# Patient Record
Sex: Female | Born: 1960 | Race: White | Hispanic: No | Marital: Married | Smoking: Former smoker
Health system: Southern US, Community
[De-identification: ages and names within clinical notes are randomized; demographics above are authoritative.]

## PROBLEM LIST (undated history)

## (undated) DIAGNOSIS — M25561 Pain in right knee: Secondary | ICD-10-CM

## (undated) DIAGNOSIS — K219 Gastro-esophageal reflux disease without esophagitis: Secondary | ICD-10-CM

## (undated) DIAGNOSIS — G894 Chronic pain syndrome: Secondary | ICD-10-CM

## (undated) DIAGNOSIS — E119 Type 2 diabetes mellitus without complications: Secondary | ICD-10-CM

## (undated) DIAGNOSIS — M25562 Pain in left knee: Secondary | ICD-10-CM

## (undated) DIAGNOSIS — M549 Dorsalgia, unspecified: Secondary | ICD-10-CM

## (undated) DIAGNOSIS — F17200 Nicotine dependence, unspecified, uncomplicated: Secondary | ICD-10-CM

## (undated) DIAGNOSIS — I700 Atherosclerosis of aorta: Secondary | ICD-10-CM

## (undated) DIAGNOSIS — Z79891 Long term (current) use of opiate analgesic: Secondary | ICD-10-CM

## (undated) DIAGNOSIS — R600 Localized edema: Secondary | ICD-10-CM

## (undated) DIAGNOSIS — D649 Anemia, unspecified: Secondary | ICD-10-CM

## (undated) DIAGNOSIS — I10 Essential (primary) hypertension: Secondary | ICD-10-CM

## (undated) DIAGNOSIS — I723 Aneurysm of iliac artery: Secondary | ICD-10-CM

## (undated) DIAGNOSIS — M1990 Unspecified osteoarthritis, unspecified site: Secondary | ICD-10-CM

## (undated) DIAGNOSIS — M4716 Other spondylosis with myelopathy, lumbar region: Secondary | ICD-10-CM

## (undated) DIAGNOSIS — M5116 Intervertebral disc disorders with radiculopathy, lumbar region: Secondary | ICD-10-CM

## (undated) HISTORY — DX: Gastro-esophageal reflux disease without esophagitis: K21.9

## (undated) HISTORY — PX: TUBAL LIGATION: SHX77

## (undated) HISTORY — DX: Type 2 diabetes mellitus without complications: E11.9

## (undated) HISTORY — PX: KNEE SURGERY: SHX244

## (undated) HISTORY — PX: BREAST BIOPSY: SHX20

## (undated) HISTORY — PX: TONSILLECTOMY: SUR1361

## (undated) HISTORY — DX: Essential (primary) hypertension: I10

## (undated) HISTORY — DX: Unspecified osteoarthritis, unspecified site: M19.90

## (undated) HISTORY — PX: CHOLECYSTECTOMY: SHX55

## (undated) HISTORY — PX: COLOSTOMY REVERSAL: SHX5782

## (undated) HISTORY — PX: COLON SURGERY: SHX602

---

## 2005-09-03 ENCOUNTER — Emergency Department: Payer: Self-pay | Admitting: Emergency Medicine

## 2005-09-15 ENCOUNTER — Ambulatory Visit: Payer: Self-pay | Admitting: Orthopedic Surgery

## 2007-05-24 ENCOUNTER — Ambulatory Visit: Payer: Self-pay | Admitting: Internal Medicine

## 2009-10-05 ENCOUNTER — Ambulatory Visit: Payer: Self-pay | Admitting: Internal Medicine

## 2012-12-27 ENCOUNTER — Ambulatory Visit: Payer: Self-pay

## 2012-12-27 LAB — RAPID INFLUENZA A&B ANTIGENS

## 2014-12-25 ENCOUNTER — Ambulatory Visit: Payer: Self-pay

## 2014-12-25 LAB — RAPID STREP-A WITH REFLX: MICRO TEXT REPORT: NEGATIVE

## 2014-12-28 LAB — BETA STREP CULTURE(ARMC)

## 2017-02-03 DIAGNOSIS — M25561 Pain in right knee: Secondary | ICD-10-CM | POA: Diagnosis not present

## 2017-02-03 DIAGNOSIS — M4716 Other spondylosis with myelopathy, lumbar region: Secondary | ICD-10-CM | POA: Diagnosis not present

## 2017-02-03 DIAGNOSIS — M5116 Intervertebral disc disorders with radiculopathy, lumbar region: Secondary | ICD-10-CM | POA: Diagnosis not present

## 2017-02-03 DIAGNOSIS — G894 Chronic pain syndrome: Secondary | ICD-10-CM | POA: Diagnosis not present

## 2017-02-03 DIAGNOSIS — M25562 Pain in left knee: Secondary | ICD-10-CM | POA: Diagnosis not present

## 2017-02-03 DIAGNOSIS — Z79899 Other long term (current) drug therapy: Secondary | ICD-10-CM | POA: Diagnosis not present

## 2017-03-31 DIAGNOSIS — M5116 Intervertebral disc disorders with radiculopathy, lumbar region: Secondary | ICD-10-CM | POA: Diagnosis not present

## 2017-03-31 DIAGNOSIS — M25562 Pain in left knee: Secondary | ICD-10-CM | POA: Diagnosis not present

## 2017-03-31 DIAGNOSIS — G894 Chronic pain syndrome: Secondary | ICD-10-CM | POA: Diagnosis not present

## 2017-03-31 DIAGNOSIS — M25561 Pain in right knee: Secondary | ICD-10-CM | POA: Diagnosis not present

## 2017-03-31 DIAGNOSIS — Z79899 Other long term (current) drug therapy: Secondary | ICD-10-CM | POA: Diagnosis not present

## 2017-03-31 DIAGNOSIS — M4716 Other spondylosis with myelopathy, lumbar region: Secondary | ICD-10-CM | POA: Diagnosis not present

## 2017-05-26 DIAGNOSIS — M5116 Intervertebral disc disorders with radiculopathy, lumbar region: Secondary | ICD-10-CM | POA: Diagnosis not present

## 2017-05-26 DIAGNOSIS — M25561 Pain in right knee: Secondary | ICD-10-CM | POA: Diagnosis not present

## 2017-05-26 DIAGNOSIS — Z79891 Long term (current) use of opiate analgesic: Secondary | ICD-10-CM | POA: Diagnosis not present

## 2017-05-26 DIAGNOSIS — M4716 Other spondylosis with myelopathy, lumbar region: Secondary | ICD-10-CM | POA: Diagnosis not present

## 2017-05-26 DIAGNOSIS — M25562 Pain in left knee: Secondary | ICD-10-CM | POA: Diagnosis not present

## 2017-05-26 DIAGNOSIS — G894 Chronic pain syndrome: Secondary | ICD-10-CM | POA: Diagnosis not present

## 2017-05-26 DIAGNOSIS — Z79899 Other long term (current) drug therapy: Secondary | ICD-10-CM | POA: Diagnosis not present

## 2017-06-30 DIAGNOSIS — M5116 Intervertebral disc disorders with radiculopathy, lumbar region: Secondary | ICD-10-CM | POA: Diagnosis not present

## 2017-07-21 DIAGNOSIS — M25561 Pain in right knee: Secondary | ICD-10-CM | POA: Diagnosis not present

## 2017-07-21 DIAGNOSIS — Z79899 Other long term (current) drug therapy: Secondary | ICD-10-CM | POA: Diagnosis not present

## 2017-07-21 DIAGNOSIS — M4716 Other spondylosis with myelopathy, lumbar region: Secondary | ICD-10-CM | POA: Diagnosis not present

## 2017-07-21 DIAGNOSIS — M25562 Pain in left knee: Secondary | ICD-10-CM | POA: Diagnosis not present

## 2017-07-21 DIAGNOSIS — M5116 Intervertebral disc disorders with radiculopathy, lumbar region: Secondary | ICD-10-CM | POA: Diagnosis not present

## 2017-07-21 DIAGNOSIS — Z79891 Long term (current) use of opiate analgesic: Secondary | ICD-10-CM | POA: Diagnosis not present

## 2017-07-21 DIAGNOSIS — G894 Chronic pain syndrome: Secondary | ICD-10-CM | POA: Diagnosis not present

## 2017-09-21 DIAGNOSIS — G894 Chronic pain syndrome: Secondary | ICD-10-CM | POA: Diagnosis not present

## 2017-09-21 DIAGNOSIS — Z79899 Other long term (current) drug therapy: Secondary | ICD-10-CM | POA: Diagnosis not present

## 2017-09-21 DIAGNOSIS — Z79891 Long term (current) use of opiate analgesic: Secondary | ICD-10-CM | POA: Diagnosis not present

## 2017-09-21 DIAGNOSIS — M5116 Intervertebral disc disorders with radiculopathy, lumbar region: Secondary | ICD-10-CM | POA: Diagnosis not present

## 2017-09-21 DIAGNOSIS — M25561 Pain in right knee: Secondary | ICD-10-CM | POA: Diagnosis not present

## 2017-09-21 DIAGNOSIS — M4716 Other spondylosis with myelopathy, lumbar region: Secondary | ICD-10-CM | POA: Diagnosis not present

## 2017-09-21 DIAGNOSIS — M25562 Pain in left knee: Secondary | ICD-10-CM | POA: Diagnosis not present

## 2017-10-20 DIAGNOSIS — M545 Low back pain: Secondary | ICD-10-CM | POA: Diagnosis not present

## 2017-11-24 DIAGNOSIS — M5116 Intervertebral disc disorders with radiculopathy, lumbar region: Secondary | ICD-10-CM | POA: Diagnosis not present

## 2017-11-24 DIAGNOSIS — M25561 Pain in right knee: Secondary | ICD-10-CM | POA: Diagnosis not present

## 2017-11-24 DIAGNOSIS — M4716 Other spondylosis with myelopathy, lumbar region: Secondary | ICD-10-CM | POA: Diagnosis not present

## 2017-11-24 DIAGNOSIS — M25562 Pain in left knee: Secondary | ICD-10-CM | POA: Diagnosis not present

## 2017-11-24 DIAGNOSIS — Z79899 Other long term (current) drug therapy: Secondary | ICD-10-CM | POA: Diagnosis not present

## 2017-11-24 DIAGNOSIS — Z79891 Long term (current) use of opiate analgesic: Secondary | ICD-10-CM | POA: Diagnosis not present

## 2017-11-24 DIAGNOSIS — G894 Chronic pain syndrome: Secondary | ICD-10-CM | POA: Diagnosis not present

## 2018-01-19 DIAGNOSIS — M4716 Other spondylosis with myelopathy, lumbar region: Secondary | ICD-10-CM | POA: Diagnosis not present

## 2018-01-19 DIAGNOSIS — Z79899 Other long term (current) drug therapy: Secondary | ICD-10-CM | POA: Diagnosis not present

## 2018-01-19 DIAGNOSIS — Z79891 Long term (current) use of opiate analgesic: Secondary | ICD-10-CM | POA: Diagnosis not present

## 2018-01-19 DIAGNOSIS — M5116 Intervertebral disc disorders with radiculopathy, lumbar region: Secondary | ICD-10-CM | POA: Diagnosis not present

## 2018-01-19 DIAGNOSIS — G894 Chronic pain syndrome: Secondary | ICD-10-CM | POA: Diagnosis not present

## 2018-01-19 DIAGNOSIS — M25562 Pain in left knee: Secondary | ICD-10-CM | POA: Diagnosis not present

## 2018-01-19 DIAGNOSIS — M25561 Pain in right knee: Secondary | ICD-10-CM | POA: Diagnosis not present

## 2018-02-02 DIAGNOSIS — M545 Low back pain: Secondary | ICD-10-CM | POA: Diagnosis not present

## 2018-03-16 DIAGNOSIS — G894 Chronic pain syndrome: Secondary | ICD-10-CM | POA: Diagnosis not present

## 2018-03-16 DIAGNOSIS — M25561 Pain in right knee: Secondary | ICD-10-CM | POA: Diagnosis not present

## 2018-03-16 DIAGNOSIS — Z79891 Long term (current) use of opiate analgesic: Secondary | ICD-10-CM | POA: Diagnosis not present

## 2018-03-16 DIAGNOSIS — M4716 Other spondylosis with myelopathy, lumbar region: Secondary | ICD-10-CM | POA: Diagnosis not present

## 2018-03-16 DIAGNOSIS — M5116 Intervertebral disc disorders with radiculopathy, lumbar region: Secondary | ICD-10-CM | POA: Diagnosis not present

## 2018-03-16 DIAGNOSIS — Z79899 Other long term (current) drug therapy: Secondary | ICD-10-CM | POA: Diagnosis not present

## 2018-03-16 DIAGNOSIS — M25562 Pain in left knee: Secondary | ICD-10-CM | POA: Diagnosis not present

## 2018-04-05 ENCOUNTER — Ambulatory Visit
Admission: EM | Admit: 2018-04-05 | Discharge: 2018-04-05 | Disposition: A | Discharge status: Home / Self Care | Payer: PPO | Attending: Emergency Medicine | Admitting: Emergency Medicine

## 2018-04-05 ENCOUNTER — Other Ambulatory Visit: Payer: Self-pay

## 2018-04-05 ENCOUNTER — Ambulatory Visit (INDEPENDENT_AMBULATORY_CARE_PROVIDER_SITE_OTHER): Payer: PPO

## 2018-04-05 DIAGNOSIS — R739 Hyperglycemia, unspecified: Secondary | ICD-10-CM

## 2018-04-05 DIAGNOSIS — R600 Localized edema: Secondary | ICD-10-CM | POA: Diagnosis not present

## 2018-04-05 DIAGNOSIS — R609 Edema, unspecified: Secondary | ICD-10-CM

## 2018-04-05 DIAGNOSIS — I10 Essential (primary) hypertension: Secondary | ICD-10-CM

## 2018-04-05 HISTORY — DX: Dorsalgia, unspecified: M54.9

## 2018-04-05 LAB — BASIC METABOLIC PANEL
ANION GAP: 8 (ref 5–15)
BUN: 7 mg/dL (ref 6–20)
CHLORIDE: 103 mmol/L (ref 101–111)
CO2: 25 mmol/L (ref 22–32)
Calcium: 8.5 mg/dL — ABNORMAL LOW (ref 8.9–10.3)
Creatinine, Ser: 0.55 mg/dL (ref 0.44–1.00)
GFR calc Af Amer: >60 mL/min (ref >60)
GLUCOSE: 282 mg/dL — AB (ref 65–99)
POTASSIUM: 3.9 mmol/L (ref 3.5–5.1)
Sodium: 136 mmol/L (ref 135–145)

## 2018-04-05 LAB — URINALYSIS, COMPLETE (UACMP) WITH MICROSCOPIC
Bilirubin Urine: NEGATIVE
Leukocytes, UA: NEGATIVE
Nitrite: NEGATIVE
PH: 5 (ref 5.0–8.0)
PROTEIN: NEGATIVE mg/dL
SPECIFIC GRAVITY, URINE: 1.025 (ref 1.005–1.030)

## 2018-04-05 LAB — CBC WITH DIFFERENTIAL/PLATELET
Basophils Absolute: 0.1 10*3/uL (ref 0–0.1)
Basophils Relative: 1 %
EOS ABS: 0.1 10*3/uL (ref 0–0.7)
EOS PCT: 2 %
HCT: 41.6 % (ref 35.0–47.0)
Hemoglobin: 14.3 g/dL (ref 12.0–16.0)
LYMPHS ABS: 1.8 10*3/uL (ref 1.0–3.6)
LYMPHS PCT: 24 %
MCH: 30 pg (ref 26.0–34.0)
MCHC: 34.4 g/dL (ref 32.0–36.0)
MCV: 87.3 fL (ref 80.0–100.0)
MONO ABS: 0.4 10*3/uL (ref 0.2–0.9)
Monocytes Relative: 6 %
Neutro Abs: 5 10*3/uL (ref 1.4–6.5)
Neutrophils Relative %: 67 %
PLATELETS: 178 10*3/uL (ref 150–440)
RBC: 4.76 MIL/uL (ref 3.80–5.20)
RDW: 13.3 % (ref 11.5–14.5)
WBC: 7.4 10*3/uL (ref 3.6–11.0)

## 2018-04-05 MED ORDER — HYDROCHLOROTHIAZIDE 25 MG PO TABS: 25.0000 mg | ORAL_TABLET | Freq: Every day | ORAL | 0 refills | Status: DC

## 2018-04-05 NOTE — Discharge Instructions (Addendum)
Try some compression stockings in addition to elevating your legs as much as possible.  You definitely need to see Dr. Ronnald Ramp in several days because I am concerned that you have diabetes in addition to high blood pressure.  Decrease your salt intake. diet and exercise will lower your blood pressure significantly. It is important to keep your blood pressure under good control, as having a elevated blood pressure for prolonged periods of time significantly increases your risk of stroke, heart attacks, kidney damage, eye damage, and other problems. Measure your blood pressure once a day, preferably at the same time every day. Keep a log of this and bring it to your next doctor's appointment.  Bring your blood pressure cuff as well.  Return here in a week for blood pressure recheck if you're unable to find a primary care physician by then. Return immediately to the ER if you start having chest pain, headache, problems seeing, problems talking, problems walking, if you feel like you're about to pass out, if you do pass out, if you have a seizure, or for any other concerns.  Go to www.goodrx.com to look up your medications. This will give you a list of where you can find your prescriptions at the most affordable prices. Or ask the pharmacist what the cash price is, or if they have any other discount programs available to help make your medication more affordable. This can be less expensive than what you would pay with insurance.

## 2018-04-05 NOTE — ED Triage Notes (Signed)
Pt with bilateral leg swelling x past 2 weeks. Redness and swelling noted up to knees. Pain 8/10

## 2018-04-05 NOTE — ED Provider Notes (Signed)
HPI  SUBJECTIVE:  Erica Ruiz is a 57 y.o. female who presents with 2 weeks of bilateral lower extremity edema, and 15 pound unintentional weight gain.  Reports bilateral lower extremity pain described as tightness by the end of the day she reports sensitivity to light touch.  She reports some erythema in her legs, no fevers, no skin breakdown.  She tried elevation with some improvement in the edema, symptoms are worse with standing for prolonged periods of time.  She denies shortness of breath, dyspnea on exertion, orthopnea, nocturia, abdominal pain, PND, cough, wheezing.  Denies change in her diet recently, states that she tries to not eat a whole lot of salt.  She denies headache, visual changes, arm or leg weakness, facial droop, dysarthria.  No chest pain, shortness of breath, pain tearing through to her back.  No hematuria, auria.  No seizures, syncope. She has a past medical history of back pain, hypertension, but has not been on any meds in quite some time.  Not sure what her blood pressure has been running at home.  She does not have a blood pressure cuff at home.  No history of CHF, nephrosis, kidney disease, diabetes, peripheral vascular disease, peripheral arterial disease.  No recent steroid use.  PMD: Dr. Ronnald Ramp at [facility].   Past Medical History:  Diagnosis Date  . Back pain     Past Surgical History:  Procedure Laterality Date  . COLON SURGERY    . COLOSTOMY REVERSAL    . KNEE SURGERY    . TONSILLECTOMY    . TUBAL LIGATION      History reviewed. No pertinent family history.  Social History   Tobacco Use  . Smoking status: Current Every Day Smoker    Packs/day: 0.50    Types: Cigarettes  . Smokeless tobacco: Never Used  Substance Use Topics  . Alcohol use: Never    Frequency: Never  . Drug use: Never    No current facility-administered medications for this encounter.   Current Outpatient Medications:  .  DULoxetine (CYMBALTA) 30 MG capsule, TK ONE C  PO ONCE A DAY FOR 90 DAYS, Disp: , Rfl: 3 .  gabapentin (NEURONTIN) 600 MG tablet, TK 1 T PO BID, Disp: , Rfl: 3 .  morphine (MS CONTIN) 15 MG 12 hr tablet, TK 1 T PO  Q 8 H FOR 30 DAYS, Disp: , Rfl: 0 .  oxyCODONE-acetaminophen (PERCOCET) 7.5-325 MG tablet, TK 1 T PO Q 8 H PRN, Disp: , Rfl: 0 .  PREVIDENT 5000 ENAMEL PROTECT 1.1-5 % PSTE, , Disp: , Rfl: 4 .  tiZANidine (ZANAFLEX) 4 MG tablet, TK 1 T PO Q 8 H PRF SPASMS, Disp: , Rfl: 3  No Known Allergies   ROS  As noted in HPI.   Physical Exam  BP (!) 172/103 (BP Location: Left Arm)   Pulse 88   Temp 98.4 F (36.9 C) (Oral)   Resp 18   Ht 5\' 1"  (1.549 m)   Wt 275 lb (124.7 kg)   SpO2 97%   BMI 51.96 kg/m    BP Readings from Last 3 Encounters:  04/05/18 (!) 172/103     Constitutional: Well developed, well nourished, no acute distress Eyes:  EOMI, conjunctiva normal bilaterally HENT: Normocephalic, atraumatic,mucus membranes moist Respiratory: Normal inspiratory effort lungs clear bilaterally.  No rales, rhonchi. Cardiovascular: Normal rate and rhythm, no murmurs, rubs, gallops.  No JVD. GI: nondistended soft, nontender.  No hepatomegaly skin: No rash, skin intact Musculoskeletal:  2+ lower extremity edema extending to the knees bilaterally with some erythema.  Skin intact.  No calf tenderness.  No deformities Neurologic: Alert & oriented x 3, no focal neuro deficits Psychiatric: Speech and behavior appropriate   ED Course   Medications - No data to display  Orders Placed This Encounter  Procedures  . DG Chest 2 View    Standing Status:   Standing    Number of Occurrences:   1    Order Specific Question:   Reason for Exam (SYMPTOM  OR DIAGNOSIS REQUIRED)    Answer:   r/o pulm edema  . CBC with Differential    Standing Status:   Standing    Number of Occurrences:   1  . Urinalysis, Complete w Microscopic    Standing Status:   Standing    Number of Occurrences:   1  . Basic metabolic panel    Standing  Status:   Standing    Number of Occurrences:   1    Results for orders placed or performed during the hospital encounter of 04/05/18 (from the past 24 hour(s))  CBC with Differential     Status: None   Collection Time: 04/05/18  2:13 PM  Result Value Ref Range   WBC 7.4 3.6 - 11.0 K/uL   RBC 4.76 3.80 - 5.20 MIL/uL   Hemoglobin 14.3 12.0 - 16.0 g/dL   HCT 41.6 35.0 - 47.0 %   MCV 87.3 80.0 - 100.0 fL   MCH 30.0 26.0 - 34.0 pg   MCHC 34.4 32.0 - 36.0 g/dL   RDW 13.3 11.5 - 14.5 %   Platelets 178 150 - 440 K/uL   Neutrophils Relative % 67 %   Neutro Abs 5.0 1.4 - 6.5 K/uL   Lymphocytes Relative 24 %   Lymphs Abs 1.8 1.0 - 3.6 K/uL   Monocytes Relative 6 %   Monocytes Absolute 0.4 0.2 - 0.9 K/uL   Eosinophils Relative 2 %   Eosinophils Absolute 0.1 0 - 0.7 K/uL   Basophils Relative 1 %   Basophils Absolute 0.1 0 - 0.1 K/uL  Urinalysis, Complete w Microscopic     Status: Abnormal   Collection Time: 04/05/18  2:13 PM  Result Value Ref Range   Color, Urine YELLOW YELLOW   APPearance HAZY (A) CLEAR   Specific Gravity, Urine 1.025 1.005 - 1.030   pH 5.0 5.0 - 8.0   Glucose, UA >1000 (A) NEGATIVE mg/dL   Hgb urine dipstick TRACE (A) NEGATIVE   Bilirubin Urine NEGATIVE NEGATIVE   Ketones, ur TRACE (A) NEGATIVE mg/dL   Protein, ur NEGATIVE NEGATIVE mg/dL   Nitrite NEGATIVE NEGATIVE   Leukocytes, UA NEGATIVE NEGATIVE   Squamous Epithelial / LPF 0-5 (A) NONE SEEN   WBC, UA 0-5 0 - 5 WBC/hpf   RBC / HPF 6-30 0 - 5 RBC/hpf   Bacteria, UA FEW (A) NONE SEEN  Basic metabolic panel     Status: Abnormal   Collection Time: 04/05/18  2:13 PM  Result Value Ref Range   Sodium 136 135 - 145 mmol/L   Potassium 3.9 3.5 - 5.1 mmol/L   Chloride 103 101 - 111 mmol/L   CO2 25 22 - 32 mmol/L   Glucose, Bld 282 (H) 65 - 99 mg/dL   BUN 7 6 - 20 mg/dL   Creatinine, Ser 0.55 0.44 - 1.00 mg/dL   Calcium 8.5 (L) 8.9 - 10.3 mg/dL   GFR calc non Af Amer >60 >60  mL/min   GFR calc Af Amer >60 >60  mL/min   Anion gap 8 5 - 15   Dg Chest 2 View  Result Date: 04/05/2018 CLINICAL DATA:  Lower extremity edema for the past 2 weeks. Current smoker. EXAM: CHEST - 2 VIEW COMPARISON:  None in PACs FINDINGS: The lungs are well-expanded. The pulmonary vascularity is normal and the interstitial markings are not abnormally increased. The heart is normal in size. The mediastinum is normal in width. The bony thorax is unremarkable. There is no pleural effusion. IMPRESSION: There is no evidence of CHF or noncardiac pulmonary edema nor other acute cardiopulmonary disease. Electronically Signed   By: David  Martinique M.D.   On: 04/05/2018 14:37    ED Clinical Impression  Peripheral edema  Hyperglycemia  Hypertension, unspecified type   ED Assessment/Plan  Will check a UA, CBC, BMP and a chest x-ray.  If All of these are normal, will plan to start on 25 mg of hydrochlorothiazide for blood pressure control and partial diuresis.  Sent Dr. Ronnald Ramp, her primary care physician, a note to have her followed up in several days.  Advised patient to buy a blood pressure cuff, and keep a log of her blood pressure, and bring it with her to her follow-up visit.  Discussed importance of having her blood pressure under control, and signs and symptoms of hypertensive emergency.   Chest x-ray independently reviewed.  No CHF, pulmonary edema.  Normal chest x-ray.  See radiology report for details.  Labs reviewed.  Kidney function electrolytes normal.  She is hyperglycemic.  She denies any signs of hyperglycemia.  She states that she is not a diabetic.  She is spilling a great deal of sugar into her urine, but she has no proteinuria.  Trace ketones but not acidotic.  Doubt DKA.  Advised her that she will need to undergo further workup with Dr. Ronnald Ramp regarding this.  Discussed labs, imaging, MDM, treatment plan, and plan for follow-up with patient Discussed sn/sx that should prompt return to the ED. patient agrees with plan.    No orders of the defined types were placed in this encounter.   *This clinic note was created using Dragon dictation software. Therefore, there may be occasional mistakes despite careful proofreading.   ?   Melynda Ripple, MD 04/05/18 [phone]

## 2018-04-16 ENCOUNTER — Ambulatory Visit (INDEPENDENT_AMBULATORY_CARE_PROVIDER_SITE_OTHER): Payer: PPO | Admitting: Family Medicine

## 2018-04-16 ENCOUNTER — Encounter: Payer: Self-pay | Admitting: Family Medicine

## 2018-04-16 VITALS — BP 150/100 | HR 98 | Ht 61.0 in | Wt 270.0 lb

## 2018-04-16 DIAGNOSIS — I10 Essential (primary) hypertension: Secondary | ICD-10-CM | POA: Diagnosis not present

## 2018-04-16 DIAGNOSIS — R739 Hyperglycemia, unspecified: Secondary | ICD-10-CM | POA: Diagnosis not present

## 2018-04-16 DIAGNOSIS — I872 Venous insufficiency (chronic) (peripheral): Secondary | ICD-10-CM | POA: Diagnosis not present

## 2018-04-16 DIAGNOSIS — Z7689 Persons encountering health services in other specified circumstances: Secondary | ICD-10-CM | POA: Diagnosis not present

## 2018-04-16 DIAGNOSIS — R635 Abnormal weight gain: Secondary | ICD-10-CM | POA: Insufficient documentation

## 2018-04-16 MED ORDER — HYDROCHLOROTHIAZIDE 25 MG PO TABS: 25.0000 mg | ORAL_TABLET | Freq: Every day | ORAL | 3 refills | Status: DC

## 2018-04-16 MED ORDER — LISINOPRIL 10 MG PO TABS: 10.0000 mg | ORAL_TABLET | Freq: Every day | ORAL | 3 refills | Status: DC

## 2018-04-16 NOTE — Progress Notes (Signed)
Name: Erica Ruiz   MRN: [id number]    DOB: [birth year]   Date:04/16/2018       Progress Note  Subjective  Chief Complaint  Chief Complaint  Patient presents with  . Hypertension  . Hyperglycemia  . hepatitis C screen    had 2 siblings have hepatitis C    Hypertension  This is a new problem. The current episode started more than 1 month ago. The problem has been gradually improving (since initiating diuretic) since onset. The problem is uncontrolled. Associated symptoms include malaise/fatigue, peripheral edema, shortness of breath and sweats. Pertinent negatives include no anxiety, blurred vision, chest pain, headaches, neck pain, orthopnea, palpitations or PND. There are no associated agents to hypertension. Past treatments include diuretics. The current treatment provides moderate improvement. There are no compliance problems.  There is no history of angina, kidney disease, CAD/MI, CVA, heart failure, left ventricular hypertrophy, PVD or retinopathy. There is no history of chronic renal disease, a hypertension causing med or renovascular disease.  Hyperglycemia  Pertinent negatives include no abdominal pain, chest pain, chills, coughing, fever, headaches, myalgias, nausea, neck pain, rash or sore throat.    No problem-specific Assessment & Plan notes found for this encounter.   Past Medical History:  Diagnosis Date  . Back pain   . Hypertension     Past Surgical History:  Procedure Laterality Date  . CHOLECYSTECTOMY    . COLON SURGERY    . COLOSTOMY REVERSAL    . KNEE SURGERY    . TONSILLECTOMY    . TUBAL LIGATION      Family History  Problem Relation Age of Onset  . Cancer Father   . Heart disease Father   . Hypertension Father   . Cancer Sister   . Hypertension Sister   . Cancer Brother   . Diabetes Brother   . Heart disease Brother   . Hypertension Brother     Social History   Socioeconomic History  . Marital status: Married    Spouse name: Not on  file  . Number of children: Not on file  . Years of education: Not on file  . Highest education level: Not on file  Occupational History  . Not on file  Social Needs  . Financial resource strain: Not on file  . Food insecurity:    Worry: Not on file    Inability: Not on file  . Transportation needs:    Medical: Not on file    Non-medical: Not on file  Tobacco Use  . Smoking status: Current Every Day Smoker    Packs/day: 0.50    Types: Cigarettes  . Smokeless tobacco: Never Used  Substance and Sexual Activity  . Alcohol use: Never    Frequency: Never  . Drug use: Never  . Sexual activity: Not on file  Lifestyle  . Physical activity:    Days per week: Not on file    Minutes per session: Not on file  . Stress: Not on file  Relationships  . Social connections:    Talks on phone: Not on file    Gets together: Not on file    Attends religious service: Not on file    Active member of club or organization: Not on file    Attends meetings of clubs or organizations: Not on file    Relationship status: Not on file  . Intimate partner violence:    Fear of current or ex partner: Not on file    Emotionally abused:  Not on file    Physically abused: Not on file    Forced sexual activity: Not on file  Other Topics Concern  . Not on file  Social History Narrative  . Not on file    No Known Allergies  Outpatient Medications Prior to Visit  Medication Sig Dispense Refill  . DULoxetine (CYMBALTA) 30 MG capsule TK ONE C PO ONCE A DAY FOR 90 DAYS- pain management  3  . gabapentin (NEURONTIN) 600 MG tablet TK 1 T PO BID- pain management  3  . hydrochlorothiazide (HYDRODIURIL) 25 MG tablet Take 1 tablet (25 mg total) by mouth daily. 30 tablet 0  . morphine (MS CONTIN) 15 MG 12 hr tablet TK 1 T PO  Q 8 H FOR 30 DAYS- pain management  0  . oxyCODONE-acetaminophen (PERCOCET) 7.5-325 MG tablet TK 1 T PO Q 8 H PRN- pain management  0  . PREVIDENT 5000 ENAMEL PROTECT 1.1-5 % PSTE   4  .  tiZANidine (ZANAFLEX) 4 MG tablet TK 1 T PO Q 8 H PRF SPASMS- pain management  3   No facility-administered medications prior to visit.     Review of Systems  Constitutional: Positive for malaise/fatigue. Negative for chills, fever and weight loss.  HENT: Negative for ear discharge, ear pain and sore throat.   Eyes: Negative for blurred vision.  Respiratory: Positive for shortness of breath. Negative for cough, sputum production and wheezing.   Cardiovascular: Negative for chest pain, palpitations, orthopnea, leg swelling and PND.  Gastrointestinal: Negative for abdominal pain, blood in stool, constipation, diarrhea, heartburn, melena and nausea.  Genitourinary: Negative for dysuria, frequency, hematuria and urgency.  Musculoskeletal: Negative for back pain, joint pain, myalgias and neck pain.  Skin: Negative for rash.  Neurological: Negative for dizziness, tingling, sensory change, focal weakness and headaches.  Endo/Heme/Allergies: Negative for environmental allergies and polydipsia. Does not bruise/bleed easily.  Psychiatric/Behavioral: Negative for depression and suicidal ideas. The patient is not nervous/anxious and does not have insomnia.      Objective  Vitals:   04/16/18 1443  BP: (!) 150/100  Pulse: 98  Weight: 270 lb (122.5 kg)  Height: 5\' 1"  (1.549 m)    Physical Exam  Constitutional: She is oriented to person, place, and time. She appears well-developed and well-nourished.  HENT:  Head: Normocephalic.  Right Ear: External ear normal.  Left Ear: External ear normal.  Mouth/Throat: Oropharynx is clear and moist.  Eyes: Pupils are equal, round, and reactive to light. Conjunctivae and EOM are normal. Lids are everted and swept, no foreign bodies found. Left eye exhibits no hordeolum. No foreign body present in the left eye. Right conjunctiva is not injected. Left conjunctiva is not injected. No scleral icterus.  Neck: Normal range of motion. Neck supple. No JVD present.  No tracheal deviation present. No thyromegaly present.  Cardiovascular: Normal rate, regular rhythm, S1 normal, S2 normal, normal heart sounds and intact distal pulses. PMI is not displaced. Exam reveals no gallop, no S3, no S4 and no friction rub.  No murmur heard. 2 + edema  Pulmonary/Chest: Effort normal and breath sounds normal. No respiratory distress. She has no wheezes. She has no rales.  Abdominal: Soft. Bowel sounds are normal. She exhibits no mass. There is no hepatosplenomegaly. There is no tenderness. There is no rebound and no guarding.  Musculoskeletal: Normal range of motion. She exhibits no edema or tenderness.  Lymphadenopathy:    She has no cervical adenopathy.  Neurological: She is alert and  oriented to person, place, and time. She has normal strength. She displays normal reflexes. No cranial nerve deficit.  Skin: Skin is warm. No rash noted.  Psychiatric: She has a normal mood and affect. Her mood appears not anxious. She does not exhibit a depressed mood.  Nursing note and vitals reviewed.     Assessment & Plan  Problem List Items Addressed This Visit      Cardiovascular and Mediastinum   Essential hypertension   Relevant Medications   lisinopril (PRINIVIL,ZESTRIL) 10 MG tablet   hydrochlorothiazide (HYDRODIURIL) 25 MG tablet   Other Relevant Orders   Renal Function Panel   Venous insufficiency   Relevant Medications   lisinopril (PRINIVIL,ZESTRIL) 10 MG tablet   hydrochlorothiazide (HYDRODIURIL) 25 MG tablet     Other   Hyperglycemia   Relevant Orders   Hemoglobin A1c   Abnormal weight gain   Relevant Orders   TSH    Other Visit Diagnoses    Establishing care with new doctor, encounter for    -  Primary      Meds ordered this encounter  Medications  . lisinopril (PRINIVIL,ZESTRIL) 10 MG tablet    Sig: Take 1 tablet (10 mg total) by mouth daily.    Dispense:  90 tablet    Refill:  3  . hydrochlorothiazide (HYDRODIURIL) 25 MG tablet    Sig:  Take 1 tablet (25 mg total) by mouth daily.    Dispense:  90 tablet    Refill:  3      Dr. Otilio Miu [facility]  04/16/18

## 2018-04-17 LAB — RENAL FUNCTION PANEL
Albumin: 4.3 g/dL (ref 3.5–5.5)
BUN / CREAT RATIO: 11 (ref 9–23)
BUN: 8 mg/dL (ref 6–24)
CHLORIDE: 91 mmol/L — AB (ref 96–106)
CO2: 25 mmol/L (ref 20–29)
Calcium: 9.4 mg/dL (ref 8.7–10.2)
Creatinine, Ser: 0.73 mg/dL (ref 0.57–1.00)
GFR calc Af Amer: 107 mL/min/{1.73_m2} (ref >59)
GFR calc non Af Amer: 93 mL/min/{1.73_m2} (ref >59)
Glucose: 387 mg/dL — ABNORMAL HIGH (ref 65–99)
Phosphorus: 3.8 mg/dL (ref 2.5–4.5)
Potassium: 3.7 mmol/L (ref 3.5–5.2)
SODIUM: 134 mmol/L (ref 134–144)

## 2018-04-17 LAB — TSH: TSH: 0.852 u[IU]/mL (ref 0.450–4.500)

## 2018-04-17 LAB — HEMOGLOBIN A1C
ESTIMATED AVERAGE GLUCOSE: 266 mg/dL
HEMOGLOBIN A1C: 10.9 % — AB (ref 4.8–5.6)

## 2018-04-20 ENCOUNTER — Other Ambulatory Visit: Payer: Self-pay

## 2018-04-20 ENCOUNTER — Other Ambulatory Visit: Payer: Self-pay | Admitting: Family Medicine

## 2018-04-20 DIAGNOSIS — E119 Type 2 diabetes mellitus without complications: Secondary | ICD-10-CM

## 2018-04-20 MED ORDER — METFORMIN HCL 500 MG PO TABS: 500.0000 mg | ORAL_TABLET | Freq: Two times a day (BID) | ORAL | 0 refills | Status: DC

## 2018-05-11 DIAGNOSIS — M4716 Other spondylosis with myelopathy, lumbar region: Secondary | ICD-10-CM | POA: Diagnosis not present

## 2018-05-11 DIAGNOSIS — M25561 Pain in right knee: Secondary | ICD-10-CM | POA: Diagnosis not present

## 2018-05-11 DIAGNOSIS — Z79899 Other long term (current) drug therapy: Secondary | ICD-10-CM | POA: Diagnosis not present

## 2018-05-11 DIAGNOSIS — Z79891 Long term (current) use of opiate analgesic: Secondary | ICD-10-CM | POA: Diagnosis not present

## 2018-05-11 DIAGNOSIS — M5116 Intervertebral disc disorders with radiculopathy, lumbar region: Secondary | ICD-10-CM | POA: Diagnosis not present

## 2018-05-11 DIAGNOSIS — M25562 Pain in left knee: Secondary | ICD-10-CM | POA: Diagnosis not present

## 2018-05-11 DIAGNOSIS — G894 Chronic pain syndrome: Secondary | ICD-10-CM | POA: Diagnosis not present

## 2018-05-14 ENCOUNTER — Telehealth: Payer: Self-pay | Admitting: Family Medicine

## 2018-05-14 NOTE — Telephone Encounter (Signed)
Called to schedule Medicare Annual Wellness Visit with Nurse Health Advisor. If patient returns call, please schedule AWV-i with NHA any date   Thank you! For any questions please contact: Jill Alexanders [phone]  Skype [email or web address]

## 2018-05-28 ENCOUNTER — Ambulatory Visit (INDEPENDENT_AMBULATORY_CARE_PROVIDER_SITE_OTHER): Payer: PPO | Admitting: Family Medicine

## 2018-05-28 ENCOUNTER — Encounter: Payer: Self-pay | Admitting: Family Medicine

## 2018-05-28 VITALS — BP 130/88 | HR 80 | Ht 61.0 in | Wt 266.0 lb

## 2018-05-28 DIAGNOSIS — I10 Essential (primary) hypertension: Secondary | ICD-10-CM | POA: Diagnosis not present

## 2018-05-28 DIAGNOSIS — F17200 Nicotine dependence, unspecified, uncomplicated: Secondary | ICD-10-CM

## 2018-05-28 DIAGNOSIS — E119 Type 2 diabetes mellitus without complications: Secondary | ICD-10-CM

## 2018-05-28 MED ORDER — HYDROCHLOROTHIAZIDE 25 MG PO TABS: 25.0000 mg | ORAL_TABLET | Freq: Every day | ORAL | 1 refills | Status: DC

## 2018-05-28 MED ORDER — METFORMIN HCL 500 MG PO TABS: ORAL_TABLET | ORAL | 1 refills | Status: DC

## 2018-05-28 MED ORDER — LISINOPRIL 10 MG PO TABS: 10.0000 mg | ORAL_TABLET | Freq: Every day | ORAL | 3 refills | Status: DC

## 2018-05-28 NOTE — Patient Instructions (Signed)

## 2018-05-28 NOTE — Assessment & Plan Note (Signed)
Controlled. Will continue lisinopriil 10 mg and HCTZ 25mg  and check renal panel.

## 2018-05-28 NOTE — Progress Notes (Signed)
Name: Erica Ruiz   MRN: [id number]    DOB: 1961-07-05   Date:05/28/2018       Progress Note  Subjective  Chief Complaint  Chief Complaint  Patient presents with  . Diabetes    follow up- will be seeing endo on Monday    Diabetes  She presents for her follow-up diabetic visit. She has type 2 diabetes mellitus. Her disease course has been improving. There are no hypoglycemic associated symptoms. Pertinent negatives for hypoglycemia include no dizziness, headaches or nervousness/anxiousness. Associated symptoms include fatigue, foot paresthesias, polydipsia, polyphagia, polyuria and weight loss. Pertinent negatives for diabetes include no blurred vision, no chest pain, no foot ulcerations, no visual change and no weakness. There are no hypoglycemic complications. Symptoms are stable. There are no diabetic complications. There are no known risk factors for coronary artery disease. She is compliant with treatment all of the time. Her weight is decreasing steadily. She is following a generally healthy diet. Meal planning includes avoidance of concentrated sweets and carbohydrate counting. She participates in exercise intermittently. An ACE inhibitor/angiotensin II receptor blocker is being taken. Eye exam is not current (upcoming).    Essential hypertension Controlled. Will continue lisinopriil 10 mg and HCTZ 25mg  and check renal panel.   Past Medical History:  Diagnosis Date  . Back pain   . Hypertension     Past Surgical History:  Procedure Laterality Date  . CHOLECYSTECTOMY    . COLON SURGERY    . COLOSTOMY REVERSAL    . KNEE SURGERY    . TONSILLECTOMY    . TUBAL LIGATION      Family History  Problem Relation Age of Onset  . Cancer Father   . Heart disease Father   . Hypertension Father   . Cancer Sister   . Hypertension Sister   . Cancer Brother   . Diabetes Brother   . Heart disease Brother   . Hypertension Brother     Social History   Socioeconomic History   . Marital status: Married    Spouse name: Not on file  . Number of children: Not on file  . Years of education: Not on file  . Highest education level: Not on file  Occupational History  . Not on file  Social Needs  . Financial resource strain: Not on file  . Food insecurity:    Worry: Not on file    Inability: Not on file  . Transportation needs:    Medical: Not on file    Non-medical: Not on file  Tobacco Use  . Smoking status: Current Every Day Smoker    Packs/day: 0.50    Types: Cigarettes  . Smokeless tobacco: Never Used  Substance and Sexual Activity  . Alcohol use: Never    Frequency: Never  . Drug use: Never  . Sexual activity: Not on file  Lifestyle  . Physical activity:    Days per week: Not on file    Minutes per session: Not on file  . Stress: Not on file  Relationships  . Social connections:    Talks on phone: Not on file    Gets together: Not on file    Attends religious service: Not on file    Active member of club or organization: Not on file    Attends meetings of clubs or organizations: Not on file    Relationship status: Not on file  . Intimate partner violence:    Fear of current or ex partner: Not on file  Emotionally abused: Not on file    Physically abused: Not on file    Forced sexual activity: Not on file  Other Topics Concern  . Not on file  Social History Narrative  . Not on file    No Known Allergies  Outpatient Medications Prior to Visit  Medication Sig Dispense Refill  . DULoxetine (CYMBALTA) 30 MG capsule TK ONE C PO ONCE A DAY FOR 90 DAYS- pain management  3  . gabapentin (NEURONTIN) 600 MG tablet TK 1 T PO BID- pain management  3  . morphine (MS CONTIN) 15 MG 12 hr tablet TK 1 T PO  Q 8 H FOR 30 DAYS- pain management  0  . oxyCODONE-acetaminophen (PERCOCET) 7.5-325 MG tablet TK 1 T PO Q 8 H PRN- pain management  0  . PREVIDENT 5000 ENAMEL PROTECT 1.1-5 % PSTE   4  . tiZANidine (ZANAFLEX) 4 MG tablet TK 1 T PO Q 8 H PRF  SPASMS- pain management  3  . hydrochlorothiazide (HYDRODIURIL) 25 MG tablet Take 1 tablet (25 mg total) by mouth daily. 30 tablet 0  . lisinopril (PRINIVIL,ZESTRIL) 10 MG tablet Take 1 tablet (10 mg total) by mouth daily. 90 tablet 3  . metFORMIN (GLUCOPHAGE) 500 MG tablet TAKE 1 TABLET(500 MG) BY MOUTH TWICE DAILY WITH A MEAL 60 tablet 1  . hydrochlorothiazide (HYDRODIURIL) 25 MG tablet Take 1 tablet (25 mg total) by mouth daily. 90 tablet 3   No facility-administered medications prior to visit.     Review of Systems  Constitutional: Positive for fatigue and weight loss. Negative for chills, fever and malaise/fatigue.  HENT: Negative for ear discharge, ear pain and sore throat.   Eyes: Negative for blurred vision.  Respiratory: Negative for cough, sputum production, shortness of breath and wheezing.   Cardiovascular: Negative for chest pain, palpitations and leg swelling.  Gastrointestinal: Negative for abdominal pain, blood in stool, constipation, diarrhea, heartburn, melena and nausea.  Genitourinary: Negative for dysuria, frequency, hematuria and urgency.  Musculoskeletal: Negative for back pain, joint pain, myalgias and neck pain.  Skin: Negative for rash.  Neurological: Negative for dizziness, tingling, sensory change, focal weakness, weakness and headaches.  Endo/Heme/Allergies: Positive for polydipsia and polyphagia. Negative for environmental allergies. Does not bruise/bleed easily.  Psychiatric/Behavioral: Negative for depression and suicidal ideas. The patient is not nervous/anxious and does not have insomnia.      Objective  Vitals:   05/28/18 0911  BP: 130/88  Pulse: 80  Weight: 266 lb (120.7 kg)  Height: 5\' 1"  (1.549 m)    Physical Exam  Constitutional: She is oriented to person, place, and time. She appears well-developed and well-nourished.  HENT:  Head: Normocephalic.  Right Ear: External ear normal.  Left Ear: External ear normal.  Mouth/Throat: Oropharynx  is clear and moist.  Eyes: Pupils are equal, round, and reactive to light. Conjunctivae and EOM are normal. Lids are everted and swept, no foreign bodies found. Left eye exhibits no hordeolum. No foreign body present in the left eye. Right conjunctiva is not injected. Left conjunctiva is not injected. No scleral icterus.  Neck: Normal range of motion. Neck supple. No JVD present. No tracheal deviation present. No thyromegaly present.  Cardiovascular: Normal rate, regular rhythm, S1 normal, S2 normal, normal heart sounds and intact distal pulses. Exam reveals no gallop, no S3, no S4 and no friction rub.  No murmur heard.  No systolic murmur is present.  No diastolic murmur is present. Pulmonary/Chest: Effort normal and breath sounds  normal. No respiratory distress. She has no wheezes. She has no rales.  Abdominal: Soft. Bowel sounds are normal. She exhibits no mass. There is no hepatosplenomegaly. There is no tenderness. There is no rebound and no guarding.  Musculoskeletal: Normal range of motion. She exhibits no edema or tenderness.  Lymphadenopathy:    She has no cervical adenopathy.  Neurological: She is alert and oriented to person, place, and time. She has normal strength. She displays normal reflexes. No cranial nerve deficit.  Skin: Skin is warm. No rash noted.  Psychiatric: She has a normal mood and affect. Her mood appears not anxious. She does not exhibit a depressed mood.  Nursing note and vitals reviewed.     Assessment & Plan  Problem List Items Addressed This Visit      Cardiovascular and Mediastinum   Essential hypertension    Controlled. Will continue lisinopriil 10 mg and HCTZ 25mg  and check renal panel.      Relevant Medications   lisinopril (PRINIVIL,ZESTRIL) 10 MG tablet   hydrochlorothiazide (HYDRODIURIL) 25 MG tablet   Other Relevant Orders   Renal Function Panel    Other Visit Diagnoses    Type 2 diabetes mellitus without complication, without long-term  current use of insulin ([place])    -  Primary   Newly diagnosed. On metformin and diet for 6 weeks. Will check A1C(last >10)  microalbumin, andrenalpanel. will initiate endocrine consultation.   Relevant Medications   lisinopril (PRINIVIL,ZESTRIL) 10 MG tablet   metFORMIN (GLUCOPHAGE) 500 MG tablet   Other Relevant Orders   Microalbumin / creatinine urine ratio   Renal Function Panel   Hemoglobin A1c   Tobacco dependence       Smoking cessation discussed.    Patient has been advised of the health risks of smoking and counseled concerning cessation of tobacco products. I spent over 3 minutes for discussion and to answer questions.  Meds ordered this encounter  Medications  . lisinopril (PRINIVIL,ZESTRIL) 10 MG tablet    Sig: Take 1 tablet (10 mg total) by mouth daily.    Dispense:  90 tablet    Refill:  3  . metFORMIN (GLUCOPHAGE) 500 MG tablet    Sig: TAKE 1 TABLET(500 MG) BY MOUTH TWICE DAILY WITH A MEAL    Dispense:  180 tablet    Refill:  1    Pt is a new diabetic/ will need recheck on med- may not keep her on this  . hydrochlorothiazide (HYDRODIURIL) 25 MG tablet    Sig: Take 1 tablet (25 mg total) by mouth daily.    Dispense:  90 tablet    Refill:  1      Dr. Otilio Miu [facility]  05/28/18

## 2018-05-29 LAB — RENAL FUNCTION PANEL
Albumin: 4.3 g/dL (ref 3.5–5.5)
BUN/Creatinine Ratio: 14 (ref 9–23)
BUN: 10 mg/dL (ref 6–24)
CALCIUM: 9.2 mg/dL (ref 8.7–10.2)
CHLORIDE: 99 mmol/L (ref 96–106)
CO2: 24 mmol/L (ref 20–29)
CREATININE: 0.7 mg/dL (ref 0.57–1.00)
GFR calc Af Amer: 113 mL/min/{1.73_m2} (ref >59)
GFR calc non Af Amer: 98 mL/min/{1.73_m2} (ref >59)
GLUCOSE: 155 mg/dL — AB (ref 65–99)
PHOSPHORUS: 3.2 mg/dL (ref 2.5–4.5)
POTASSIUM: 4.5 mmol/L (ref 3.5–5.2)
SODIUM: 141 mmol/L (ref 134–144)

## 2018-05-29 LAB — HEMOGLOBIN A1C
Est. average glucose Bld gHb Est-mCnc: 232 mg/dL
HEMOGLOBIN A1C: 9.7 % — AB (ref 4.8–5.6)

## 2018-05-29 LAB — MICROALBUMIN / CREATININE URINE RATIO
Creatinine, Urine: 72.6 mg/dL
MICROALB/CREAT RATIO: 5.5 mg/g{creat} (ref 0.0–30.0)
Microalbumin, Urine: 4 ug/mL

## 2018-05-31 ENCOUNTER — Other Ambulatory Visit: Payer: Self-pay

## 2018-05-31 DIAGNOSIS — H401131 Primary open-angle glaucoma, bilateral, mild stage: Secondary | ICD-10-CM | POA: Diagnosis not present

## 2018-05-31 DIAGNOSIS — H2513 Age-related nuclear cataract, bilateral: Secondary | ICD-10-CM | POA: Diagnosis not present

## 2018-05-31 MED ORDER — EMPAGLIFLOZIN 10 MG PO TABS: 10.0000 mg | ORAL_TABLET | Freq: Every day | ORAL | 1 refills | Status: DC

## 2018-06-01 DIAGNOSIS — M545 Low back pain: Secondary | ICD-10-CM | POA: Diagnosis not present

## 2018-06-06 LAB — HM DIABETES EYE EXAM

## 2018-06-16 DIAGNOSIS — E1165 Type 2 diabetes mellitus with hyperglycemia: Secondary | ICD-10-CM | POA: Diagnosis not present

## 2018-06-16 DIAGNOSIS — F17200 Nicotine dependence, unspecified, uncomplicated: Secondary | ICD-10-CM | POA: Diagnosis not present

## 2018-06-16 DIAGNOSIS — I10 Essential (primary) hypertension: Secondary | ICD-10-CM | POA: Diagnosis not present

## 2018-06-28 ENCOUNTER — Encounter: Payer: PPO | Attending: Surgery | Admitting: *Deleted

## 2018-06-28 ENCOUNTER — Encounter: Payer: Self-pay | Admitting: *Deleted

## 2018-06-28 VITALS — BP 112/76 | Ht 61.0 in | Wt 255.2 lb

## 2018-06-28 DIAGNOSIS — Z713 Dietary counseling and surveillance: Secondary | ICD-10-CM | POA: Diagnosis not present

## 2018-06-28 DIAGNOSIS — E119 Type 2 diabetes mellitus without complications: Secondary | ICD-10-CM

## 2018-06-28 NOTE — Patient Instructions (Signed)
Check blood sugars 2 x day before breakfast and 2 hrs after supper every day Bring blood sugar records to the next class  Exercise: Walk as tolerated   Eat 3 meals day, 1-2 snacks a day Space meals 4-6 hours apart Don't skip meals  Return for classes on:

## 2018-06-29 NOTE — Progress Notes (Signed)
Diabetes Self-Management Education  Visit Type: First/Initial  Appt. Start Time: 1610 Appt. End Time: 9702  06/28/2018  [name], identified by name and date of birth, is a 57 y.o. female with a diagnosis of Diabetes: Type 2.   ASSESSMENT  Blood pressure 112/76, height 5\' 1"  (1.549 m), weight 255 lb 3.2 oz (115.8 kg). Body mass index is 48.22 kg/m.  Diabetes Self-Management Education - 06/28/18 1735      Visit Information   Visit Type  First/Initial      Initial Visit   Diabetes Type  Type 2    Are you currently following a meal plan?  Yes    What type of meal plan do you follow?  "stopped drinking regular Pepsi"    Are you taking your medications as prescribed?  Yes    Date Diagnosed  2 months ago      Health Coping   How would you rate your overall health?  Fair      Psychosocial Assessment   Patient Belief/Attitude about Diabetes  Motivated to manage diabetes    Self-care barriers  None    Self-management support  Doctor's office    Patient Concerns  Nutrition/Meal planning;Glycemic Control    Special Needs  None    Preferred Learning Style  Auditory;Visual;Hands on    [place] in progress    How often do you need to have someone help you when you read instructions, pamphlets, or other written materials from your doctor or pharmacy?  1 - Never      Pre-Education Assessment   Patient understands the diabetes disease and treatment process.  Needs Instruction    Patient understands incorporating nutritional management into lifestyle.  Needs Instruction    Patient undertands incorporating physical activity into lifestyle.  Needs Instruction    Patient understands using medications safely.  Needs Instruction    Patient understands monitoring blood glucose, interpreting and using results  Needs Review    Patient understands prevention, detection, and treatment of acute complications.  Needs Instruction    Patient understands prevention,  detection, and treatment of chronic complications.  Needs Instruction    Patient understands how to develop strategies to address psychosocial issues.  Needs Instruction    Patient understands how to develop strategies to promote health/change behavior.  Needs Instruction      Complications   Last HgB A1C per patient/outside source  9.7 % 05/28/18    How often do you check your blood sugar?  1-2 times/day    Fasting Blood glucose range (mg/dL)  70-129 Pt reports FBG's 99-124 mg/dL    Postprandial Blood glucose range (mg/dL)  130-179 Pt reports pp's 150-175 mg/dL    Have you had a dilated eye exam in the past 12 months?  Yes    Have you had a dental exam in the past 12 months?  Yes    Are you checking your feet?  Yes    How many days per week are you checking your feet?  7      Dietary Intake   Breakfast  skips    Lunch  bacon, sausage, grits; pancake on a stick or waffle and fruit; tomato sandwich    Dinner  baked meat - chicken, pork, beef, fish with bread, potatoes, beans, corn, occasional rice, lettuce    Snack (evening)  peanut butter crackers, fruit    Beverage(s)  water, diet Mt Dew      Exercise   Exercise Type  ADL's      Patient Education   Previous Diabetes Education  No    Disease state   Definition of diabetes, type 1 and 2, and the diagnosis of diabetes    Nutrition management   Role of diet in the treatment of diabetes and the relationship between the three main macronutrients and blood glucose level;Reviewed blood glucose goals for pre and post meals and how to evaluate the patients' food intake on their blood glucose level.    Physical activity and exercise   Role of exercise on diabetes management, blood pressure control and cardiac health.    Medications  Reviewed patients medication for diabetes, action, purpose, timing of dose and side effects.    Monitoring  Purpose and frequency of SMBG.;Taught/discussed recording of test results and interpretation of  SMBG.;Identified appropriate SMBG and/or A1C goals.    Chronic complications  Relationship between chronic complications and blood glucose control    Psychosocial adjustment  Identified and addressed patients feelings and concerns about diabetes    Personal strategies to promote health  Review risk of smoking and offered smoking cessation      Individualized Goals (developed by patient)   Reducing Risk  Improve blood sugars     Outcomes   Expected Outcomes  Demonstrated interest in learning. Expect positive outcomes    Future DMSE  2 months       Individualized Plan for Diabetes Self-Management Training:   Learning Objective:  Patient will have a greater understanding of diabetes self-management. Patient education plan is to attend individual and/or group sessions per assessed needs and concerns.   Plan:   Patient Instructions  Check blood sugars 2 x day before breakfast and 2 hrs after supper every day Bring blood sugar records to the next class Exercise: Walk as tolerated  Eat 3 meals day, 1-2 snacks a day Space meals 4-6 hours apart Don't skip meals   Expected Outcomes:  Demonstrated interest in learning. Expect positive outcomes  Education material provided:  General Meal Planning Guidelines Simple Meal Plan  If problems or questions, patient to contact team via:  Johny Drilling, [place], [place], CDE [phone]  Future DSME appointment: 2 months  Due to vacation she will attend Diabetes classes beginning Sept 9, 2019

## 2018-07-06 DIAGNOSIS — M25561 Pain in right knee: Secondary | ICD-10-CM | POA: Diagnosis not present

## 2018-07-06 DIAGNOSIS — M25562 Pain in left knee: Secondary | ICD-10-CM | POA: Diagnosis not present

## 2018-07-06 DIAGNOSIS — Z79899 Other long term (current) drug therapy: Secondary | ICD-10-CM | POA: Diagnosis not present

## 2018-07-06 DIAGNOSIS — G894 Chronic pain syndrome: Secondary | ICD-10-CM | POA: Diagnosis not present

## 2018-07-06 DIAGNOSIS — Z79891 Long term (current) use of opiate analgesic: Secondary | ICD-10-CM | POA: Diagnosis not present

## 2018-07-06 DIAGNOSIS — M4716 Other spondylosis with myelopathy, lumbar region: Secondary | ICD-10-CM | POA: Diagnosis not present

## 2018-07-06 DIAGNOSIS — M5116 Intervertebral disc disorders with radiculopathy, lumbar region: Secondary | ICD-10-CM | POA: Diagnosis not present

## 2018-07-28 ENCOUNTER — Ambulatory Visit (INDEPENDENT_AMBULATORY_CARE_PROVIDER_SITE_OTHER): Payer: PPO

## 2018-07-28 VITALS — BP 110/50 | HR 91 | Temp 98.7°F | Resp 12 | Ht 61.0 in | Wt 246.8 lb

## 2018-07-28 DIAGNOSIS — Z1239 Encounter for other screening for malignant neoplasm of breast: Secondary | ICD-10-CM

## 2018-07-28 DIAGNOSIS — Z1211 Encounter for screening for malignant neoplasm of colon: Secondary | ICD-10-CM | POA: Diagnosis not present

## 2018-07-28 DIAGNOSIS — Z0000 Encounter for general adult medical examination without abnormal findings: Secondary | ICD-10-CM | POA: Diagnosis not present

## 2018-07-28 DIAGNOSIS — Z1231 Encounter for screening mammogram for malignant neoplasm of breast: Secondary | ICD-10-CM

## 2018-07-28 NOTE — Patient Instructions (Signed)
Erica Ruiz , Thank you for taking time to come for your Medicare Wellness Visit. I appreciate your ongoing commitment to your health goals. Please review the following plan we discussed and let me know if I can assist you in the future.   Screening recommendations/referrals: Colorectal Screening: You will receive a call from our office regarding your appointment Mammogram: You will receive a call from our office regarding your appointment Bone Density: Not yet required  Vision and Dental Exams: Recommended annual ophthalmology exams for early detection of glaucoma and other disorders of the eye Recommended annual dental exams for proper oral hygiene  Diabetic Exams: Recommended annual diabetic eye exams for early detection of retinopathy Recommended annual diabetic foot exams for early detection of peripheral neuropathy.  Diabetic Eye Exam: Up to date Diabetic Foot Exam: Please keep your appointment as scheduled with Dr. Ronnald Ramp  Vaccinations: Influenza vaccine: Up to date Pneumococcal vaccine: Not yet required Tdap vaccine: Declined. Please call your insurance company to determine your out of pocket expense. You may also receive this vaccine at your local pharmacy or Health Dept. Shingles vaccine: Please call your insurance company to determine your out of pocket expense for the Shingrix vaccine. You may also receive this vaccine at your local pharmacy or Health Dept.  Advanced directives: Advance directive discussed with you today. I have provided a copy for you to complete at home and have notarized. Once this is complete please bring a copy in to our office so we can scan it into your chart.  Goals: Recommend to drink at least 6-8 8oz glasses of water per day.  Next appointment: Please schedule your Annual Wellness Visit with your Nurse Health Advisor in one year.  Preventive Care 40-64 Years, Female Preventive care refers to lifestyle choices and visits with your health care  provider that can promote health and wellness. What does preventive care include?  A yearly physical exam. This is also called an annual well check.  Dental exams once or twice a year.  Routine eye exams. Ask your health care provider how often you should have your eyes checked.  Personal lifestyle choices, including:  Daily care of your teeth and gums.  Regular physical activity.  Eating a healthy diet.  Avoiding tobacco and drug use.  Limiting alcohol use.  Practicing safe sex.  Taking low-dose aspirin daily starting at age 28.  Taking vitamin and mineral supplements as recommended by your health care provider. What happens during an annual well check? The services and screenings done by your health care provider during your annual well check will depend on your age, overall health, lifestyle risk factors, and family history of disease. Counseling  Your health care provider may ask you questions about your:  Alcohol use.  Tobacco use.  Drug use.  Emotional well-being.  Home and relationship well-being.  Sexual activity.  Eating habits.  Work and work Statistician.  Method of birth control.  Menstrual cycle.  Pregnancy history. Screening  You may have the following tests or measurements:  Height, weight, and BMI.  Blood pressure.  Lipid and cholesterol levels. These may be checked every 5 years, or more frequently if you are over 96 years old.  Skin check.  Lung cancer screening. You may have this screening every year starting at age 71 if you have a 30-pack-year history of smoking and currently smoke or have quit within the past 15 years.  Fecal occult blood test (FOBT) of the stool. You may have this test every  year starting at age 70.  Flexible sigmoidoscopy or colonoscopy. You may have a sigmoidoscopy every 5 years or a colonoscopy every 10 years starting at age 67.  Hepatitis C blood test.  Hepatitis B blood test.  Sexually transmitted  disease (STD) testing.  Diabetes screening. This is done by checking your blood sugar (glucose) after you have not eaten for a while (fasting). You may have this done every 1-3 years.  Mammogram. This may be done every 1-2 years. Talk to your health care provider about when you should start having regular mammograms. This may depend on whether you have a family history of breast cancer.  BRCA-related cancer screening. This may be done if you have a family history of breast, ovarian, tubal, or peritoneal cancers.  Pelvic exam and Pap test. This may be done every 3 years starting at age 62. Starting at age 24, this may be done every 5 years if you have a Pap test in combination with an HPV test.  Bone density scan. This is done to screen for osteoporosis. You may have this scan if you are at high risk for osteoporosis. Discuss your test results, treatment options, and if necessary, the need for more tests with your health care provider. Vaccines  Your health care provider may recommend certain vaccines, such as:  Influenza vaccine. This is recommended every year.  Tetanus, diphtheria, and acellular pertussis (Tdap, Td) vaccine. You may need a Td booster every 10 years.  Zoster vaccine. You may need this after age 68.  Pneumococcal 13-valent conjugate (PCV13) vaccine. You may need this if you have certain conditions and were not previously vaccinated.  Pneumococcal polysaccharide (PPSV23) vaccine. You may need one or two doses if you smoke cigarettes or if you have certain conditions. Talk to your health care provider about which screenings and vaccines you need and how often you need them. This information is not intended to replace advice given to you by your health care provider. Make sure you discuss any questions you have with your health care provider. Document Released: 12/28/2015 Document Revised: 08/20/2016 Document Reviewed: 10/02/2015 Elsevier Interactive Patient Education  2017  [place] Prevention in the Home Falls can cause injuries. They can happen to people of all ages. There are many things you can do to make your home safe and to help prevent falls. What can I do on the outside of my home?  Regularly fix the edges of walkways and driveways and fix any cracks.  Remove anything that might make you trip as you walk through a door, such as a raised step or threshold.  Trim any bushes or trees on the path to your home.  Use bright outdoor lighting.  Clear any walking paths of anything that might make someone trip, such as rocks or tools.  Regularly check to see if handrails are loose or broken. Make sure that both sides of any steps have handrails.  Any raised decks and porches should have guardrails on the edges.  Have any leaves, snow, or ice cleared regularly.  Use sand or salt on walking paths during winter.  Clean up any spills in your garage right away. This includes oil or grease spills. What can I do in the bathroom?  Use night lights.  Install grab bars by the toilet and in the tub and shower. Do not use towel bars as grab bars.  Use non-skid mats or decals in the tub or shower.  If you need  to sit down in the shower, use a plastic, non-slip stool.  Keep the floor dry. Clean up any water that spills on the floor as soon as it happens.  Remove soap buildup in the tub or shower regularly.  Attach bath mats securely with double-sided non-slip rug tape.  Do not have throw rugs and other things on the floor that can make you trip. What can I do in the bedroom?  Use night lights.  Make sure that you have a light by your bed that is easy to reach.  Do not use any sheets or blankets that are too big for your bed. They should not hang down onto the floor.  Have a firm chair that has side arms. You can use this for support while you get dressed.  Do not have throw rugs and other things on the floor that can make you  trip. What can I do in the kitchen?  Clean up any spills right away.  Avoid walking on wet floors.  Keep items that you use a lot in easy-to-reach places.  If you need to reach something above you, use a strong step stool that has a grab bar.  Keep electrical cords out of the way.  Do not use floor polish or wax that makes floors slippery. If you must use wax, use non-skid floor wax.  Do not have throw rugs and other things on the floor that can make you trip. What can I do with my stairs?  Do not leave any items on the stairs.  Make sure that there are handrails on both sides of the stairs and use them. Fix handrails that are broken or loose. Make sure that handrails are as long as the stairways.  Check any carpeting to make sure that it is firmly attached to the stairs. Fix any carpet that is loose or worn.  Avoid having throw rugs at the top or bottom of the stairs. If you do have throw rugs, attach them to the floor with carpet tape.  Make sure that you have a light switch at the top of the stairs and the bottom of the stairs. If you do not have them, ask someone to add them for you. What else can I do to help prevent falls?  Wear shoes that:  Do not have high heels.  Have rubber bottoms.  Are comfortable and fit you well.  Are closed at the toe. Do not wear sandals.  If you use a stepladder:  Make sure that it is fully opened. Do not climb a closed stepladder.  Make sure that both sides of the stepladder are locked into place.  Ask someone to hold it for you, if possible.  Clearly mark and make sure that you can see:  Any grab bars or handrails.  First and last steps.  Where the edge of each step is.  Use tools that help you move around (mobility aids) if they are needed. These include:  Canes.  Walkers.  Scooters.  Crutches.  Turn on the lights when you go into a dark area. Replace any light bulbs as soon as they burn out.  Set up your  furniture so you have a clear path. Avoid moving your furniture around.  If any of your floors are uneven, fix them.  If there are any pets around you, be aware of where they are.  Review your medicines with your doctor. Some medicines can make you feel dizzy. This can increase your  chance of falling. Ask your doctor what other things that you can do to help prevent falls. This information is not intended to replace advice given to you by your health care provider. Make sure you discuss any questions you have with your health care provider. Document Released: 09/27/2009 Document Revised: 05/08/2016 Document Reviewed: 01/05/2015 Elsevier Interactive Patient Education  2017 [employer].

## 2018-07-28 NOTE — Progress Notes (Signed)
Subjective:   Erica Ruiz is a 57 y.o. female who presents for an Initial Medicare Annual Wellness Visit.  Review of Systems    N/A  Cardiac Risk Factors include: diabetes mellitus;hypertension;obesity (BMI >30kg/m2);sedentary lifestyle;smoking/ tobacco exposure     Objective:    Today's Vitals   07/28/18 1353  BP: (!) 110/50  Pulse: 91  Resp: 12  Temp: 98.7 F (37.1 C)  TempSrc: Oral  SpO2: 92%  Weight: 246 lb 12.8 oz (111.9 kg)  Height: 5\' 1"  (1.549 m)   Body mass index is 46.63 kg/m.  Advanced Directives 07/28/2018 06/28/2018  Does Patient Have a Medical Advance Directive? No No  Would patient like information on creating a medical advance directive? Yes (MAU/Ambulatory/Procedural Areas - Information given) No - Patient declined    Current Medications (verified) Outpatient Encounter Medications as of 07/28/2018  Medication Sig  . DULoxetine (CYMBALTA) 30 MG capsule TK ONE C PO ONCE A DAY FOR 90 DAYS- pain management  . empagliflozin (JARDIANCE) 25 MG TABS tablet Take 25 mg by mouth daily.  Marland Kitchen gabapentin (NEURONTIN) 600 MG tablet TK 1 T PO BID- pain management  . hydrochlorothiazide (HYDRODIURIL) 25 MG tablet Take 1 tablet (25 mg total) by mouth daily.  Marland Kitchen lisinopril (PRINIVIL,ZESTRIL) 10 MG tablet Take 1 tablet (10 mg total) by mouth daily.  . metFORMIN (GLUCOPHAGE-XR) 500 MG 24 hr tablet Take 1,000 mg by mouth 2 (two) times daily.  Marland Kitchen morphine (MS CONTIN) 15 MG 12 hr tablet TK 1 T PO  Q 8 H FOR 30 DAYS- pain management  . naproxen sodium (ALEVE) 220 MG tablet Take 220 mg by mouth every 12 (twelve) hours as needed.  Marland Kitchen oxyCODONE-acetaminophen (PERCOCET) 7.5-325 MG tablet TK 1 T PO Q 8 H PRN- pain management  . PREVIDENT 5000 ENAMEL PROTECT 1.1-5 % PSTE   . tiZANidine (ZANAFLEX) 4 MG tablet TK 1 T PO Q 8 H PRF SPASMS- pain management   No facility-administered encounter medications on file as of 07/28/2018.     Allergies (verified) Patient has no known  allergies.   History: Past Medical History:  Diagnosis Date  . Back pain   . Diabetes mellitus without complication ([place])   . Hypertension    Past Surgical History:  Procedure Laterality Date  . CHOLECYSTECTOMY    . COLON SURGERY    . COLOSTOMY REVERSAL    . KNEE SURGERY    . TONSILLECTOMY    . TUBAL LIGATION     Family History  Problem Relation Age of Onset  . Pulmonary embolism Mother   . Cancer Father        prostate  . Heart disease Father   . Hypertension Father   . Cancer Sister        non-hodgkin's lymphoma  . Hepatitis C Sister   . Cancer Brother        colon  . Diabetes Brother   . Heart disease Brother   . Hypertension Brother   . Diabetes Daughter   . Diabetes Brother   . Hypertension Sister   . Cancer Brother        prostate   Social History   Socioeconomic History  . Marital status: Married    Spouse name: Not on file  . Number of children: 4  . Years of education: Not on file  . Highest education level: 12th grade  Occupational History  . Occupation: Disabled  Social Needs  . Financial resource strain: Not hard at all  .  Food insecurity:    Worry: Never true    Inability: Never true  . Transportation needs:    Medical: No    Non-medical: No  Tobacco Use  . Smoking status: Current Every Day Smoker    Packs/day: 0.50    Years: 25.00    Pack years: 12.50    Types: Cigarettes  . Smokeless tobacco: Never Used  Substance and Sexual Activity  . Alcohol use: Never    Frequency: Never  . Drug use: Never  . Sexual activity: Not Currently  Lifestyle  . Physical activity:    Days per week: 0 days    Minutes per session: 0 min  . Stress: Not at all  Relationships  . Social connections:    Talks on phone: Patient refused    Gets together: Patient refused    Attends religious service: Patient refused    Active member of club or organization: Patient refused    Attends meetings of clubs or organizations: Patient refused    Relationship  status: Married  Other Topics Concern  . Not on file  Social History Narrative  . Not on file    Tobacco Counseling Ready to quit: No Counseling given: Yes  Clinical Intake:  Pre-visit preparation completed: Yes  Pain : No/denies pain   BMI - recorded: 48.24 Nutritional Status: BMI > 30  Obese Nutritional Risks: None  Nutrition Risk Assessment: Has the patient had any N/V/D within the last 2 months?  No Does the patient have any non-healing wounds?  No Has the patient had any unintentional weight loss or weight gain?  No  Is the patient diabetic?  Yes If diabetic, was a CBG obtained today?  No Did the patient bring in their glucometer from home?  No Comments: Pt monitors CBG's twice daily. Denies any financial strains with the device or supplies.  Diabetic Exams: Diabetic Eye Exam: Completed 06/09/18.  Diabetic Foot Exam: Overdue. Pt has been advised about the importance in completing this exam. Pt is scheduled for diabetic foot exam on 08/04/18 with Dr. Ronnald Ramp.  How often do you need to have someone help you when you read instructions, pamphlets, or other written materials from your doctor or pharmacy?: 1 - Never  Interpreter Needed?: No  Information entered by :: AEversole, LPN   Activities of Daily Living In your present state of health, do you have any difficulty performing the following activities: 07/28/2018  Hearing? N  Comment denies hearing aids  Vision? N  Comment wears eyeglasses  Difficulty concentrating or making decisions? N  Walking or climbing stairs? Y  Comment knee and back pain  Dressing or bathing? N  Doing errands, shopping? N  Preparing Food and eating ? N  Comment denies dentures  Using the Toilet? N  In the past six months, have you accidently leaked urine? Y  Comment urgency; trickle  Do you have problems with loss of bowel control? N  Managing your Medications? N  Managing your Finances? N  Housekeeping or managing your Housekeeping?  N  Some recent data might be hidden     Immunizations and Health Maintenance Immunization History  Administered Date(s) Administered  . Influenza Inj Mdck Quad With Preservative 12/17/2017   Health Maintenance Due  Topic Date Due  . Hepatitis C Screening  26-Nov-1961  . FOOT EXAM  09/04/1972  . MAMMOGRAM  09/04/1980  . TETANUS/TDAP  09/04/1981  . PAP SMEAR  09/05/1983  . COLONOSCOPY  09/04/2012  . INFLUENZA VACCINE  07/15/2018  Patient Care Team: Juline Patch, MD as PCP - General (Family Medicine) Warnell Forester, NP as Nurse Practitioner (Surgery)  Indicate any recent Medical Services you may have received from other than [facility] providers in the past year (date may be approximate).     Assessment:   This is a routine wellness examination for [place].  Hearing/Vision screen Vision Screening Comments: Sees Dr. Ellin Mayhew for annual eye exams  Dietary issues and exercise activities discussed: Current Exercise Habits: The patient does not participate in regular exercise at present, Exercise limited by: Other - see comments(knee pain and foot drop)  Goals    . DIET - INCREASE WATER INTAKE     Recommend to drink at least 6-8 8oz glasses of water per day.    . Quit Smoking     Recommend to enroll in smoking cessation classes or discuss possibility of smoking cessation alternatives with provider      Depression Screen PHQ 2/9 Scores 07/28/2018 06/28/2018 04/16/2018  PHQ - 2 Score 0 0 0  PHQ- 9 Score 0 - 4    Fall Risk Fall Risk  07/28/2018 06/28/2018  Falls in the past year? No No  Risk for fall due to : Impaired vision;Medication side effect;Impaired balance/gait;Other (Comment) Impaired balance/gait  Risk for fall due to: Comment wears eyeglasses; ambulates with cane; R foot drop -    FALL RISK PREVENTION PERTAINING TO HOME: Is your home free of loose throw rugs in walkways, pet beds, electrical cords, etc? Yes Is there adequate lighting in your home to reduce risk  of falls?  Yes Are there stairs in or around your home WITH handrails? Yes  ASSISTIVE DEVICES UTILIZED TO PREVENT FALLS: Use of a cane, walker or w/c? Yes, cane Grab bars in the bathroom? Yes  Shower chair or a place to sit while bathing? Yes An elevated toilet seat or a handicapped toilet? No  Timed Get Up and Go Performed: Yes. Pt ambulated 10 feet within 30 sec. Gait slow, steady and with the use of an assistive device. No intervention required at this time. Fall risk prevention has been discussed.  Community Resource Referral:  Pt declined my offer to send [employer] Referral to Care Guide for an elevated toilet seat.  Cognitive Function:     6CIT Screen 07/28/2018  What Year? 0 points  What month? 0 points  What time? 0 points  Count back from 20 0 points  Months in reverse 4 points  Repeat phrase 6 points  Total Score 10    Screening Tests Health Maintenance  Topic Date Due  . Hepatitis C Screening  07-23-1961  . FOOT EXAM  09/04/1972  . MAMMOGRAM  09/04/1980  . TETANUS/TDAP  09/04/1981  . PAP SMEAR  09/05/1983  . COLONOSCOPY  09/04/2012  . INFLUENZA VACCINE  07/15/2018  . PNEUMOCOCCAL POLYSACCHARIDE VACCINE AGE 59-64 HIGH RISK  09/04/2028 (Originally 09/04/1964)  . HEMOGLOBIN A1C  11/27/2018  . OPHTHALMOLOGY EXAM  06/10/2019  . HIV Screening  Discontinued    Qualifies for Shingles Vaccine? Yes. Due for Shingrix. Education has been provided regarding the importance of this vaccine. Pt has been advised to call insurance company to determine out of pocket expense. Advised may also receive vaccine at local pharmacy or Health Dept. Verbalized acceptance and understanding.  Due for Tdap vaccine. Education has been provided regarding the importance of this vaccine. Advised may receive this vaccine at local pharmacy or Health Dept. Aware to provide a copy of the vaccination record if  obtained from local pharmacy or Health Dept. Verbalized acceptance and  understanding.  Cancer Screenings: Lung: Low Dose CT Chest recommended if Age 68-80 years, 30 pack-year currently smoking OR have quit w/in 15years. Patient does not qualify. Breast: Up to date on Mammogram? No. Ordered today   Bone Density/Dexa: Not yet required Colorectal: Referral placed with GI today.   Additional Screenings: Hepatitis C Screening: Declined   Plan:  I have personally reviewed and addressed the Medicare Annual Wellness questionnaire and have noted the following in the patient's chart:  A. Medical and social history B. Use of alcohol, tobacco or illicit drugs  C. Current medications and supplements D. Functional ability and status E.  Nutritional status F.  Physical activity G. Advance directives H. List of other physicians I.  Hospitalizations, surgeries, and ER visits in previous 12 months J.  [place] such as hearing and vision if needed, cognitive and depression L. Referrals and appointments  In addition, I have reviewed and discussed with patient certain preventive protocols, quality metrics, and best practice recommendations. A written personalized care plan for preventive services as well as general preventive health recommendations were provided to patient.  Signed,  Aleatha Borer, LPN Nurse Health Advisor  MD Recommendations: Due for Shingrix. Education has been provided regarding the importance of this vaccine. Pt has been advised to call insurance company to determine out of pocket expense. Advised may also receive vaccine at local pharmacy or Health Dept. Verbalized acceptance and understanding.  Due for Tdap vaccine. Education has been provided regarding the importance of this vaccine. Advised may receive this vaccine at local pharmacy or Health Dept. Aware to provide a copy of the vaccination record if obtained from local pharmacy or Health Dept. Verbalized acceptance and understanding.  Mammogram: Ordered today   Colorectal:  Referral placed with GI today.  Diabetic Foot Exam: Overdue. Pt has been advised about the importance in completing this exam. Pt is scheduled for diabetic foot exam on 08/04/18 with Dr. Ronnald Ramp.  Hepatitis C Screening: Declined

## 2018-08-04 ENCOUNTER — Ambulatory Visit (INDEPENDENT_AMBULATORY_CARE_PROVIDER_SITE_OTHER): Payer: PPO | Admitting: Family Medicine

## 2018-08-04 ENCOUNTER — Encounter: Payer: Self-pay | Admitting: Family Medicine

## 2018-08-04 ENCOUNTER — Other Ambulatory Visit: Payer: Self-pay

## 2018-08-04 VITALS — BP 140/80 | HR 76 | Ht 61.0 in | Wt 245.0 lb

## 2018-08-04 DIAGNOSIS — Z1231 Encounter for screening mammogram for malignant neoplasm of breast: Secondary | ICD-10-CM

## 2018-08-04 DIAGNOSIS — Z1211 Encounter for screening for malignant neoplasm of colon: Secondary | ICD-10-CM

## 2018-08-04 DIAGNOSIS — F17200 Nicotine dependence, unspecified, uncomplicated: Secondary | ICD-10-CM | POA: Diagnosis not present

## 2018-08-04 DIAGNOSIS — Z1159 Encounter for screening for other viral diseases: Secondary | ICD-10-CM | POA: Diagnosis not present

## 2018-08-04 DIAGNOSIS — Z01419 Encounter for gynecological examination (general) (routine) without abnormal findings: Secondary | ICD-10-CM

## 2018-08-04 DIAGNOSIS — Z0000 Encounter for general adult medical examination without abnormal findings: Secondary | ICD-10-CM | POA: Diagnosis not present

## 2018-08-04 DIAGNOSIS — Z23 Encounter for immunization: Secondary | ICD-10-CM

## 2018-08-04 DIAGNOSIS — Z1239 Encounter for other screening for malignant neoplasm of breast: Secondary | ICD-10-CM

## 2018-08-04 LAB — HEMOCCULT GUIAC POC 1CARD (OFFICE): FECAL OCCULT BLD: NEGATIVE

## 2018-08-04 NOTE — Progress Notes (Signed)
Name: Erica Ruiz   MRN: [id number]    DOB: Feb 19, 1961   Date:08/04/2018       Progress Note  Subjective  Chief Complaint  Chief Complaint  Patient presents with  . Annual Exam    pap and mammo  . tdap  . Colon Cancer Screening    needs colonoscopy scheduled    Patient is a 57 year old female who presents for a comprehensive physical exam. The patient reports the following problems: none. Health maintenance has been reviewed due tdap.   No problem-specific Assessment & Plan notes found for this encounter.   Past Medical History:  Diagnosis Date  . Back pain   . Diabetes mellitus without complication ([place])   . Hypertension     Past Surgical History:  Procedure Laterality Date  . CHOLECYSTECTOMY    . COLON SURGERY    . COLOSTOMY REVERSAL    . KNEE SURGERY    . TONSILLECTOMY    . TUBAL LIGATION      Family History  Problem Relation Age of Onset  . Pulmonary embolism Mother   . Cancer Father        prostate  . Heart disease Father   . Hypertension Father   . Cancer Sister        non-hodgkin's lymphoma  . Hepatitis C Sister   . Cancer Brother        colon  . Diabetes Brother   . Heart disease Brother   . Hypertension Brother   . Diabetes Daughter   . Diabetes Brother   . Hypertension Sister   . Cancer Brother        prostate    Social History   Socioeconomic History  . Marital status: Married    Spouse name: Not on file  . Number of children: 4  . Years of education: Not on file  . Highest education level: 12th grade  Occupational History  . Occupation: Disabled  Social Needs  . Financial resource strain: Not hard at all  . Food insecurity:    Worry: Never true    Inability: Never true  . Transportation needs:    Medical: No    Non-medical: No  Tobacco Use  . Smoking status: Current Every Day Smoker    Packs/day: 0.50    Years: 25.00    Pack years: 12.50    Types: Cigarettes  . Smokeless tobacco: Never Used  Substance and Sexual  Activity  . Alcohol use: Never    Frequency: Never  . Drug use: Never  . Sexual activity: Not Currently  Lifestyle  . Physical activity:    Days per week: 0 days    Minutes per session: 0 min  . Stress: Not at all  Relationships  . Social connections:    Talks on phone: Patient refused    Gets together: Patient refused    Attends religious service: Patient refused    Active member of club or organization: Patient refused    Attends meetings of clubs or organizations: Patient refused    Relationship status: Married  . Intimate partner violence:    Fear of current or ex partner: No    Emotionally abused: No    Physically abused: No    Forced sexual activity: No  Other Topics Concern  . Not on file  Social History Narrative  . Not on file    No Known Allergies  Outpatient Medications Prior to Visit  Medication Sig Dispense Refill  . DULoxetine (  CYMBALTA) 30 MG capsule TK ONE C PO ONCE A DAY FOR 90 DAYS- pain management  3  . empagliflozin (JARDIANCE) 25 MG TABS tablet Take 25 mg by mouth daily. Hilary    . gabapentin (NEURONTIN) 600 MG tablet TK 1 T PO BID- pain management  3  . hydrochlorothiazide (HYDRODIURIL) 25 MG tablet Take 1 tablet (25 mg total) by mouth daily. 90 tablet 1  . lisinopril (PRINIVIL,ZESTRIL) 10 MG tablet Take 1 tablet (10 mg total) by mouth daily. 90 tablet 3  . metFORMIN (GLUCOPHAGE-XR) 500 MG 24 hr tablet Take 1,000 mg by mouth 2 (two) times daily. Hilary    . morphine (MS CONTIN) 15 MG 12 hr tablet TK 1 T PO  Q 8 H FOR 30 DAYS- pain management  0  . naproxen sodium (ALEVE) 220 MG tablet Take 220 mg by mouth every 12 (twelve) hours as needed.    Marland Kitchen oxyCODONE-acetaminophen (PERCOCET) 7.5-325 MG tablet TK 1 T PO Q 8 H PRN- pain management  0  . PREVIDENT 5000 ENAMEL PROTECT 1.1-5 % PSTE   4  . tiZANidine (ZANAFLEX) 4 MG tablet TK 1 T PO Q 8 H PRF SPASMS- pain management  3   No facility-administered medications prior to visit.     Review of Systems   Constitutional: Negative for chills, fever, malaise/fatigue and weight loss.  HENT: Negative for ear discharge, ear pain and sore throat.   Eyes: Negative for blurred vision.  Respiratory: Negative for cough, sputum production, shortness of breath and wheezing.   Cardiovascular: Negative for chest pain, palpitations and leg swelling.  Gastrointestinal: Negative for abdominal pain, blood in stool, constipation, diarrhea, heartburn, melena and nausea.  Genitourinary: Negative for dysuria, frequency, hematuria and urgency.  Musculoskeletal: Negative for back pain, joint pain, myalgias and neck pain.  Skin: Negative for rash.  Neurological: Negative for dizziness, tingling, sensory change, focal weakness and headaches.  Endo/Heme/Allergies: Negative for environmental allergies and polydipsia. Does not bruise/bleed easily.  Psychiatric/Behavioral: Negative for depression and suicidal ideas. The patient is not nervous/anxious and does not have insomnia.      Objective  Vitals:   08/04/18 0936  BP: 140/80  Pulse: 76  Weight: 245 lb (111.1 kg)  Height: 5\' 1"  (1.549 m)    Physical Exam  Constitutional: Vital signs are normal. She appears well-developed and well-nourished. No distress.  HENT:  Head: Normocephalic and atraumatic.  Right Ear: Hearing, tympanic membrane, external ear and ear canal normal.  Left Ear: Hearing, tympanic membrane, external ear and ear canal normal.  Nose: Nose normal. Right sinus exhibits no maxillary sinus tenderness and no frontal sinus tenderness. Left sinus exhibits no maxillary sinus tenderness and no frontal sinus tenderness.  Mouth/Throat: Uvula is midline and oropharynx is clear and moist. No oropharyngeal exudate, posterior oropharyngeal edema or posterior oropharyngeal erythema.  Eyes: Pupils are equal, round, and reactive to light. Conjunctivae, EOM and lids are normal. Right eye exhibits no discharge. Left eye exhibits no discharge.  Fundoscopic  exam:      The right eye shows no AV nicking.       The left eye shows no AV nicking.  Neck: Trachea normal, normal range of motion, full passive range of motion without pain and phonation normal. Neck supple. Normal carotid pulses, no hepatojugular reflux and no JVD present. Carotid bruit is not present. No thyroid mass and no thyromegaly present.  Cardiovascular: Normal rate, regular rhythm, S1 normal, S2 normal, normal heart sounds and intact distal pulses. PMI  is not displaced. Exam reveals no gallop, no S3, no S4 and no friction rub.  No murmur heard.  No systolic murmur is present.  No diastolic murmur is present. Pulses:      Carotid pulses are 2+ on the right side, and 2+ on the left side.      Radial pulses are 2+ on the right side, and 2+ on the left side.       Femoral pulses are 2+ on the right side, and 2+ on the left side.      Popliteal pulses are 2+ on the right side, and 2+ on the left side.       Dorsalis pedis pulses are 2+ on the right side, and 2+ on the left side.       Posterior tibial pulses are 2+ on the right side, and 2+ on the left side.  Pulmonary/Chest: Effort normal and breath sounds normal. No stridor. She has no decreased breath sounds. She has no wheezes. Right breast exhibits no inverted nipple, no mass, no nipple discharge, no skin change and no tenderness. Left breast exhibits no inverted nipple, no mass, no nipple discharge, no skin change and no tenderness. No breast swelling, tenderness, discharge or bleeding. Breasts are symmetrical.  Abdominal: Soft. Normal appearance and bowel sounds are normal. She exhibits no mass. There is no hepatosplenomegaly. There is no tenderness. There is no guarding. No hernia.  Genitourinary: Rectum normal, vagina normal and uterus normal. Rectal exam shows no mass and guaiac negative stool. Pelvic exam was performed with patient supine. There is no rash, tenderness or lesion on the right labia. There is no rash, tenderness or  lesion on the left labia. Cervix exhibits no motion tenderness and no discharge. Right adnexum displays no mass, no tenderness and no fullness. Left adnexum displays no mass, no tenderness and no fullness.  Genitourinary Comments: ? Left ovary removed  Musculoskeletal: Normal range of motion. She exhibits no edema.       Lumbar back: Normal.  Feet:  Right Foot:  Protective Sensation: 10 sites tested. 10 sites sensed.  Skin Integrity: Positive for dry skin. Negative for ulcer, blister, skin breakdown, erythema, warmth or callus.  Left Foot:  Protective Sensation: 10 sites tested. 10 sites sensed.  Skin Integrity: Positive for dry skin. Negative for ulcer, blister, skin breakdown, erythema, warmth or callus.  Lymphadenopathy:       Head (right side): No submental and no submandibular adenopathy present.       Head (left side): No submental and no submandibular adenopathy present.    She has no cervical adenopathy.    She has no axillary adenopathy.  Neurological: She is alert. She has normal strength and normal reflexes. She displays no atrophy, no tremor and normal reflexes. No cranial nerve deficit or sensory deficit. She exhibits normal muscle tone. She displays no seizure activity.  Skin: Skin is warm, dry and intact. Capillary refill takes less than 2 seconds. No rash noted. She is not diaphoretic. No pallor.  Rubrum lower ext  Nursing note and vitals reviewed.     Assessment & Plan  Problem List Items Addressed This Visit    None    Visit Diagnoses    Annual physical exam    -  Primary   Relevant Orders   Renal Function Panel   Tobacco dependence       Discuss smoking cessation   Encounter for cervical Pap smear with pelvic exam  Encounter for PAP and pelvic. Pap obtained.   Relevant Orders   Human papillomavirus (HPV) high risk ([place])   Breast cancer screening       Referral for 3-D mammography   Relevant Orders   MM 3D SCREEN BREAST BILATERAL   Colon  cancer screening       Screen. [place] negative. Referral for colonocopy Dr Allen Norris.   Relevant Orders   Ambulatory referral to Gastroenterology   POCT occult blood stool (Completed)   Need for hepatitis C screening test       Patient desires screen for hepatitis c.   Relevant Orders   Hepatitis C antibody   Need for Tdap vaccination       T Dap administered   Relevant Orders   Tdap vaccine greater than or equal to 7yo IM (Completed)      No orders of the defined types were placed in this encounter. Erica Ruiz is a 57 y.o. female who presents today for her Complete Annual Exam. She feels well. She reports exercising some. She reports she is sleeping well.  Immunizations are reviewed and recommendations provided.   Age appropriate screening tests are discussed. Counseling given for risk factor reduction interventions. Patient has been advised of the health risks of smoking and counseled concerning cessation of tobacco products. I spent over 3 minutes for discussion and to answer questions. Health risks of being over weight were discussed and patient was counseled on weight loss options and exercise. Dr. [facility]  08/04/18

## 2018-08-05 ENCOUNTER — Other Ambulatory Visit (HOSPITAL_COMMUNITY)
Admission: RE | Admit: 2018-08-05 | Discharge: 2018-08-05 | Disposition: A | Discharge status: Home / Self Care | Payer: PPO | Source: Ambulatory Visit | Attending: Family Medicine | Admitting: Family Medicine

## 2018-08-05 ENCOUNTER — Other Ambulatory Visit: Payer: Self-pay

## 2018-08-05 DIAGNOSIS — Z01419 Encounter for gynecological examination (general) (routine) without abnormal findings: Secondary | ICD-10-CM

## 2018-08-05 LAB — RENAL FUNCTION PANEL
Albumin: 4.3 g/dL (ref 3.5–5.5)
BUN/Creatinine Ratio: 11 (ref 9–23)
BUN: 8 mg/dL (ref 6–24)
CALCIUM: 9.8 mg/dL (ref 8.7–10.2)
CO2: 24 mmol/L (ref 20–29)
Chloride: 103 mmol/L (ref 96–106)
Creatinine, Ser: 0.73 mg/dL (ref 0.57–1.00)
GFR calc Af Amer: 107 mL/min/{1.73_m2} (ref >59)
GFR calc non Af Amer: 93 mL/min/{1.73_m2} (ref >59)
GLUCOSE: 119 mg/dL — AB (ref 65–99)
PHOSPHORUS: 4.7 mg/dL — AB (ref 2.5–4.5)
POTASSIUM: 4.7 mmol/L (ref 3.5–5.2)
SODIUM: 140 mmol/L (ref 134–144)

## 2018-08-05 LAB — HM PAP SMEAR: HM Pap smear: NEGATIVE

## 2018-08-05 LAB — RESULTS CONSOLE HPV: CHL HPV: NEGATIVE

## 2018-08-05 LAB — HEPATITIS C ANTIBODY

## 2018-08-05 NOTE — Progress Notes (Unsigned)
lab4

## 2018-08-06 LAB — CYTOLOGY - PAP
Diagnosis: NEGATIVE
HPV: NOT DETECTED

## 2018-08-12 ENCOUNTER — Other Ambulatory Visit: Payer: Self-pay

## 2018-08-19 NOTE — Discharge Instructions (Signed)
General Anesthesia, Adult, Care After °These instructions provide you with information about caring for yourself after your procedure. Your health care provider may also give you more specific instructions. Your treatment has been planned according to current medical practices, but problems sometimes occur. Call your health care provider if you have any problems or questions after your procedure. °What can I expect after the procedure? °After the procedure, it is common to have: °· Vomiting. °· A sore throat. °· Mental slowness. ° °It is common to feel: °· Nauseous. °· Cold or shivery. °· Sleepy. °· Tired. °· Sore or achy, even in parts of your body where you did not have surgery. ° °Follow these instructions at home: °For at least 24 hours after the procedure: °· Do not: °? Participate in activities where you could fall or become injured. °? Drive. °? Use heavy machinery. °? Drink alcohol. °? Take sleeping pills or medicines that cause drowsiness. °? Make important decisions or sign legal documents. °? Take care of children on your own. °· Rest. °Eating and drinking °· If you vomit, drink water, juice, or soup when you can drink without vomiting. °· Drink enough fluid to keep your urine clear or pale yellow. °· Make sure you have little or no nausea before eating solid foods. °· Follow the diet recommended by your health care provider. °General instructions °· Have a responsible adult stay with you until you are awake and alert. °· Return to your normal activities as told by your health care provider. Ask your health care provider what activities are safe for you. °· Take over-the-counter and prescription medicines only as told by your health care provider. °· If you smoke, do not smoke without supervision. °· Keep all follow-up visits as told by your health care provider. This is important. °Contact a health care provider if: °· You continue to have nausea or vomiting at home, and medicines are not helpful. °· You  cannot drink fluids or start eating again. °· You cannot urinate after 8-12 hours. °· You develop a skin rash. °· You have fever. °· You have increasing redness at the site of your procedure. °Get help right away if: °· You have difficulty breathing. °· You have chest pain. °· You have unexpected bleeding. °· You feel that you are having a life-threatening or urgent problem. °This information is not intended to replace advice given to you by your health care provider. Make sure you discuss any questions you have with your health care provider. °Document Released: 03/09/2001 Document Revised: 05/05/2016 Document Reviewed: 11/15/2015 °Elsevier Interactive Patient Education © 2018 [employer] ° °

## 2018-08-23 ENCOUNTER — Ambulatory Visit
Admission: RE | Admit: 2018-08-23 | Discharge: 2018-08-23 | Disposition: A | Discharge status: Home / Self Care | Payer: PPO | Source: Ambulatory Visit | Attending: Gastroenterology | Admitting: Gastroenterology

## 2018-08-23 ENCOUNTER — Telehealth: Payer: Self-pay | Admitting: Dietician

## 2018-08-23 ENCOUNTER — Ambulatory Visit: Payer: PPO | Admitting: Anesthesiology

## 2018-08-23 ENCOUNTER — Ambulatory Visit: Payer: PPO

## 2018-08-23 ENCOUNTER — Encounter
Admission: RE | Disposition: A | Discharge status: Home / Self Care | Payer: Self-pay | Source: Ambulatory Visit | Attending: Gastroenterology

## 2018-08-23 DIAGNOSIS — F17210 Nicotine dependence, cigarettes, uncomplicated: Secondary | ICD-10-CM | POA: Diagnosis not present

## 2018-08-23 DIAGNOSIS — I10 Essential (primary) hypertension: Secondary | ICD-10-CM | POA: Diagnosis not present

## 2018-08-23 DIAGNOSIS — Z1211 Encounter for screening for malignant neoplasm of colon: Secondary | ICD-10-CM | POA: Diagnosis not present

## 2018-08-23 DIAGNOSIS — D123 Benign neoplasm of transverse colon: Secondary | ICD-10-CM | POA: Diagnosis not present

## 2018-08-23 DIAGNOSIS — K5730 Diverticulosis of large intestine without perforation or abscess without bleeding: Secondary | ICD-10-CM | POA: Diagnosis not present

## 2018-08-23 DIAGNOSIS — K635 Polyp of colon: Secondary | ICD-10-CM | POA: Diagnosis not present

## 2018-08-23 DIAGNOSIS — K621 Rectal polyp: Secondary | ICD-10-CM

## 2018-08-23 DIAGNOSIS — E119 Type 2 diabetes mellitus without complications: Secondary | ICD-10-CM | POA: Insufficient documentation

## 2018-08-23 DIAGNOSIS — Z7984 Long term (current) use of oral hypoglycemic drugs: Secondary | ICD-10-CM | POA: Insufficient documentation

## 2018-08-23 DIAGNOSIS — Z79899 Other long term (current) drug therapy: Secondary | ICD-10-CM | POA: Diagnosis not present

## 2018-08-23 DIAGNOSIS — D125 Benign neoplasm of sigmoid colon: Secondary | ICD-10-CM | POA: Diagnosis not present

## 2018-08-23 HISTORY — PX: COLONOSCOPY WITH PROPOFOL: SHX5780

## 2018-08-23 HISTORY — PX: POLYPECTOMY: SHX5525

## 2018-08-23 LAB — GLUCOSE, CAPILLARY
Glucose-Capillary: 147 mg/dL — ABNORMAL HIGH (ref 70–99)
Glucose-Capillary: 126 mg/dL — ABNORMAL HIGH (ref 70–99)

## 2018-08-23 SURGERY — COLONOSCOPY WITH PROPOFOL
Anesthesia: General | Site: Rectum | Wound class: Dirty or Infected

## 2018-08-23 MED ORDER — LIDOCAINE HCL (CARDIAC) PF 100 MG/5ML IV SOSY
PREFILLED_SYRINGE | INTRAVENOUS | Status: DC | PRN
  Administered 2018-08-23: 40 mg via INTRAVENOUS

## 2018-08-23 MED ORDER — LACTATED RINGERS IV SOLN
INTRAVENOUS | Status: DC
  Administered 2018-08-23: 08:00:00 via INTRAVENOUS

## 2018-08-23 MED ORDER — ACETAMINOPHEN 325 MG PO TABS
650.0000 mg | ORAL_TABLET | Freq: Once | ORAL | Status: AC
  Administered 2018-08-23: 650 mg via ORAL

## 2018-08-23 MED ORDER — PROPOFOL 10 MG/ML IV BOLUS
INTRAVENOUS | Status: DC | PRN
  Administered 2018-08-23: 30 mg via INTRAVENOUS
  Administered 2018-08-23 (×4): 20 mg via INTRAVENOUS
  Administered 2018-08-23 (×2): 30 mg via INTRAVENOUS
  Administered 2018-08-23: 20 mg via INTRAVENOUS
  Administered 2018-08-23: 100 mg via INTRAVENOUS
  Administered 2018-08-23: 40 mg via INTRAVENOUS
  Administered 2018-08-23: 30 mg via INTRAVENOUS

## 2018-08-23 SURGICAL SUPPLY — 16 items
CANISTER SUCT 1200ML W/VALVE (MISCELLANEOUS) ×3 IMPLANT
CLIP HMST 235XBRD CATH ROT (MISCELLANEOUS) ×1 IMPLANT
CLIP RESOLUTION 360 11X235 (MISCELLANEOUS) ×2
ELECT REM PT RETURN 9FT ADLT (ELECTROSURGICAL)
ELECTRODE REM PT RTRN 9FT ADLT (ELECTROSURGICAL) IMPLANT
ELEVIEW  INJECTABLE COMP 10 (MISCELLANEOUS) ×2
FORCEPS BIOP RAD 4 LRG CAP 4 (CUTTING FORCEPS) ×3 IMPLANT
GOWN CVR UNV OPN BCK APRN NK (MISCELLANEOUS) ×2 IMPLANT
GOWN ISOL THUMB LOOP REG UNIV (MISCELLANEOUS) ×4
INJECTABLE ELEVIEW COMP 10 (MISCELLANEOUS) ×1 IMPLANT
INJECTOR VARIJECT VIN23 (MISCELLANEOUS) ×1 IMPLANT
KIT ENDO PROCEDURE OLY (KITS) ×3 IMPLANT
PROBE APC STR FIRE (PROBE) IMPLANT
SNARE SHORT THROW 30M LRG OVAL (MISCELLANEOUS) IMPLANT
VARIJECT INJECTOR VIN23 (MISCELLANEOUS) ×3
WATER STERILE IRR 250ML POUR (IV SOLUTION) ×3 IMPLANT

## 2018-08-23 NOTE — Anesthesia Postprocedure Evaluation (Signed)
Anesthesia Post Note  Patient: Erica Ruiz [facility]  Procedure(s) Performed: COLONOSCOPY WITH PROPOFOL with biopsies (N/A Rectum) POLYPECTOMY (N/A Rectum)  Patient location during evaluation: PACU Anesthesia Type: General Level of consciousness: awake Pain management: pain level controlled Vital Signs Assessment: post-procedure vital signs reviewed and stable Respiratory status: spontaneous breathing Cardiovascular status: blood pressure returned to baseline Postop Assessment: no headache Anesthetic complications: no    Lavonna Monarch

## 2018-08-23 NOTE — Telephone Encounter (Signed)
Called patient due to missing class this evening; she reports having had colonoscopy today and unable to come to class. She states she will call back later to reschedule. Informed patient of the next class series beginning 09/20/18.

## 2018-08-23 NOTE — H&P (Signed)
Lucilla Lame, MD [address] [place], [place] [address] Phone: [phone] Fax : [phone]  Primary Care Physician:  Juline Patch, MD Primary Gastroenterologist:  Dr. Allen Norris  Pre-Procedure History & Physical: HPI:  Erica Ruiz is a 57 y.o. female is here for a screening colonoscopy.   Past Medical History:  Diagnosis Date  . Back pain   . Diabetes mellitus without complication ([place])   . Hypertension     Past Surgical History:  Procedure Laterality Date  . CHOLECYSTECTOMY    . COLON SURGERY    . COLOSTOMY REVERSAL    . KNEE SURGERY    . TONSILLECTOMY    . TUBAL LIGATION      Prior to Admission medications   Medication Sig Start Date End Date Taking? Authorizing Provider  DULoxetine (CYMBALTA) 30 MG capsule TK ONE C PO ONCE A DAY FOR 90 DAYS- pain management 03/18/18  Yes [provider]  empagliflozin (JARDIANCE) 25 MG TABS tablet Take 25 mg by mouth daily. Hilary 06/16/18  Yes [provider]  gabapentin (NEURONTIN) 600 MG tablet TK 1 T PO BID- pain management 03/19/18  Yes [provider]  lisinopril (PRINIVIL,ZESTRIL) 10 MG tablet Take 1 tablet (10 mg total) by mouth daily. 05/28/18  Yes Juline Patch, MD  morphine (MS CONTIN) 15 MG 12 hr tablet TK 1 T PO  Q 8 H FOR 30 DAYS- pain management 03/27/18  Yes [provider]  naproxen sodium (ALEVE) 220 MG tablet Take 220 mg by mouth every 12 (twelve) hours as needed. 09/19/10  Yes [provider]  oxyCODONE-acetaminophen (PERCOCET) 7.5-325 MG tablet TK 1 T PO Q 8 H PRN- pain management 03/27/18  Yes [provider]  ranitidine (ZANTAC) 150 MG tablet Take 150 mg by mouth 2 (two) times daily.   Yes [provider]  tiZANidine (ZANAFLEX) 4 MG tablet TK 1 T PO Q 8 H PRF SPASMS- pain management 03/18/18  Yes [provider]  hydrochlorothiazide (HYDRODIURIL) 25 MG tablet Take 1 tablet (25 mg total) by mouth daily. 05/28/18 08/04/18  Juline Patch, MD  metFORMIN (GLUCOPHAGE-XR) 500 MG 24 hr tablet Take 1,000 mg by mouth 2 (two) times daily. Hilary 06/16/18   [provider]  PREVIDENT 5000 ENAMEL PROTECT 1.1-5 % PSTE  03/02/18   [provider]    Allergies as of 08/04/2018  . (No Known Allergies)    Family History  Problem Relation Age of Onset  . Pulmonary embolism Mother   . Cancer Father        prostate  . Heart disease Father   . Hypertension Father   . Cancer Sister        non-hodgkin's lymphoma  . Hepatitis C Sister   . Cancer Brother        colon  . Diabetes Brother   . Heart disease Brother   . Hypertension Brother   . Diabetes Daughter   . Diabetes Brother   . Hypertension Sister   . Cancer Brother        prostate    Social History   Socioeconomic History  . Marital status: Married    Spouse name: Not on file  . Number of children: 4  . Years of education: Not on file  . Highest education level: 12th grade  Occupational History  . Occupation: Disabled  Social Needs  . Financial resource strain: Not hard at all  . Food insecurity:  Worry: Never true    Inability: Never true  . Transportation needs:    Medical: No    Non-medical: No  Tobacco Use  . Smoking status: Current Every Day Smoker    Packs/day: 0.50    Years: 25.00    Pack years: 12.50    Types: Cigarettes  . Smokeless tobacco: Never Used  Substance and Sexual Activity  . Alcohol use: Never    Frequency: Never  . Drug use: Never  . Sexual activity: Not Currently    Birth control/protection: Spermicide  Lifestyle  . Physical activity:    Days per week: 0 days    Minutes per session: 0 min  . Stress: Not at all  Relationships  . Social connections:    Talks on phone: Patient refused    Gets together: Patient refused    Attends religious service: Patient refused    Active member of club or organization: Patient refused    Attends meetings of clubs or organizations: Patient refused    Relationship  status: Married  . Intimate partner violence:    Fear of current or ex partner: No    Emotionally abused: No    Physically abused: No    Forced sexual activity: No  Other Topics Concern  . Not on file  Social History Narrative  . Not on file    Review of Systems: See HPI, otherwise negative ROS  Physical Exam: BP 118/75   Pulse 91   Temp 98.2 F (36.8 C)   Ht 5\' 1"  (1.549 m)   Wt 104.3 kg   SpO2 97%   BMI 43.46 kg/m  General:   Alert,  pleasant and cooperative in NAD Head:  Normocephalic and atraumatic. Neck:  Supple; no masses or thyromegaly. Lungs:  Clear throughout to auscultation.    Heart:  Regular rate and rhythm. Abdomen:  Soft, nontender and nondistended. Normal bowel sounds, without guarding, and without rebound.   Neurologic:  Alert and  oriented x4;  grossly normal neurologically.  Impression/Plan: Erica Ruiz is now here to undergo a screening colonoscopy.  Risks, benefits, and alternatives regarding colonoscopy have been reviewed with the patient.  Questions have been answered.  All parties agreeable.

## 2018-08-23 NOTE — Anesthesia Procedure Notes (Signed)
Procedure Name: MAC Date/Time: 08/23/2018 8:33 AM Performed by: Janna Arch, CRNA Pre-anesthesia Checklist: Patient identified, Emergency Drugs available, Suction available, Timeout performed and Patient being monitored Patient Re-evaluated:Patient Re-evaluated prior to induction Oxygen Delivery Method: Nasal cannula Placement Confirmation: positive ETCO2

## 2018-08-23 NOTE — Anesthesia Preprocedure Evaluation (Addendum)
Anesthesia Evaluation  Patient identified by MRN, date of birth, ID band Patient awake    Reviewed: Allergy & Precautions, NPO status , Patient's Chart, lab work & pertinent test results, reviewed documented beta blocker date and time   Airway Mallampati: I  TM Distance: >3 FB Neck ROM: Full    Dental no notable dental hx.    Pulmonary Current Smoker,    Pulmonary exam normal breath sounds clear to auscultation       Cardiovascular hypertension, Normal cardiovascular exam Rhythm:Regular Rate:Normal     Neuro/Psych negative neurological ROS  negative psych ROS   GI/Hepatic negative GI ROS, Neg liver ROS,   Endo/Other  diabetesMorbid obesity  Renal/GU   negative genitourinary   Musculoskeletal negative musculoskeletal ROS (+)   Abdominal (+) + obese,   Peds  Hematology negative hematology ROS (+)   Anesthesia Other Findings   Reproductive/Obstetrics                            Anesthesia Physical Anesthesia Plan  ASA: III  Anesthesia Plan: General   Post-op Pain Management:    Induction: Intravenous  PONV Risk Score and Plan:   Airway Management Planned: Natural Airway  Additional Equipment: None  Intra-op Plan:   Post-operative Plan:   Informed Consent: I have reviewed the patients History and Physical, chart, labs and discussed the procedure including the risks, benefits and alternatives for the proposed anesthesia with the patient or authorized representative who has indicated his/her understanding and acceptance.     Plan Discussed with: CRNA, Anesthesiologist and Surgeon  Anesthesia Plan Comments:         Anesthesia Quick Evaluation

## 2018-08-23 NOTE — Op Note (Signed)
[facility] Patient Name: Erica Ruiz Procedure Date: 08/23/2018 8:29 AM MRN: [id number] Account #: [id number] Date of Birth: Feb 04, 1961 Admit Type: Outpatient Age: 57 Room: [facility] OR ROOM 01 Gender: Female Note Status: Finalized Procedure:            Colonoscopy Indications:          Screening for colorectal malignant neoplasm Providers:            Lucilla Lame MD, MD Referring MD:         Juline Patch, MD (Referring MD) Medicines:            Propofol per Anesthesia Complications:        No immediate complications. Procedure:            Pre-Anesthesia Assessment:                       - Prior to the procedure, a History and Physical was                        performed, and patient medications and allergies were                        reviewed. The patient's tolerance of previous                        anesthesia was also reviewed. The risks and benefits of                        the procedure and the sedation options and risks were                        discussed with the patient. All questions were                        answered, and informed consent was obtained. Prior                        Anticoagulants: The patient has taken no previous                        anticoagulant or antiplatelet agents. ASA Grade                        Assessment: II - A patient with mild systemic disease.                        After reviewing the risks and benefits, the patient was                        deemed in satisfactory condition to undergo the                        procedure.                       After obtaining informed consent, the colonoscope was                        passed under direct vision. Throughout the procedure,  the patient's blood pressure, pulse, and oxygen                        saturations were monitored continuously. The was                        introduced through the anus and advanced to the the                         cecum, identified by appendiceal orifice and ileocecal                        valve. The colonoscopy was performed without                        difficulty. The patient tolerated the procedure well.                        The quality of the bowel preparation was good. Findings:      The perianal and digital rectal examinations were normal.      A 9 mm polyp was found in the splenic flexure. The polyp was sessile.       Area was successfully injected with 4 mL saline with indigo carmine for       a lift polypectomy. The polyp was removed with a hot snare. Resection       and retrieval were complete.      A 5 mm polyp was found in the splenic flexure. The polyp was sessile.       The polyp was removed with a hot snare. Resection and retrieval were       complete.      Two sessile polyps were found in the sigmoid colon. The polyps were 2 to       3 mm in size. These polyps were removed with a cold biopsy forceps.       Resection and retrieval were complete.      A 2 mm polyp was found in the rectum. The polyp was sessile. The polyp       was removed with a cold biopsy forceps. Resection and retrieval were       complete.      A few small-mouthed diverticula were found in the entire colon.      There was evidence of a prior end-to-side colo-colonic anastomosis in       the sigmoid colon. This was patent and was characterized by healthy       appearing mucosa. The anastomosis was traversed. Impression:           - One 9 mm polyp at the splenic flexure, removed with a                        hot snare. Resected and retrieved. Injected.                       - One 5 mm polyp at the splenic flexure, removed with a                        hot snare. Resected and retrieved.                       -  Two 2 to 3 mm polyps in the sigmoid colon, removed                        with a cold biopsy forceps. Resected and retrieved.                       - One 2 mm polyp in the rectum, removed  with a cold                        biopsy forceps. Resected and retrieved.                       - Diverticulosis in the entire examined colon.                       - Patent end-to-side colo-colonic anastomosis,                        characterized by healthy appearing mucosa. Recommendation:       - Discharge patient to home.                       - Resume previous diet.                       - Continue present medications.                       - Await pathology results.                       - Repeat colonoscopy in 5 years if polyp adenoma and 10                        years if hyperplastic Procedure Code(s):    --- Professional ---                       [phone], Colonoscopy, flexible; with removal of tumor(s),                        polyp(s), or other lesion(s) by snare technique                       [id number], Colonoscopy, flexible; with directed submucosal                        injection(s), any substance                       [id number], 59, Colonoscopy, flexible; with biopsy, single                        or multiple Diagnosis Code(s):    --- Professional ---                       Z12.11, Encounter for screening for malignant neoplasm                        of colon                       D12.3, Benign neoplasm  of transverse colon (hepatic                        flexure or splenic flexure)                       K62.1, Rectal polyp                       D12.5, Benign neoplasm of sigmoid colon CPT copyright 2017 American Medical Association. All rights reserved. The codes documented in this report are preliminary and upon coder review may  be revised to meet current compliance requirements. Lucilla Lame MD, MD 08/23/2018 9:03:23 AM This report has been signed electronically. Number of Addenda: 0 Note Initiated On: 08/23/2018 8:29 AM Scope Withdrawal Time: 0 hours 14 minutes 35 seconds  Total Procedure Duration: 0 hours 19 minutes 56 seconds       [facility]

## 2018-08-23 NOTE — Transfer of Care (Signed)
Immediate Anesthesia Transfer of Care Note  Patient: Erica Ruiz [facility]  Procedure(s) Performed: COLONOSCOPY WITH PROPOFOL with biopsies (N/A Rectum) POLYPECTOMY (N/A Rectum)  Patient Location: PACU  Anesthesia Type: General  Level of Consciousness: awake, alert  and patient cooperative  Airway and Oxygen Therapy: Patient Spontanous Breathing and Patient connected to supplemental oxygen  Post-op Assessment: Post-op Vital signs reviewed, Patient's Cardiovascular Status Stable, Respiratory Function Stable, Patent Airway and No signs of Nausea or vomiting  Post-op Vital Signs: Reviewed and stable  Complications: No apparent anesthesia complications

## 2018-08-24 ENCOUNTER — Encounter: Payer: Self-pay | Admitting: Gastroenterology

## 2018-08-25 ENCOUNTER — Encounter: Payer: Self-pay | Admitting: Gastroenterology

## 2018-08-26 ENCOUNTER — Encounter: Payer: Self-pay | Admitting: Gastroenterology

## 2018-08-30 ENCOUNTER — Ambulatory Visit: Payer: PPO

## 2018-09-01 DIAGNOSIS — Z1231 Encounter for screening mammogram for malignant neoplasm of breast: Secondary | ICD-10-CM | POA: Diagnosis not present

## 2018-09-02 ENCOUNTER — Other Ambulatory Visit: Payer: Self-pay

## 2018-09-06 ENCOUNTER — Ambulatory Visit: Payer: PPO

## 2018-09-08 DIAGNOSIS — I10 Essential (primary) hypertension: Secondary | ICD-10-CM | POA: Diagnosis not present

## 2018-09-08 DIAGNOSIS — E1165 Type 2 diabetes mellitus with hyperglycemia: Secondary | ICD-10-CM | POA: Diagnosis not present

## 2018-09-08 DIAGNOSIS — E1159 Type 2 diabetes mellitus with other circulatory complications: Secondary | ICD-10-CM | POA: Diagnosis not present

## 2018-09-08 DIAGNOSIS — F17200 Nicotine dependence, unspecified, uncomplicated: Secondary | ICD-10-CM | POA: Diagnosis not present

## 2018-09-17 DIAGNOSIS — Z79899 Other long term (current) drug therapy: Secondary | ICD-10-CM | POA: Diagnosis not present

## 2018-09-17 DIAGNOSIS — M25562 Pain in left knee: Secondary | ICD-10-CM | POA: Diagnosis not present

## 2018-09-17 DIAGNOSIS — M25561 Pain in right knee: Secondary | ICD-10-CM | POA: Diagnosis not present

## 2018-09-17 DIAGNOSIS — M4716 Other spondylosis with myelopathy, lumbar region: Secondary | ICD-10-CM | POA: Diagnosis not present

## 2018-09-17 DIAGNOSIS — F17200 Nicotine dependence, unspecified, uncomplicated: Secondary | ICD-10-CM | POA: Insufficient documentation

## 2018-09-17 DIAGNOSIS — G894 Chronic pain syndrome: Secondary | ICD-10-CM | POA: Diagnosis not present

## 2018-09-17 DIAGNOSIS — E1165 Type 2 diabetes mellitus with hyperglycemia: Secondary | ICD-10-CM | POA: Insufficient documentation

## 2018-09-17 DIAGNOSIS — Z79891 Long term (current) use of opiate analgesic: Secondary | ICD-10-CM | POA: Diagnosis not present

## 2018-09-17 DIAGNOSIS — M5116 Intervertebral disc disorders with radiculopathy, lumbar region: Secondary | ICD-10-CM | POA: Diagnosis not present

## 2018-09-21 ENCOUNTER — Encounter: Payer: Self-pay | Admitting: *Deleted

## 2018-10-06 ENCOUNTER — Other Ambulatory Visit: Payer: Self-pay

## 2018-10-06 DIAGNOSIS — E119 Type 2 diabetes mellitus without complications: Secondary | ICD-10-CM

## 2018-10-06 MED ORDER — ONETOUCH ULTRASOFT LANCETS MISC: 1.0000 | Freq: Two times a day (BID) | 0 refills | Status: AC

## 2018-10-06 MED ORDER — GLUCOSE BLOOD VI STRP: 1.0000 | ORAL_STRIP | Freq: Two times a day (BID) | 0 refills | Status: AC

## 2018-10-06 NOTE — Progress Notes (Unsigned)
Sent strips and lancets in- LM on pt's phone to get Warnell Forester to pick up after this

## 2018-10-12 DIAGNOSIS — M545 Low back pain: Secondary | ICD-10-CM | POA: Diagnosis not present

## 2018-10-14 ENCOUNTER — Encounter: Payer: Self-pay | Admitting: Family Medicine

## 2018-10-14 ENCOUNTER — Ambulatory Visit (INDEPENDENT_AMBULATORY_CARE_PROVIDER_SITE_OTHER): Payer: PPO | Admitting: Family Medicine

## 2018-10-14 VITALS — BP 120/70 | HR 76 | Ht 61.0 in | Wt 228.0 lb

## 2018-10-14 DIAGNOSIS — F17200 Nicotine dependence, unspecified, uncomplicated: Secondary | ICD-10-CM

## 2018-10-14 DIAGNOSIS — Z23 Encounter for immunization: Secondary | ICD-10-CM | POA: Diagnosis not present

## 2018-10-14 DIAGNOSIS — I10 Essential (primary) hypertension: Secondary | ICD-10-CM | POA: Diagnosis not present

## 2018-10-14 MED ORDER — LISINOPRIL 10 MG PO TABS: 10.0000 mg | ORAL_TABLET | Freq: Every day | ORAL | 3 refills | Status: DC

## 2018-10-14 MED ORDER — HYDROCHLOROTHIAZIDE 25 MG PO TABS: 25.0000 mg | ORAL_TABLET | Freq: Every day | ORAL | 1 refills | Status: DC

## 2018-10-14 NOTE — Patient Instructions (Signed)
Steps to Quit Smoking Smoking tobacco can be bad for your health. It can also affect almost every organ in your body. Smoking puts you and people around you at risk for many serious long-lasting (chronic) diseases. Quitting smoking is hard, but it is one of the best things that you can do for your health. It is never too late to quit. What are the benefits of quitting smoking? When you quit smoking, you lower your risk for getting serious diseases and conditions. They can include:  Lung cancer or lung disease.  Heart disease.  Stroke.  Heart attack.  Not being able to have children (infertility).  Weak bones (osteoporosis) and broken bones (fractures).  If you have coughing, wheezing, and shortness of breath, those symptoms may get better when you quit. You may also get sick less often. If you are pregnant, quitting smoking can help to lower your chances of having a baby of low birth weight. What can I do to help me quit smoking? Talk with your doctor about what can help you quit smoking. Some things you can do (strategies) include:  Quitting smoking totally, instead of slowly cutting back how much you smoke over a period of time.  Going to in-person counseling. You are more likely to quit if you go to many counseling sessions.  Using resources and support systems, such as: ? Online chats with a counselor. ? Phone quitlines. ? Printed self-help materials. ? Support groups or group counseling. ? Text messaging programs. ? Mobile phone apps or applications.  Taking medicines. Some of these medicines may have nicotine in them. If you are pregnant or breastfeeding, do not take any medicines to quit smoking unless your doctor says it is okay. Talk with your doctor about counseling or other things that can help you.  Talk with your doctor about using more than one strategy at the same time, such as taking medicines while you are also going to in-person counseling. This can help make  quitting easier. What things can I do to make it easier to quit? Quitting smoking might feel very hard at first, but there is a lot that you can do to make it easier. Take these steps:  Talk to your family and friends. Ask them to support and encourage you.  Call phone quitlines, reach out to support groups, or work with a counselor.  Ask people who smoke to not smoke around you.  Avoid places that make you want (trigger) to smoke, such as: ? Bars. ? Parties. ? Smoke-break areas at work.  Spend time with people who do not smoke.  Lower the stress in your life. Stress can make you want to smoke. Try these things to help your stress: ? Getting regular exercise. ? Deep-breathing exercises. ? Yoga. ? Meditating. ? Doing a body scan. To do this, close your eyes, focus on one area of your body at a time from head to toe, and notice which parts of your body are tense. Try to relax the muscles in those areas.  Download or buy apps on your mobile phone or tablet that can help you stick to your quit plan. There are many free apps, such as QuitGuide from the CDC (Centers for Disease Control and Prevention). You can find more support from smokefree.gov and other websites.  This information is not intended to replace advice given to you by your health care provider. Make sure you discuss any questions you have with your health care provider. Document Released: 09/27/2009 Document   Revised: 07/29/2016 Document Reviewed: 04/17/2015 Elsevier Interactive Patient Education  2018 Elsevier Inc.  

## 2018-10-14 NOTE — Progress Notes (Signed)
Date:  10/14/2018   Name:  Erica Ruiz [facility]   DOB:  January 07, 1961   MRN:  [id number]   Chief Complaint: Hypertension and Flu Vaccine Hypertension  This is a chronic problem. The current episode started more than 1 year ago. The problem has been waxing and waning since onset. The problem is controlled. Pertinent negatives include no anxiety, blurred vision, chest pain, headaches, malaise/fatigue, neck pain, orthopnea, palpitations, peripheral edema, PND, shortness of breath or sweats. There are no associated agents to hypertension. Risk factors for coronary artery disease include obesity, smoking/tobacco exposure, post-menopausal state and dyslipidemia. Past treatments include ACE inhibitors and diuretics. The current treatment provides moderate improvement. There are no compliance problems.  There is no history of angina, kidney disease, CAD/MI, CVA, heart failure, left ventricular hypertrophy, PVD or retinopathy. There is no history of chronic renal disease, a hypertension causing med or renovascular disease.     Review of Systems  Constitutional: Negative.  Negative for chills, fatigue, fever, malaise/fatigue and unexpected weight change.  HENT: Negative for congestion, ear discharge, ear pain, rhinorrhea, sinus pressure, sneezing and sore throat.   Eyes: Negative for blurred vision, photophobia, pain, discharge, redness and itching.  Respiratory: Negative for cough, shortness of breath, wheezing and stridor.   Cardiovascular: Negative for chest pain, palpitations, orthopnea and PND.  Gastrointestinal: Negative for abdominal pain, blood in stool, constipation, diarrhea, nausea and vomiting.  Endocrine: Negative for cold intolerance, heat intolerance, polydipsia, polyphagia and polyuria.  Genitourinary: Negative for dysuria, flank pain, frequency, hematuria, menstrual problem, pelvic pain, urgency, vaginal bleeding and vaginal discharge.  Musculoskeletal: Negative for arthralgias, back pain,  myalgias and neck pain.  Skin: Negative for rash.  Allergic/Immunologic: Negative for environmental allergies and food allergies.  Neurological: Negative for dizziness, weakness, light-headedness, numbness and headaches.  Hematological: Negative for adenopathy. Does not bruise/bleed easily.  Psychiatric/Behavioral: Negative for dysphoric mood. The patient is not nervous/anxious.     Patient Active Problem List   Diagnosis Date Noted  . Special screening for malignant neoplasms, colon   . Benign neoplasm of transverse colon   . Polyp of sigmoid colon   . Rectal polyp   . Hyperglycemia 04/16/2018  . Essential hypertension 04/16/2018  . Venous insufficiency 04/16/2018  . Abnormal weight gain 04/16/2018    No Known Allergies  Past Surgical History:  Procedure Laterality Date  . CHOLECYSTECTOMY    . COLON SURGERY    . COLONOSCOPY WITH PROPOFOL N/A 08/23/2018   Procedure: COLONOSCOPY WITH PROPOFOL with biopsies;  Surgeon: Lucilla Lame, MD;  Location: [facility];  Service: Endoscopy;  Laterality: N/A;  DIABETIC  . COLOSTOMY REVERSAL    . KNEE SURGERY    . POLYPECTOMY N/A 08/23/2018   Procedure: POLYPECTOMY;  Surgeon: Lucilla Lame, MD;  Location: [facility];  Service: Endoscopy;  Laterality: N/A;  . TONSILLECTOMY    . TUBAL LIGATION      Social History   Tobacco Use  . Smoking status: Current Every Day Smoker    Packs/day: 0.50    Years: 25.00    Pack years: 12.50    Types: Cigarettes  . Smokeless tobacco: Never Used  Substance Use Topics  . Alcohol use: Never    Frequency: Never  . Drug use: Never     Medication list has been reviewed and updated.  Current Meds  Medication Sig  . DULoxetine (CYMBALTA) 30 MG capsule TK ONE C PO ONCE A DAY FOR 90 DAYS- pain management  . empagliflozin (JARDIANCE) 25  MG TABS tablet Take 25 mg by mouth daily. Hilary  . gabapentin (NEURONTIN) 600 MG tablet TK 1 T PO BID- pain management  . glucose blood test strip 1 each  by Other route 2 (two) times daily.  . hydrochlorothiazide (HYDRODIURIL) 25 MG tablet Take 1 tablet (25 mg total) by mouth daily.  . Lancets (ONETOUCH ULTRASOFT) lancets 1 each by Other route 2 (two) times daily.  Marland Kitchen lisinopril (PRINIVIL,ZESTRIL) 10 MG tablet Take 1 tablet (10 mg total) by mouth daily.  . metFORMIN (GLUCOPHAGE-XR) 500 MG 24 hr tablet Take 1,000 mg by mouth 2 (two) times daily. Hilary  . morphine (MS CONTIN) 15 MG 12 hr tablet TK 1 T PO  Q 8 H FOR 30 DAYS- pain management  . naproxen sodium (ALEVE) 220 MG tablet Take 220 mg by mouth every 12 (twelve) hours as needed.  Marland Kitchen oxyCODONE-acetaminophen (PERCOCET) 7.5-325 MG tablet TK 1 T PO Q 8 H PRN- pain management  . PREVIDENT 5000 ENAMEL PROTECT 1.1-5 % PSTE   . ranitidine (ZANTAC) 150 MG tablet Take 150 mg by mouth 2 (two) times daily. otc  . tiZANidine (ZANAFLEX) 4 MG tablet TK 1 T PO Q 8 H PRF SPASMS- pain management    PHQ 2/9 Scores 10/14/2018 08/04/2018 07/28/2018 06/28/2018  PHQ - 2 Score 0 0 0 0  PHQ- 9 Score 1 0 0 -    Physical Exam  Constitutional: No distress.  HENT:  Head: Normocephalic and atraumatic.  Right Ear: External ear normal.  Left Ear: External ear normal.  Nose: Nose normal.  Mouth/Throat: Oropharynx is clear and moist.  Eyes: Pupils are equal, round, and reactive to light. Conjunctivae and EOM are normal. Right eye exhibits no discharge. Left eye exhibits no discharge.  Neck: Normal range of motion. Neck supple. No JVD present. No thyromegaly present.  Cardiovascular: Normal rate, regular rhythm, S1 normal, S2 normal, normal heart sounds and intact distal pulses. Exam reveals no gallop, no S3, no S4, no distant heart sounds and no friction rub.  No murmur heard.  No systolic murmur is present.  No diastolic murmur is present. Pulmonary/Chest: Effort normal and breath sounds normal. She has no wheezes. She has no rales.  Abdominal: Soft. Bowel sounds are normal. She exhibits no mass. There is no  tenderness. There is no guarding.  Musculoskeletal: Normal range of motion. She exhibits no edema.  Lymphadenopathy:    She has no cervical adenopathy.  Neurological: She is alert. She has normal reflexes.  Skin: Skin is warm and dry. She is not diaphoretic.  Nursing note and vitals reviewed.   BP 120/70   Pulse 76   Ht 5\' 1"  (1.549 m)   Wt 228 lb (103.4 kg)   BMI 43.08 kg/m   Assessment and Plan: 1. Tobacco dependence Patient has been advised of the health risks of smoking and counseled concerning cessation of tobacco products. I spent over 3 minutes for discussion and to answer questions.  2. Essential hypertension Chronic Controlled. Continue lisinopril 10 mg and HCTZ 25 mg daily. - hydrochlorothiazide (HYDRODIURIL) 25 MG tablet; Take 1 tablet (25 mg total) by mouth daily.  Dispense: 90 tablet; Refill: 1 - lisinopril (PRINIVIL,ZESTRIL) 10 MG tablet; Take 1 tablet (10 mg total) by mouth daily.  Dispense: 90 tablet; Refill: 3 - Renal Function Panel  3. Influenza vaccine needed Discussed administered. - Flu Vaccine QUAD 36+ mos IM     Dr. Otilio Miu [facility]  10/14/2018

## 2018-10-15 LAB — RENAL FUNCTION PANEL
Albumin: 4.5 g/dL (ref 3.5–5.5)
BUN/Creatinine Ratio: 15 (ref 9–23)
BUN: 11 mg/dL (ref 6–24)
CALCIUM: 9.6 mg/dL (ref 8.7–10.2)
CHLORIDE: 101 mmol/L (ref 96–106)
CO2: 23 mmol/L (ref 20–29)
Creatinine, Ser: 0.73 mg/dL (ref 0.57–1.00)
GFR calc Af Amer: 106 mL/min/{1.73_m2} (ref >59)
GFR calc non Af Amer: 92 mL/min/{1.73_m2} (ref >59)
GLUCOSE: 110 mg/dL — AB (ref 65–99)
PHOSPHORUS: 4.7 mg/dL — AB (ref 2.5–4.5)
POTASSIUM: 4.3 mmol/L (ref 3.5–5.2)
SODIUM: 140 mmol/L (ref 134–144)

## 2018-11-16 DIAGNOSIS — Z79891 Long term (current) use of opiate analgesic: Secondary | ICD-10-CM | POA: Diagnosis not present

## 2018-11-16 DIAGNOSIS — M25562 Pain in left knee: Secondary | ICD-10-CM | POA: Diagnosis not present

## 2018-11-16 DIAGNOSIS — M5116 Intervertebral disc disorders with radiculopathy, lumbar region: Secondary | ICD-10-CM | POA: Diagnosis not present

## 2018-11-16 DIAGNOSIS — Z79899 Other long term (current) drug therapy: Secondary | ICD-10-CM | POA: Diagnosis not present

## 2018-11-16 DIAGNOSIS — G894 Chronic pain syndrome: Secondary | ICD-10-CM | POA: Diagnosis not present

## 2018-11-16 DIAGNOSIS — M25561 Pain in right knee: Secondary | ICD-10-CM | POA: Diagnosis not present

## 2018-11-16 DIAGNOSIS — M4716 Other spondylosis with myelopathy, lumbar region: Secondary | ICD-10-CM | POA: Diagnosis not present

## 2018-12-13 DIAGNOSIS — F17200 Nicotine dependence, unspecified, uncomplicated: Secondary | ICD-10-CM | POA: Diagnosis not present

## 2018-12-13 DIAGNOSIS — E1165 Type 2 diabetes mellitus with hyperglycemia: Secondary | ICD-10-CM | POA: Diagnosis not present

## 2018-12-13 DIAGNOSIS — E1159 Type 2 diabetes mellitus with other circulatory complications: Secondary | ICD-10-CM | POA: Diagnosis not present

## 2018-12-13 DIAGNOSIS — I10 Essential (primary) hypertension: Secondary | ICD-10-CM | POA: Diagnosis not present

## 2018-12-16 ENCOUNTER — Other Ambulatory Visit: Payer: Self-pay

## 2019-01-11 DIAGNOSIS — Z79899 Other long term (current) drug therapy: Secondary | ICD-10-CM | POA: Diagnosis not present

## 2019-01-11 DIAGNOSIS — Z79891 Long term (current) use of opiate analgesic: Secondary | ICD-10-CM | POA: Diagnosis not present

## 2019-01-11 DIAGNOSIS — M25561 Pain in right knee: Secondary | ICD-10-CM | POA: Diagnosis not present

## 2019-01-11 DIAGNOSIS — G894 Chronic pain syndrome: Secondary | ICD-10-CM | POA: Diagnosis not present

## 2019-01-11 DIAGNOSIS — M5116 Intervertebral disc disorders with radiculopathy, lumbar region: Secondary | ICD-10-CM | POA: Diagnosis not present

## 2019-01-11 DIAGNOSIS — M25562 Pain in left knee: Secondary | ICD-10-CM | POA: Diagnosis not present

## 2019-01-11 DIAGNOSIS — M4716 Other spondylosis with myelopathy, lumbar region: Secondary | ICD-10-CM | POA: Diagnosis not present

## 2019-03-08 DIAGNOSIS — Z79899 Other long term (current) drug therapy: Secondary | ICD-10-CM | POA: Diagnosis not present

## 2019-03-08 DIAGNOSIS — M5116 Intervertebral disc disorders with radiculopathy, lumbar region: Secondary | ICD-10-CM | POA: Diagnosis not present

## 2019-03-08 DIAGNOSIS — M4716 Other spondylosis with myelopathy, lumbar region: Secondary | ICD-10-CM | POA: Diagnosis not present

## 2019-03-08 DIAGNOSIS — G894 Chronic pain syndrome: Secondary | ICD-10-CM | POA: Diagnosis not present

## 2019-03-08 DIAGNOSIS — M25562 Pain in left knee: Secondary | ICD-10-CM | POA: Diagnosis not present

## 2019-03-08 DIAGNOSIS — Z79891 Long term (current) use of opiate analgesic: Secondary | ICD-10-CM | POA: Diagnosis not present

## 2019-03-08 DIAGNOSIS — M25561 Pain in right knee: Secondary | ICD-10-CM | POA: Diagnosis not present

## 2019-03-22 ENCOUNTER — Telehealth: Payer: PPO | Admitting: Physician Assistant

## 2019-03-22 DIAGNOSIS — R509 Fever, unspecified: Secondary | ICD-10-CM

## 2019-03-22 NOTE — Progress Notes (Signed)
E-Visit for Corona Virus Screening  Based on your current symptoms, you may very well have the virus, however your symptoms are mild. Currently, not all patients are being tested. If the symptoms are mild and there is not a known exposure, performing the test is not indicated.  Coronavirus disease 2019 (COVID-19)is a respiratory illness that can spread from person to person. The virus that causes COVID-19 is a new virus that was first identified in the country of [place] but is now found in multiple other countries and has spread to the [place].  Symptoms associated with the virus are mild to severe fever, cough, and shortness of breath. There is currently no vaccine to protect against COVID-19, and there is no specific antiviral treatment for the virus.   To be considered HIGH RISK for Coronavirus (COVID-19), you have to meet the following criteria:  . Traveled to [place], [place], [place], [place] or [place]; or in the [place] to [place], [place], [place], or [place]; and have fever, cough, and shortness of breath within the last 2 weeks of travel OR  . Been in close contact with a person diagnosed with COVID-19 within the last 2 weeks and have fever, cough, and shortness of breath  . IF YOU DO NOT MEET THESE CRITERIA, YOU ARE CONSIDERED LOW RISK FOR COVID-19.   It is vitally important that if you feel that you have an infection such as this virus or any other virus that you stay home and away from places where you may spread it to others.  You should self-quarantine for 14 days if you have symptoms that could potentially be coronavirus and avoid contact with people age 74 and older.     Please avoid ibuprofen as some preliminary evidence suggest it may worsen the course. You may take acetaminophen (Tylenol) as needed for fever.   Reduce your risk of any infection by using the same precautions used for avoiding the common cold or flu: Wash your hands often with soap and warm  water for at least 20 seconds.  If soap and water are not readily available, use an alcohol-based hand sanitizer with at least 60% alcohol.  If coughing or sneezing, cover your mouth and nose by coughing or sneezing into the elbow areas of your shirt or coat, into a tissue or into your sleeve (not your hands). Avoid shaking hands with others and consider head nods or verbal greetings only.  Avoid touching your eyes,nose, or mouth with unwashed hands. Avoid close contact with people who are sick. Avoid places or events with large numbers of people in one location, like concerts or sporting events. Carefully consider travel plans you have or are making. If you are planning any travel outside or inside the [place], visit the CDC'sTravelers' Health webpagefor the latest health notices. If you have some symptoms but not all symptoms, continue to monitor at home and seek medical attention if your symptoms worsen. If you are having a medical emergency, call 911.  HOME CARE Only take medications as instructed by your medical team. Drink plenty of fluids and get plenty of rest. A steam or ultrasonic humidifier can help if you have congestion.   GET HELP RIGHT AWAY IF: You develop worsening fever. You become short of breath You cough up blood. Your symptoms become more severe MAKE SURE YOU  Understand these instructions. Will watch your condition. Will get help right away if you are not doing well or get worse.  Your e-visit  answers were reviewed by a board certified advanced clinical practitioner to complete your personal care plan.  Depending on the condition, your plan could have included both over the counter or prescription medications.  If there is a problem please reply once you have received a response from your provider. Your safety is important to [place].  If you have drug allergies check your prescription carefully.    You can use MyChart to ask questions about today's visit, request a  non-urgent call back, or ask for a work or school excuse for 24 hours related to this e-Visit. If it has been greater than 24 hours you will need to follow up with your provider, or enter a new e-Visit to address those concerns. You will get an e-mail in the next two days asking about your experience.  I hope that your e-visit has been valuable and will speed your recovery. Thank you for using e-visits.    ===View-only below this line===   ----- Message -----    From: Karren Cobble    Sent: 03/22/2019  1:41 PM EDT      To: E-Visit Mailing List Subject: E-Visit Submission: CoronaVirus ([id number]) Screening  E-Visit Submission: CoronaVirus ([id number]) Screening --------------------------------  Question: Do you have any of the following?  Answer:   Fever  Question: Do you have any of the following additional symptoms?  Answer:   None of these  Question: Have you had a fever? Answer:   Yes  Question: If you are running a fever, please type in your temperature reading Answer:   101.5  Question: How long have you had the fever? Answer:   Just today  Question: Have others in your home or workplace had similar symptoms? Answer:   No  Question: When did your symptoms start? Answer:   today 03/22/2019  Question: Have you recently visited any of the following countries? Answer:   None of these  Question: If you have traveled anywhere in the last  2 months please document where you have visited: Answer:   just grocery store  Question: Have you recently been around others from these countries or visited these countries who have had coughing or fever? Answer:   No  Question: Have you recently been around anyone who has been diagnosed with Corona virus? Answer:   No  Question: Have you been taking any medications? Answer:   Yes  Question: If taking medications for these symptoms, please list the names and whether they are helping or not Answer:   ibuprofen  Question: Are  you treated for any of the following conditions: Asthma, COPD, Diabetes, Renal Failure (on Dialysis), AIDS, any Neuromuscular disease that effects the clearing of secretions, Heart Failure, or Heart Disease? Answer:   Yes  Question: Please enter a phone number where you can be reached if we have additional questions about your symptoms Answer:   [phone]  Question: Please list your medication allergies that you may have ? (If 'none' , please list as 'none') Answer:   none  Question: Please list any additional comments  Answer:   none  A total of 5-10 minutes was spent evaluating this patients questionnaire and formulating a plan of care.

## 2019-04-15 ENCOUNTER — Other Ambulatory Visit: Payer: Self-pay | Admitting: Family Medicine

## 2019-04-15 DIAGNOSIS — I10 Essential (primary) hypertension: Secondary | ICD-10-CM

## 2019-05-03 DIAGNOSIS — Z79891 Long term (current) use of opiate analgesic: Secondary | ICD-10-CM | POA: Diagnosis not present

## 2019-05-03 DIAGNOSIS — M25562 Pain in left knee: Secondary | ICD-10-CM | POA: Diagnosis not present

## 2019-05-03 DIAGNOSIS — M25561 Pain in right knee: Secondary | ICD-10-CM | POA: Diagnosis not present

## 2019-05-03 DIAGNOSIS — Z79899 Other long term (current) drug therapy: Secondary | ICD-10-CM | POA: Diagnosis not present

## 2019-05-03 DIAGNOSIS — M4716 Other spondylosis with myelopathy, lumbar region: Secondary | ICD-10-CM | POA: Diagnosis not present

## 2019-05-03 DIAGNOSIS — G894 Chronic pain syndrome: Secondary | ICD-10-CM | POA: Diagnosis not present

## 2019-05-03 DIAGNOSIS — M5116 Intervertebral disc disorders with radiculopathy, lumbar region: Secondary | ICD-10-CM | POA: Diagnosis not present

## 2019-06-06 DIAGNOSIS — H401131 Primary open-angle glaucoma, bilateral, mild stage: Secondary | ICD-10-CM | POA: Diagnosis not present

## 2019-06-06 DIAGNOSIS — E119 Type 2 diabetes mellitus without complications: Secondary | ICD-10-CM | POA: Diagnosis not present

## 2019-06-15 DIAGNOSIS — E1165 Type 2 diabetes mellitus with hyperglycemia: Secondary | ICD-10-CM | POA: Diagnosis not present

## 2019-06-15 DIAGNOSIS — F17200 Nicotine dependence, unspecified, uncomplicated: Secondary | ICD-10-CM | POA: Diagnosis not present

## 2019-06-15 DIAGNOSIS — E1159 Type 2 diabetes mellitus with other circulatory complications: Secondary | ICD-10-CM | POA: Diagnosis not present

## 2019-06-15 DIAGNOSIS — I10 Essential (primary) hypertension: Secondary | ICD-10-CM | POA: Diagnosis not present

## 2019-06-28 DIAGNOSIS — M25562 Pain in left knee: Secondary | ICD-10-CM | POA: Diagnosis not present

## 2019-06-28 DIAGNOSIS — Z79899 Other long term (current) drug therapy: Secondary | ICD-10-CM | POA: Diagnosis not present

## 2019-06-28 DIAGNOSIS — Z79891 Long term (current) use of opiate analgesic: Secondary | ICD-10-CM | POA: Diagnosis not present

## 2019-06-28 DIAGNOSIS — M5116 Intervertebral disc disorders with radiculopathy, lumbar region: Secondary | ICD-10-CM | POA: Diagnosis not present

## 2019-06-28 DIAGNOSIS — M25561 Pain in right knee: Secondary | ICD-10-CM | POA: Diagnosis not present

## 2019-06-28 DIAGNOSIS — G894 Chronic pain syndrome: Secondary | ICD-10-CM | POA: Diagnosis not present

## 2019-06-28 DIAGNOSIS — M4716 Other spondylosis with myelopathy, lumbar region: Secondary | ICD-10-CM | POA: Diagnosis not present

## 2019-07-12 DIAGNOSIS — M545 Low back pain: Secondary | ICD-10-CM | POA: Diagnosis not present

## 2019-08-01 ENCOUNTER — Ambulatory Visit: Payer: PPO

## 2019-08-03 ENCOUNTER — Ambulatory Visit (INDEPENDENT_AMBULATORY_CARE_PROVIDER_SITE_OTHER): Payer: PPO

## 2019-08-03 VITALS — BP 140/82 | HR 78 | Temp 97.0°F | Ht 61.0 in | Wt 210.0 lb

## 2019-08-03 DIAGNOSIS — Z0000 Encounter for general adult medical examination without abnormal findings: Secondary | ICD-10-CM

## 2019-08-03 DIAGNOSIS — Z1231 Encounter for screening mammogram for malignant neoplasm of breast: Secondary | ICD-10-CM | POA: Diagnosis not present

## 2019-08-03 NOTE — Patient Instructions (Signed)
Erica Ruiz , Thank you for taking time to come for your Medicare Wellness Visit. I appreciate your ongoing commitment to your health goals. Please review the following plan we discussed and let me know if I can assist you in the future.   Screening recommendations/referrals: Colonoscopy: done 08/23/18. Repeat in 2024. Mammogram: done 09/01/18. Please call [phone] to schedule your mammogram.  Bone Density: due age 58 Recommended yearly ophthalmology/optometry visit for glaucoma screening and checkup Recommended yearly dental visit for hygiene and checkup  Vaccinations: Influenza vaccine: done 10/14/18 Pneumococcal vaccine: due age 70 Tdap vaccine: done 08/04/18 Shingles vaccine: Shingrix discussed. Please contact your pharmacy for coverage information.   Advanced directives: Advance directive discussed with you today. I have provided a copy for you to complete at home and have notarized. Once this is complete please bring a copy in to our office so we can scan it into your chart.  Conditions/risks identified: Keep up the great work!  Next appointment: Please follow up in one year for your Medicare Annual Wellness visit.    Preventive Care 40-64 Years, Female Preventive care refers to lifestyle choices and visits with your health care provider that can promote health and wellness. What does preventive care include?  A yearly physical exam. This is also called an annual well check.  Dental exams once or twice a year.  Routine eye exams. Ask your health care provider how often you should have your eyes checked.  Personal lifestyle choices, including:  Daily care of your teeth and gums.  Regular physical activity.  Eating a healthy diet.  Avoiding tobacco and drug use.  Limiting alcohol use.  Practicing safe sex.  Taking low-dose aspirin daily starting at age 48.  Taking vitamin and mineral supplements as recommended by your health care provider. What happens during an  annual well check? The services and screenings done by your health care provider during your annual well check will depend on your age, overall health, lifestyle risk factors, and family history of disease. Counseling  Your health care provider may ask you questions about your:  Alcohol use.  Tobacco use.  Drug use.  Emotional well-being.  Home and relationship well-being.  Sexual activity.  Eating habits.  Work and work Statistician.  Method of birth control.  Menstrual cycle.  Pregnancy history. Screening  You may have the following tests or measurements:  Height, weight, and BMI.  Blood pressure.  Lipid and cholesterol levels. These may be checked every 5 years, or more frequently if you are over 8 years old.  Skin check.  Lung cancer screening. You may have this screening every year starting at age 59 if you have a 30-pack-year history of smoking and currently smoke or have quit within the past 15 years.  Fecal occult blood test (FOBT) of the stool. You may have this test every year starting at age 43.  Flexible sigmoidoscopy or colonoscopy. You may have a sigmoidoscopy every 5 years or a colonoscopy every 10 years starting at age 43.  Hepatitis C blood test.  Hepatitis B blood test.  Sexually transmitted disease (STD) testing.  Diabetes screening. This is done by checking your blood sugar (glucose) after you have not eaten for a while (fasting). You may have this done every 1-3 years.  Mammogram. This may be done every 1-2 years. Talk to your health care provider about when you should start having regular mammograms. This may depend on whether you have a family history of breast cancer.  BRCA-related cancer  screening. This may be done if you have a family history of breast, ovarian, tubal, or peritoneal cancers.  Pelvic exam and Pap test. This may be done every 3 years starting at age 84. Starting at age 61, this may be done every 5 years if you have a Pap  test in combination with an HPV test.  Bone density scan. This is done to screen for osteoporosis. You may have this scan if you are at high risk for osteoporosis. Discuss your test results, treatment options, and if necessary, the need for more tests with your health care provider. Vaccines  Your health care provider may recommend certain vaccines, such as:  Influenza vaccine. This is recommended every year.  Tetanus, diphtheria, and acellular pertussis (Tdap, Td) vaccine. You may need a Td booster every 10 years.  Zoster vaccine. You may need this after age 23.  Pneumococcal 13-valent conjugate (PCV13) vaccine. You may need this if you have certain conditions and were not previously vaccinated.  Pneumococcal polysaccharide (PPSV23) vaccine. You may need one or two doses if you smoke cigarettes or if you have certain conditions. Talk to your health care provider about which screenings and vaccines you need and how often you need them. This information is not intended to replace advice given to you by your health care provider. Make sure you discuss any questions you have with your health care provider. Document Released: 12/28/2015 Document Revised: 08/20/2016 Document Reviewed: 10/02/2015 Elsevier Interactive Patient Education  2017 [place] Prevention in the Home Falls can cause injuries. They can happen to people of all ages. There are many things you can do to make your home safe and to help prevent falls. What can I do on the outside of my home?  Regularly fix the edges of walkways and driveways and fix any cracks.  Remove anything that might make you trip as you walk through a door, such as a raised step or threshold.  Trim any bushes or trees on the path to your home.  Use bright outdoor lighting.  Clear any walking paths of anything that might make someone trip, such as rocks or tools.  Regularly check to see if handrails are loose or broken. Make sure that  both sides of any steps have handrails.  Any raised decks and porches should have guardrails on the edges.  Have any leaves, snow, or ice cleared regularly.  Use sand or salt on walking paths during winter.  Clean up any spills in your garage right away. This includes oil or grease spills. What can I do in the bathroom?  Use night lights.  Install grab bars by the toilet and in the tub and shower. Do not use towel bars as grab bars.  Use non-skid mats or decals in the tub or shower.  If you need to sit down in the shower, use a plastic, non-slip stool.  Keep the floor dry. Clean up any water that spills on the floor as soon as it happens.  Remove soap buildup in the tub or shower regularly.  Attach bath mats securely with double-sided non-slip rug tape.  Do not have throw rugs and other things on the floor that can make you trip. What can I do in the bedroom?  Use night lights.  Make sure that you have a light by your bed that is easy to reach.  Do not use any sheets or blankets that are too big for your bed. They should not  hang down onto the floor.  Have a firm chair that has side arms. You can use this for support while you get dressed.  Do not have throw rugs and other things on the floor that can make you trip. What can I do in the kitchen?  Clean up any spills right away.  Avoid walking on wet floors.  Keep items that you use a lot in easy-to-reach places.  If you need to reach something above you, use a strong step stool that has a grab bar.  Keep electrical cords out of the way.  Do not use floor polish or wax that makes floors slippery. If you must use wax, use non-skid floor wax.  Do not have throw rugs and other things on the floor that can make you trip. What can I do with my stairs?  Do not leave any items on the stairs.  Make sure that there are handrails on both sides of the stairs and use them. Fix handrails that are broken or loose. Make sure  that handrails are as long as the stairways.  Check any carpeting to make sure that it is firmly attached to the stairs. Fix any carpet that is loose or worn.  Avoid having throw rugs at the top or bottom of the stairs. If you do have throw rugs, attach them to the floor with carpet tape.  Make sure that you have a light switch at the top of the stairs and the bottom of the stairs. If you do not have them, ask someone to add them for you. What else can I do to help prevent falls?  Wear shoes that:  Do not have high heels.  Have rubber bottoms.  Are comfortable and fit you well.  Are closed at the toe. Do not wear sandals.  If you use a stepladder:  Make sure that it is fully opened. Do not climb a closed stepladder.  Make sure that both sides of the stepladder are locked into place.  Ask someone to hold it for you, if possible.  Clearly mark and make sure that you can see:  Any grab bars or handrails.  First and last steps.  Where the edge of each step is.  Use tools that help you move around (mobility aids) if they are needed. These include:  Canes.  Walkers.  Scooters.  Crutches.  Turn on the lights when you go into a dark area. Replace any light bulbs as soon as they burn out.  Set up your furniture so you have a clear path. Avoid moving your furniture around.  If any of your floors are uneven, fix them.  If there are any pets around you, be aware of where they are.  Review your medicines with your doctor. Some medicines can make you feel dizzy. This can increase your chance of falling. Ask your doctor what other things that you can do to help prevent falls. This information is not intended to replace advice given to you by your health care provider. Make sure you discuss any questions you have with your health care provider. Document Released: 09/27/2009 Document Revised: 05/08/2016 Document Reviewed: 01/05/2015 Elsevier Interactive Patient Education   2017 [employer].

## 2019-08-03 NOTE — Progress Notes (Signed)
Subjective:   Erica Ruiz is a 58 y.o. female who presents for Medicare Annual (Subsequent) preventive examination.  Virtual Visit via Telephone Note  I connected with Erica Ruiz on 08/03/19 at  3:20 PM EDT by telephone and verified that I am speaking with the correct person using two identifiers.  Medicare Annual Wellness visit completed telephonically due to Covid-19 pandemic.   Location: Patient: home Provider: office   I discussed the limitations, risks, security and privacy concerns of performing an evaluation and management service by telephone and the availability of in person appointments. The patient expressed understanding and agreed to proceed.  Some vital signs may be absent or patient reported.   Clemetine Marker, LPN    Review of Systems:   Cardiac Risk Factors include: diabetes mellitus;hypertension;obesity (BMI >30kg/m2);smoking/ tobacco exposure     Objective:     Vitals: BP 140/82   Pulse 78   Temp (!) 97 F (36.1 C)   Ht _0  (1.549 m)   Wt 210 lb (95.3 kg)   BMI 39.68 kg/m   Body mass index is 39.68 kg/m.  Advanced Directives 08/03/2019 08/23/2018 07/28/2018 06/28/2018  Does Patient Have a Medical Advance Directive? No No No No  Would patient like information on creating a medical advance directive? Yes (MAU/Ambulatory/Procedural Areas - Information given) No - Patient declined Yes (MAU/Ambulatory/Procedural Areas - Information given) No - Patient declined    Tobacco Social History   Tobacco Use  Smoking Status Current Every Day Smoker  . Packs/day: 0.50  . Years: 25.00  . Pack years: 12.50  . Types: Cigarettes  Smokeless Tobacco Never Used     Ready to quit: Yes Counseling given: Yes   Clinical Intake:  Pre-visit preparation completed: Yes  Pain : No/denies pain     BMI - recorded: 39.68 Nutritional Status: BMI > 30  Obese Nutritional Risks: None Diabetes: Yes CBG done?: No Did pt. bring in CBG monitor from home?:  No   Nutrition Risk Assessment:  Has the patient had any N/V/D within the last 2 months?  No  Does the patient have any non-healing wounds?  No  Has the patient had any unintentional weight loss or weight gain?  No   Diabetes:  Is the patient diabetic?  Yes  If diabetic, was a CBG obtained today?  No  Did the patient bring in their glucometer from home?  No  How often do you monitor your CBG's? Twice daily, fasting am today = 120. Pt also checks 2 hours after supper    Financial Strains and Diabetes Management:  Are you having any financial strains with the device, your supplies or your medication? No .  Does the patient want to be seen by Chronic Care Management for management of their diabetes?  No  Would the patient like to be referred to a Nutritionist or for Diabetic Management?  No   Diabetic Exams:  Diabetic Eye Exam: Completed 06/09/18. Overdue for diabetic eye exam. Appt r/s due to Covid-19  Diabetic Foot Exam: Completed 08/04/18. Pt has been advised about the importance in completing this exam.   How often do you need to have someone help you when you read instructions, pamphlets, or other written materials from your doctor or pharmacy?: 1 - Never  Interpreter Needed?: No  Information entered by :: Clemetine Marker LPN  Past Medical History:  Diagnosis Date  . Back pain   . Diabetes mellitus without complication ([place])   . Hypertension  Past Surgical History:  Procedure Laterality Date  . CHOLECYSTECTOMY    . COLON SURGERY    . COLONOSCOPY WITH PROPOFOL N/A 08/23/2018   Procedure: COLONOSCOPY WITH PROPOFOL with biopsies;  Surgeon: Lucilla Lame, MD;  Location: [facility];  Service: Endoscopy;  Laterality: N/A;  DIABETIC  . COLOSTOMY REVERSAL    . KNEE SURGERY    . POLYPECTOMY N/A 08/23/2018   Procedure: POLYPECTOMY;  Surgeon: Lucilla Lame, MD;  Location: [facility];  Service: Endoscopy;  Laterality: N/A;  . TONSILLECTOMY    . TUBAL LIGATION      Family History  Problem Relation Age of Onset  . Pulmonary embolism Mother   . Cancer Father        prostate  . Heart disease Father   . Hypertension Father   . Cancer Sister        non-hodgkin's lymphoma  . Hepatitis C Sister   . Cancer Brother        colon  . Diabetes Brother   . Heart disease Brother   . Hypertension Brother   . Diabetes Daughter   . Diabetes Brother   . Hypertension Sister   . Cancer Brother        prostate   Social History   Socioeconomic History  . Marital status: Married    Spouse name: Not on file  . Number of children: 4  . Years of education: Not on file  . Highest education level: 12th grade  Occupational History  . Occupation: Disabled  Social Needs  . Financial resource strain: Not hard at all  . Food insecurity    Worry: Never true    Inability: Never true  . Transportation needs    Medical: No    Non-medical: No  Tobacco Use  . Smoking status: Current Every Day Smoker    Packs/day: 0.50    Years: 25.00    Pack years: 12.50    Types: Cigarettes  . Smokeless tobacco: Never Used  Substance and Sexual Activity  . Alcohol use: Never    Frequency: Never  . Drug use: Never  . Sexual activity: Not Currently    Birth control/protection: Spermicide  Lifestyle  . Physical activity    Days per week: 7 days    Minutes per session: 20 min  . Stress: Not at all  Relationships  . Social Herbalist on phone: Patient refused    Gets together: Patient refused    Attends religious service: Patient refused    Active member of club or organization: Patient refused    Attends meetings of clubs or organizations: Patient refused    Relationship status: Married  Other Topics Concern  . Not on file  Social History Narrative  . Not on file    Outpatient Encounter Medications as of 08/03/2019  Medication Sig  . DULoxetine (CYMBALTA) 30 MG capsule TK ONE C PO ONCE A DAY FOR 90 DAYS- pain management  . empagliflozin (JARDIANCE) 25  MG TABS tablet Take 25 mg by mouth daily. Hilary  . gabapentin (NEURONTIN) 600 MG tablet TK 1 T PO BID- pain management  . glucose blood test strip 1 each by Other route 2 (two) times daily.  . hydrochlorothiazide (HYDRODIURIL) 25 MG tablet TAKE 1 TABLET(25 MG) BY MOUTH DAILY  . Lancets (ONETOUCH ULTRASOFT) lancets 1 each by Other route 2 (two) times daily.  Marland Kitchen lisinopril (PRINIVIL,ZESTRIL) 10 MG tablet Take 1 tablet (10 mg total) by mouth daily.  Marland Kitchen  metFORMIN (GLUCOPHAGE-XR) 500 MG 24 hr tablet Take 1,000 mg by mouth 2 (two) times daily. Hilary  . morphine (MS CONTIN) 15 MG 12 hr tablet TK 1 T PO  Q 8 H FOR 30 DAYS- pain management  . naproxen sodium (ALEVE) 220 MG tablet Take 220 mg by mouth every 12 (twelve) hours as needed.  Marland Kitchen NARCAN 4 MG/0.1ML LIQD nasal spray kit NAR REP ALN  . oxyCODONE-acetaminophen (PERCOCET) 7.5-325 MG tablet TK 1 T PO Q 8 H PRN- pain management  . SF 1.1 % GEL dental gel BRUSH TEETH WITH PASTE FOR AT LEAST 1 MIN BID TO TID  . tiZANidine (ZANAFLEX) 4 MG tablet TK 1 T PO Q 8 H PRF SPASMS- pain management  . [DISCONTINUED] PREVIDENT 5000 ENAMEL PROTECT 1.1-5 % PSTE   . [DISCONTINUED] ranitidine (ZANTAC) 150 MG tablet Take 150 mg by mouth 2 (two) times daily. otc   No facility-administered encounter medications on file as of 08/03/2019.     Activities of Daily Living In your present state of health, do you have any difficulty performing the following activities: 08/03/2019 08/23/2018  Hearing? N N  Comment declines hearing aids -  Vision? N N  Difficulty concentrating or making decisions? N N  Walking or climbing stairs? N N  Dressing or bathing? N N  Doing errands, shopping? N -  Preparing Food and eating ? N -  Using the Toilet? N -  In the past six months, have you accidently leaked urine? Y -  Comment wears pads for protection -  Do you have problems with loss of bowel control? N -  Managing your Medications? N -  Managing your Finances? N -  Housekeeping or  managing your Housekeeping? N -  Some recent data might be hidden    Patient Care Team: Juline Patch, MD as PCP - General (Family Medicine) Warnell Forester, NP as Nurse Practitioner (Surgery)    Assessment:   This is a routine wellness examination for Erica Ruiz.  Exercise Activities and Dietary recommendations Current Exercise Habits: Home exercise routine, Type of exercise: walking, Time (Minutes): 20, Frequency (Times/Week): 7, Weekly Exercise (Minutes/Week): 140, Intensity: Mild, Exercise limited by: orthopedic condition(s)  Goals    . DIET - INCREASE WATER INTAKE     Recommend to drink at least 6-8 8oz glasses of water per day.    . Quit Smoking     Recommend to enroll in smoking cessation classes or discuss possibility of smoking cessation alternatives with provider       Fall Risk Fall Risk  08/03/2019 10/14/2018 07/28/2018 06/28/2018  Falls in the past year? 1 No No No  Number falls in past yr: 1 - - -  Injury with Fall? 0 - - -  Risk for fall due to : History of fall(s);Impaired balance/gait - Impaired vision;Medication side effect;Impaired balance/gait;Other (Comment) Impaired balance/gait  Risk for fall due to: Comment - - wears eyeglasses; ambulates with cane; R foot drop -  Follow up Falls prevention discussed - - -   FALL RISK PREVENTION PERTAINING TO THE HOME:  Any stairs in or around the home? Yes  If so, do they handrails? Yes   Home free of loose throw rugs in walkways, pet beds, electrical cords, etc? Yes  Adequate lighting in your home to reduce risk of falls? Yes   ASSISTIVE DEVICES UTILIZED TO PREVENT FALLS:  Life alert? No  Use of a cane, walker or w/c? Yes  Grab bars in the bathroom? Yes  Shower chair or bench in shower? Yes  Elevated toilet seat or a handicapped toilet? Yes   DME ORDERS:  DME order needed?  No   TIMED UP AND GO:  Was the test performed? No .  Telephonic visit  Education: Fall risk prevention has been discussed.   Intervention(s) required? No   Depression Screen PHQ 2/9 Scores 08/03/2019 10/14/2018 08/04/2018 07/28/2018  PHQ - 2 Score 0 0 0 0  PHQ- 9 Score - 1 0 0     Cognitive Function     6CIT Screen 08/03/2019 07/28/2018  What Year? 0 points 0 points  What month? 0 points 0 points  What time? 0 points 0 points  Count back from 20 0 points 0 points  Months in reverse 0 points 4 points  Repeat phrase 0 points 6 points  Total Score 0 10    Immunization History  Administered Date(s) Administered  . Influenza Inj Mdck Quad With Preservative 12/17/2017  . Influenza,inj,Quad PF,6+ Mos 10/14/2018  . Influenza-Unspecified 10/14/2018  . Tdap 08/04/2018    Qualifies for Shingles Vaccine? Yes . Due for Shingrix. Education has been provided regarding the importance of this vaccine. Pt has been advised to call insurance company to determine out of pocket expense. Advised may also receive vaccine at local pharmacy or Health Dept. Verbalized acceptance and understanding.  Tdap: Up to date  Flu Vaccine: Up to date  Pneumococcal Vaccine: Due for Pneumococcal vaccine at age 69.   Screening Tests Health Maintenance  Topic Date Due  . OPHTHALMOLOGY EXAM  06/10/2019  . INFLUENZA VACCINE  07/16/2019  . PNEUMOCOCCAL POLYSACCHARIDE VACCINE AGE 60-64 HIGH RISK  09/04/2028 (Originally 09/04/1963)  . FOOT EXAM  08/05/2019  . MAMMOGRAM  09/02/2019  . HEMOGLOBIN A1C  12/16/2019  . PAP SMEAR-Modifier  08/05/2021  . COLONOSCOPY  08/24/2023  . TETANUS/TDAP  08/04/2028  . Hepatitis C Screening  Completed  . HIV Screening  Discontinued    Cancer Screenings:  Colorectal Screening: Completed 08/23/18. Repeat every 5 years  Mammogram: Completed 09/01/18. Repeat every year. Ordered today. Pt provided with contact information and advised to call to schedule appt.   Bone Density: due at age 66  Lung Cancer Screening: (Low Dose CT Chest recommended if Age 61-80 years, 30 pack-year currently smoking OR have quit  w/in 15years.) does not qualify.   Additional Screening:  Hepatitis C Screening: does qualify; Completed 08/04/18  Vision Screening: Recommended annual ophthalmology exams for early detection of glaucoma and other disorders of the eye. Is the patient up to date with their annual eye exam?  Yes  Who is the provider or what is the name of the office in which the pt attends annual eye exams? Dr. Ellin Mayhew  Dental Screening: Recommended annual dental exams for proper oral hygiene  Community Resource Referral:  CRR required this visit?  No      Plan:     I have personally reviewed and addressed the Medicare Annual Wellness questionnaire and have noted the following in the patient's chart:  A. Medical and social history B. Use of alcohol, tobacco or illicit drugs  C. Current medications and supplements D. Functional ability and status E.  Nutritional status F.  Physical activity G. Advance directives H. List of other physicians I.  Hospitalizations, surgeries, and ER visits in previous 12 months J.  [place] such as hearing and vision if needed, cognitive and depression L. Referrals and appointments   In addition, I have reviewed and discussed with  patient certain preventive protocols, quality metrics, and best practice recommendations. A written personalized care plan for preventive services as well as general preventive health recommendations were provided to patient.   Signed,  Clemetine Marker, LPN Nurse Health Advisor   Nurse Notes: pt doing well and appreciative of visit today, also advised due to schedule yearly visit with Dr. Ronnald Ramp.

## 2019-08-05 ENCOUNTER — Ambulatory Visit (INDEPENDENT_AMBULATORY_CARE_PROVIDER_SITE_OTHER): Payer: PPO | Admitting: Family Medicine

## 2019-08-05 ENCOUNTER — Other Ambulatory Visit: Payer: Self-pay

## 2019-08-05 ENCOUNTER — Encounter: Payer: Self-pay | Admitting: Family Medicine

## 2019-08-05 VITALS — BP 120/70 | HR 80 | Ht 61.0 in | Wt 220.0 lb

## 2019-08-05 DIAGNOSIS — I10 Essential (primary) hypertension: Secondary | ICD-10-CM

## 2019-08-05 MED ORDER — LISINOPRIL 10 MG PO TABS: 10.0000 mg | ORAL_TABLET | Freq: Every day | ORAL | 2 refills | Status: DC

## 2019-08-05 MED ORDER — HYDROCHLOROTHIAZIDE 25 MG PO TABS: ORAL_TABLET | ORAL | 2 refills | Status: DC

## 2019-08-05 NOTE — Progress Notes (Signed)
Date:  08/05/2019   Name:  Erica Ruiz [facility]   DOB:  06-05-1961   MRN:  [id number]   Chief Complaint: Hypertension  Hypertension This is a chronic problem. The current episode started more than 1 year ago. The problem has been waxing and waning since onset. The problem is controlled. Pertinent negatives include no anxiety, blurred vision, chest pain, headaches, malaise/fatigue, neck pain, orthopnea, palpitations, peripheral edema, PND, shortness of breath or sweats. There are no associated agents to hypertension. Risk factors for coronary artery disease include diabetes mellitus and dyslipidemia. Past treatments include ACE inhibitors. There are no compliance problems.  There is no history of angina, kidney disease, CAD/MI, CVA, heart failure, left ventricular hypertrophy, PVD or retinopathy. There is no history of a hypertension causing med or renovascular disease.    Review of Systems  Constitutional: Negative.  Negative for chills, fatigue, fever, malaise/fatigue and unexpected weight change.  HENT: Negative for congestion, ear discharge, ear pain, rhinorrhea, sinus pressure, sneezing and sore throat.   Eyes: Negative for blurred vision, photophobia, pain, discharge, redness and itching.  Respiratory: Negative for cough, shortness of breath, wheezing and stridor.   Cardiovascular: Negative for chest pain, palpitations, orthopnea and PND.  Gastrointestinal: Negative for abdominal pain, blood in stool, constipation, diarrhea, nausea and vomiting.  Endocrine: Negative for cold intolerance, heat intolerance, polydipsia, polyphagia and polyuria.  Genitourinary: Negative for dysuria, flank pain, frequency, hematuria, menstrual problem, pelvic pain, urgency, vaginal bleeding and vaginal discharge.  Musculoskeletal: Negative for arthralgias, back pain, myalgias and neck pain.  Skin: Negative for rash.  Allergic/Immunologic: Negative for environmental allergies and food allergies.  Neurological:  Negative for dizziness, weakness, light-headedness, numbness and headaches.  Hematological: Negative for adenopathy. Does not bruise/bleed easily.  Psychiatric/Behavioral: Negative for dysphoric mood. The patient is not nervous/anxious.     Patient Active Problem List   Diagnosis Date Noted  . Type 2 diabetes mellitus with hyperglycemia, without long-term current use of insulin ([place]) 09/17/2018  . Tobacco dependence 09/17/2018  . Special screening for malignant neoplasms, colon   . Benign neoplasm of transverse colon   . Polyp of sigmoid colon   . Rectal polyp   . Hyperglycemia 04/16/2018  . Essential hypertension 04/16/2018  . Venous insufficiency 04/16/2018  . Abnormal weight gain 04/16/2018    No Known Allergies  Past Surgical History:  Procedure Laterality Date  . CHOLECYSTECTOMY    . COLON SURGERY    . COLONOSCOPY WITH PROPOFOL N/A 08/23/2018   Procedure: COLONOSCOPY WITH PROPOFOL with biopsies;  Surgeon: Lucilla Lame, MD;  Location: [place];  Service: Endoscopy;  Laterality: N/A;  DIABETIC  . COLOSTOMY REVERSAL    . KNEE SURGERY    . POLYPECTOMY N/A 08/23/2018   Procedure: POLYPECTOMY;  Surgeon: Lucilla Lame, MD;  Location: [place];  Service: Endoscopy;  Laterality: N/A;  . TONSILLECTOMY    . TUBAL LIGATION      Social History   Tobacco Use  . Smoking status: Current Every Day Smoker    Packs/day: 0.50    Years: 25.00    Pack years: 12.50    Types: Cigarettes  . Smokeless tobacco: Never Used  Substance Use Topics  . Alcohol use: Never    Frequency: Never  . Drug use: Never     Medication list has been reviewed and updated.  Current Meds  Medication Sig  . DULoxetine (CYMBALTA) 30 MG capsule TK ONE C PO ONCE A DAY FOR 90 DAYS- pain management  .  empagliflozin (JARDIANCE) 25 MG TABS tablet Take 25 mg by mouth daily. Hilary  . gabapentin (NEURONTIN) 600 MG tablet TK 1 T PO BID- pain management  . glucose blood test strip 1 each by Other  route 2 (two) times daily.  . hydrochlorothiazide (HYDRODIURIL) 25 MG tablet TAKE 1 TABLET(25 MG) BY MOUTH DAILY  . Lancets (ONETOUCH ULTRASOFT) lancets 1 each by Other route 2 (two) times daily.  Marland Kitchen lisinopril (PRINIVIL,ZESTRIL) 10 MG tablet Take 1 tablet (10 mg total) by mouth daily.  . metFORMIN (GLUCOPHAGE-XR) 500 MG 24 hr tablet Take 1,000 mg by mouth 2 (two) times daily. Hilary  . morphine (MS CONTIN) 15 MG 12 hr tablet TK 1 T PO  Q 8 H FOR 30 DAYS- pain management  . naproxen sodium (ALEVE) 220 MG tablet Take 220 mg by mouth every 12 (twelve) hours as needed.  Marland Kitchen NARCAN 4 MG/0.1ML LIQD nasal spray kit NAR REP ALN  . oxyCODONE-acetaminophen (PERCOCET) 7.5-325 MG tablet TK 1 T PO Q 8 H PRN- pain management  . SF 1.1 % GEL dental gel BRUSH TEETH WITH PASTE FOR AT LEAST 1 MIN BID TO TID  . tiZANidine (ZANAFLEX) 4 MG tablet TK 1 T PO Q 8 H PRF SPASMS- pain management    PHQ 2/9 Scores 08/03/2019 10/14/2018 08/04/2018 07/28/2018  PHQ - 2 Score 0 0 0 0  PHQ- 9 Score - 1 0 0    BP Readings from Last 3 Encounters:  08/05/19 120/70  08/03/19 140/82  10/14/18 120/70    Physical Exam Vitals signs and nursing note reviewed.  Constitutional:      Appearance: She is well-developed.  HENT:     Head: Normocephalic.     Right Ear: Tympanic membrane, ear canal and external ear normal.     Left Ear: Tympanic membrane, ear canal and external ear normal.     Nose: Nose normal. No congestion or rhinorrhea.     Mouth/Throat:     Mouth: Mucous membranes are moist.  Eyes:     General: Lids are everted, no foreign bodies appreciated. No scleral icterus.       Left eye: No foreign body or hordeolum.     Conjunctiva/sclera: Conjunctivae normal.     Right eye: Right conjunctiva is not injected.     Left eye: Left conjunctiva is not injected.     Pupils: Pupils are equal, round, and reactive to light.  Neck:     Musculoskeletal: Normal range of motion and neck supple. No neck rigidity or muscular  tenderness.     Thyroid: No thyromegaly.     Vascular: No carotid bruit or JVD.     Trachea: No tracheal deviation.  Cardiovascular:     Rate and Rhythm: Normal rate and regular rhythm.     Heart sounds: Normal heart sounds. No murmur. No friction rub. No gallop.   Pulmonary:     Effort: Pulmonary effort is normal. No respiratory distress.     Breath sounds: Normal breath sounds. No wheezing, rhonchi or rales.  Abdominal:     General: Bowel sounds are normal.     Palpations: Abdomen is soft. There is no mass.     Tenderness: There is no abdominal tenderness. There is no guarding or rebound.  Musculoskeletal: Normal range of motion.        General: No tenderness.  Lymphadenopathy:     Cervical: No cervical adenopathy.  Skin:    General: Skin is warm.     Findings:  No bruising, erythema or rash.  Neurological:     Mental Status: She is alert and oriented to person, place, and time.     Cranial Nerves: No cranial nerve deficit.     Deep Tendon Reflexes: Reflexes normal.  Psychiatric:        Mood and Affect: Mood is not anxious or depressed.     Wt Readings from Last 3 Encounters:  08/05/19 220 lb (99.8 kg)  08/03/19 210 lb (95.3 kg)  10/14/18 228 lb (103.4 kg)    BP 120/70   Pulse 80   Ht '5\' 1"'  (1.549 m)   Wt 220 lb (99.8 kg)   BMI 41.57 kg/m   Assessment and Plan:  1. Essential hypertension Chronic.  Controlled.  Continue lisinopril 10 mg once a day and hydrochlorothiazide 25 mg once a day.  Reviewed patient's labs from endocrinology and all areas are within normal limits.  Patient will return in 9 months for recheck. - lisinopril (ZESTRIL) 10 MG tablet; Take 1 tablet (10 mg total) by mouth daily.  Dispense: 90 tablet; Refill: 2 - hydrochlorothiazide (HYDRODIURIL) 25 MG tablet; TAKE 1 TABLET(25 MG) BY MOUTH DAILY  Dispense: 90 tablet; Refill: 2

## 2019-08-30 DIAGNOSIS — G894 Chronic pain syndrome: Secondary | ICD-10-CM | POA: Diagnosis not present

## 2019-08-30 DIAGNOSIS — Z79891 Long term (current) use of opiate analgesic: Secondary | ICD-10-CM | POA: Diagnosis not present

## 2019-08-30 DIAGNOSIS — M25561 Pain in right knee: Secondary | ICD-10-CM | POA: Diagnosis not present

## 2019-08-30 DIAGNOSIS — M4716 Other spondylosis with myelopathy, lumbar region: Secondary | ICD-10-CM | POA: Diagnosis not present

## 2019-08-30 DIAGNOSIS — Z79899 Other long term (current) drug therapy: Secondary | ICD-10-CM | POA: Diagnosis not present

## 2019-08-30 DIAGNOSIS — M5116 Intervertebral disc disorders with radiculopathy, lumbar region: Secondary | ICD-10-CM | POA: Diagnosis not present

## 2019-08-30 DIAGNOSIS — M25562 Pain in left knee: Secondary | ICD-10-CM | POA: Diagnosis not present

## 2019-10-28 IMAGING — CR DG CHEST 2V
2 series · 2 of 2 positions shown · non-contrast
Comparison: None in PACs

CLINICAL DATA: Lower extremity edema for the past 2 weeks. Current
smoker.

EXAM:
CHEST - 2 VIEW

[chest pa]
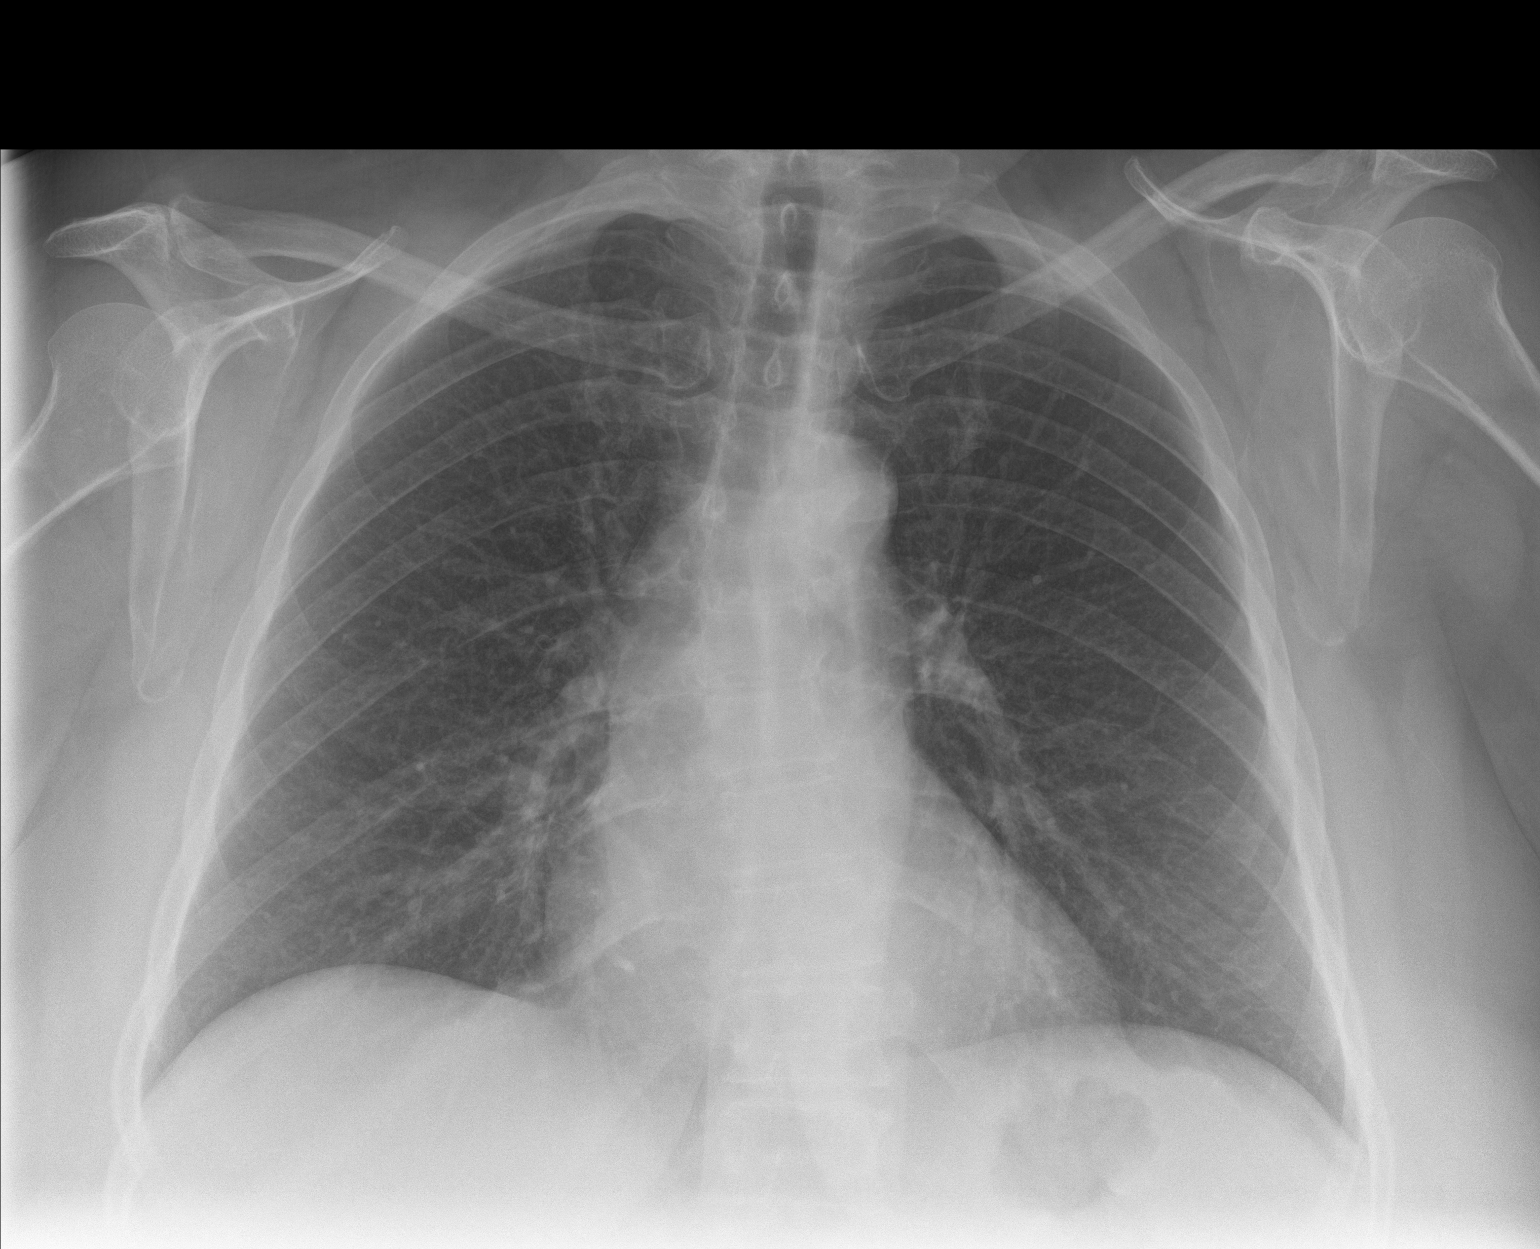

[chest lat]
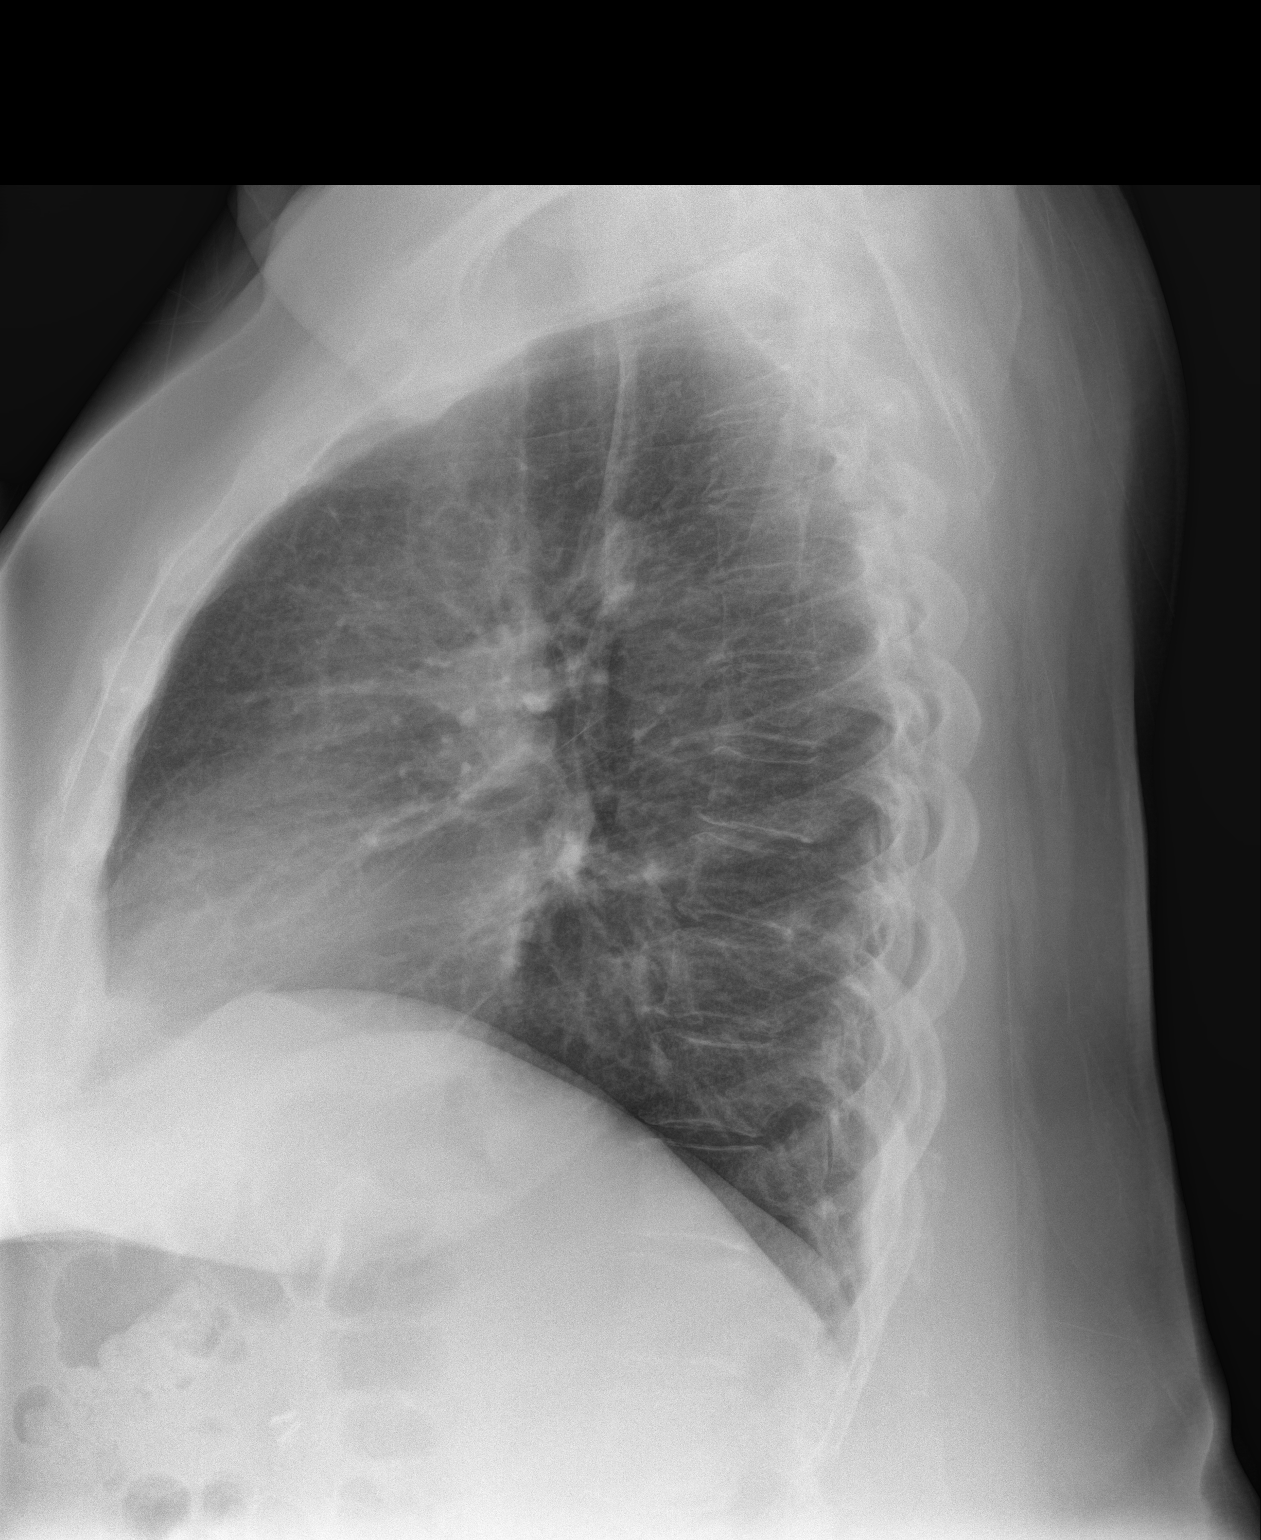

[2 of 2 positions shown; findings below may reference images not displayed]

FINDINGS: The lungs are well-expanded. The pulmonary vascularity is normal and
the interstitial markings are not abnormally increased. The heart is
normal in size. The mediastinum is normal in width. The bony thorax
is unremarkable. There is no pleural effusion.
IMPRESSION: There is no evidence of CHF or noncardiac pulmonary edema nor other
acute cardiopulmonary disease.

## 2019-11-01 DIAGNOSIS — M4716 Other spondylosis with myelopathy, lumbar region: Secondary | ICD-10-CM | POA: Diagnosis not present

## 2019-11-01 DIAGNOSIS — G894 Chronic pain syndrome: Secondary | ICD-10-CM | POA: Diagnosis not present

## 2019-11-01 DIAGNOSIS — Z79899 Other long term (current) drug therapy: Secondary | ICD-10-CM | POA: Diagnosis not present

## 2019-11-01 DIAGNOSIS — M5116 Intervertebral disc disorders with radiculopathy, lumbar region: Secondary | ICD-10-CM | POA: Diagnosis not present

## 2019-11-01 DIAGNOSIS — Z79891 Long term (current) use of opiate analgesic: Secondary | ICD-10-CM | POA: Diagnosis not present

## 2019-11-01 DIAGNOSIS — M25561 Pain in right knee: Secondary | ICD-10-CM | POA: Diagnosis not present

## 2019-11-01 DIAGNOSIS — M25562 Pain in left knee: Secondary | ICD-10-CM | POA: Diagnosis not present

## 2019-12-22 DIAGNOSIS — I10 Essential (primary) hypertension: Secondary | ICD-10-CM | POA: Diagnosis not present

## 2019-12-22 DIAGNOSIS — E1159 Type 2 diabetes mellitus with other circulatory complications: Secondary | ICD-10-CM | POA: Diagnosis not present

## 2019-12-22 DIAGNOSIS — F17200 Nicotine dependence, unspecified, uncomplicated: Secondary | ICD-10-CM | POA: Diagnosis not present

## 2019-12-22 DIAGNOSIS — E1165 Type 2 diabetes mellitus with hyperglycemia: Secondary | ICD-10-CM | POA: Diagnosis not present

## 2019-12-22 LAB — HEMOGLOBIN A1C: Hemoglobin A1C: 6.4

## 2020-01-10 DIAGNOSIS — M5116 Intervertebral disc disorders with radiculopathy, lumbar region: Secondary | ICD-10-CM | POA: Diagnosis not present

## 2020-01-10 DIAGNOSIS — M25561 Pain in right knee: Secondary | ICD-10-CM | POA: Diagnosis not present

## 2020-01-10 DIAGNOSIS — G894 Chronic pain syndrome: Secondary | ICD-10-CM | POA: Diagnosis not present

## 2020-01-10 DIAGNOSIS — M4716 Other spondylosis with myelopathy, lumbar region: Secondary | ICD-10-CM | POA: Diagnosis not present

## 2020-01-10 DIAGNOSIS — Z79899 Other long term (current) drug therapy: Secondary | ICD-10-CM | POA: Diagnosis not present

## 2020-01-10 DIAGNOSIS — Z79891 Long term (current) use of opiate analgesic: Secondary | ICD-10-CM | POA: Diagnosis not present

## 2020-01-10 DIAGNOSIS — M25562 Pain in left knee: Secondary | ICD-10-CM | POA: Diagnosis not present

## 2020-02-14 DIAGNOSIS — M545 Low back pain: Secondary | ICD-10-CM | POA: Diagnosis not present

## 2020-03-05 ENCOUNTER — Other Ambulatory Visit: Payer: Self-pay

## 2020-03-05 NOTE — Progress Notes (Unsigned)
Put in A1C

## 2020-03-06 DIAGNOSIS — Z79899 Other long term (current) drug therapy: Secondary | ICD-10-CM | POA: Diagnosis not present

## 2020-03-06 DIAGNOSIS — M25562 Pain in left knee: Secondary | ICD-10-CM | POA: Diagnosis not present

## 2020-03-06 DIAGNOSIS — Z79891 Long term (current) use of opiate analgesic: Secondary | ICD-10-CM | POA: Diagnosis not present

## 2020-03-06 DIAGNOSIS — M25561 Pain in right knee: Secondary | ICD-10-CM | POA: Diagnosis not present

## 2020-03-06 DIAGNOSIS — G894 Chronic pain syndrome: Secondary | ICD-10-CM | POA: Diagnosis not present

## 2020-03-06 DIAGNOSIS — M4716 Other spondylosis with myelopathy, lumbar region: Secondary | ICD-10-CM | POA: Diagnosis not present

## 2020-03-06 DIAGNOSIS — M5116 Intervertebral disc disorders with radiculopathy, lumbar region: Secondary | ICD-10-CM | POA: Diagnosis not present

## 2020-05-01 DIAGNOSIS — Z79899 Other long term (current) drug therapy: Secondary | ICD-10-CM | POA: Diagnosis not present

## 2020-05-01 DIAGNOSIS — M4716 Other spondylosis with myelopathy, lumbar region: Secondary | ICD-10-CM | POA: Diagnosis not present

## 2020-05-01 DIAGNOSIS — M5116 Intervertebral disc disorders with radiculopathy, lumbar region: Secondary | ICD-10-CM | POA: Diagnosis not present

## 2020-05-01 DIAGNOSIS — M25561 Pain in right knee: Secondary | ICD-10-CM | POA: Diagnosis not present

## 2020-05-01 DIAGNOSIS — Z79891 Long term (current) use of opiate analgesic: Secondary | ICD-10-CM | POA: Diagnosis not present

## 2020-05-01 DIAGNOSIS — G894 Chronic pain syndrome: Secondary | ICD-10-CM | POA: Diagnosis not present

## 2020-05-01 DIAGNOSIS — M25562 Pain in left knee: Secondary | ICD-10-CM | POA: Diagnosis not present

## 2020-05-04 ENCOUNTER — Ambulatory Visit (INDEPENDENT_AMBULATORY_CARE_PROVIDER_SITE_OTHER): Payer: PPO | Admitting: Family Medicine

## 2020-05-04 ENCOUNTER — Encounter: Payer: Self-pay | Admitting: Family Medicine

## 2020-05-04 ENCOUNTER — Other Ambulatory Visit: Payer: Self-pay

## 2020-05-04 VITALS — BP 124/62 | HR 88 | Ht 61.0 in | Wt 228.0 lb

## 2020-05-04 DIAGNOSIS — I10 Essential (primary) hypertension: Secondary | ICD-10-CM

## 2020-05-04 MED ORDER — HYDROCHLOROTHIAZIDE 25 MG PO TABS: ORAL_TABLET | ORAL | 1 refills | Status: DC

## 2020-05-04 MED ORDER — LISINOPRIL 10 MG PO TABS: 10.0000 mg | ORAL_TABLET | Freq: Every day | ORAL | 1 refills | Status: DC

## 2020-05-04 NOTE — Progress Notes (Signed)
Date:  05/04/2020   Name:  Erica Ruiz [facility]   DOB:  [birth year]   MRN:  [id number]   Chief Complaint: Hypertension  Hypertension This is a chronic problem. The current episode started more than 1 year ago. The problem has been gradually improving since onset. The problem is controlled. Pertinent negatives include no anxiety, blurred vision, chest pain, headaches, malaise/fatigue, neck pain, orthopnea, palpitations, peripheral edema, PND, shortness of breath or sweats. There are no associated agents to hypertension. There are no known risk factors for coronary artery disease. Past treatments include ACE inhibitors and diuretics. The current treatment provides moderate improvement. There is no history of angina, kidney disease, CAD/MI, CVA, heart failure, left ventricular hypertrophy, PVD or retinopathy. There is no history of chronic renal disease, a hypertension causing med or renovascular disease.    Lab Results  Component Value Date   CREATININE 0.73 10/14/2018   BUN 11 10/14/2018   NA 140 10/14/2018   K 4.3 10/14/2018   CL 101 10/14/2018   CO2 23 10/14/2018   No results found for: CHOL, HDL, LDLCALC, LDLDIRECT, TRIG, CHOLHDL Lab Results  Component Value Date   TSH 0.852 04/16/2018   Lab Results  Component Value Date   HGBA1C 6.4 12/22/2019   Lab Results  Component Value Date   WBC 7.4 04/05/2018   HGB 14.3 04/05/2018   HCT 41.6 04/05/2018   MCV 87.3 04/05/2018   PLT 178 04/05/2018   No results found for: ALT, AST, GGT, ALKPHOS, BILITOT   Review of Systems  Constitutional: Negative.  Negative for chills, fatigue, fever, malaise/fatigue and unexpected weight change.  HENT: Negative for congestion, ear discharge, ear pain, rhinorrhea, sinus pressure, sneezing and sore throat.   Eyes: Negative for blurred vision, photophobia, pain, discharge, redness and itching.  Respiratory: Negative for cough, shortness of breath, wheezing and stridor.   Cardiovascular: Negative for  chest pain, palpitations, orthopnea and PND.  Gastrointestinal: Negative for abdominal pain, blood in stool, constipation, diarrhea, nausea and vomiting.  Endocrine: Negative for cold intolerance, heat intolerance, polydipsia, polyphagia and polyuria.  Genitourinary: Negative for dysuria, flank pain, frequency, hematuria, menstrual problem, pelvic pain, urgency, vaginal bleeding and vaginal discharge.  Musculoskeletal: Negative for arthralgias, back pain, myalgias and neck pain.  Skin: Negative for rash.  Allergic/Immunologic: Negative for environmental allergies and food allergies.  Neurological: Negative for dizziness, weakness, light-headedness, numbness and headaches.  Hematological: Negative for adenopathy. Does not bruise/bleed easily.  Psychiatric/Behavioral: Negative for dysphoric mood. The patient is not nervous/anxious.     Patient Active Problem List   Diagnosis Date Noted  . Type 2 diabetes mellitus with hyperglycemia, without long-term current use of insulin ([place]) 09/17/2018  . Tobacco dependence 09/17/2018  . Special screening for malignant neoplasms, colon   . Benign neoplasm of transverse colon   . Polyp of sigmoid colon   . Rectal polyp   . Hyperglycemia 04/16/2018  . Essential hypertension 04/16/2018  . Venous insufficiency 04/16/2018  . Abnormal weight gain 04/16/2018    No Known Allergies  Past Surgical History:  Procedure Laterality Date  . CHOLECYSTECTOMY    . COLON SURGERY    . COLONOSCOPY WITH PROPOFOL N/A 08/23/2018   Procedure: COLONOSCOPY WITH PROPOFOL with biopsies;  Surgeon: Lucilla Lame, MD;  Location: [place];  Service: Endoscopy;  Laterality: N/A;  DIABETIC  . COLOSTOMY REVERSAL    . KNEE SURGERY    . POLYPECTOMY N/A 08/23/2018   Procedure: POLYPECTOMY;  Surgeon: Lucilla Lame, MD;  Location:  [place];  Service: Endoscopy;  Laterality: N/A;  . TONSILLECTOMY    . TUBAL LIGATION      Social History   Tobacco Use  . Smoking  status: Current Every Day Smoker    Packs/day: 0.50    Years: 25.00    Pack years: 12.50    Types: Cigarettes  . Smokeless tobacco: Never Used  Substance Use Topics  . Alcohol use: Never  . Drug use: Never     Medication list has been reviewed and updated.  Current Meds  Medication Sig  . DULoxetine (CYMBALTA) 30 MG capsule TK ONE C PO ONCE A DAY FOR 90 DAYS- pain management  . empagliflozin (JARDIANCE) 25 MG TABS tablet Take 25 mg by mouth daily. Hilary  . gabapentin (NEURONTIN) 600 MG tablet TK 1 T PO BID- pain management  . glucose blood test strip 1 each by Other route 2 (two) times daily.  . hydrochlorothiazide (HYDRODIURIL) 25 MG tablet TAKE 1 TABLET(25 MG) BY MOUTH DAILY  . Lancets (ONETOUCH ULTRASOFT) lancets 1 each by Other route 2 (two) times daily.  Marland Kitchen lisinopril (ZESTRIL) 10 MG tablet Take 1 tablet (10 mg total) by mouth daily.  . metFORMIN (GLUCOPHAGE-XR) 500 MG 24 hr tablet Take 1,000 mg by mouth 2 (two) times daily. Hilary  . morphine (MS CONTIN) 15 MG 12 hr tablet TK 1 T PO  Q 8 H FOR 30 DAYS- pain management  . naproxen sodium (ALEVE) 220 MG tablet Take 220 mg by mouth every 12 (twelve) hours as needed.  Marland Kitchen NARCAN 4 MG/0.1ML LIQD nasal spray kit NAR REP ALN  . oxyCODONE-acetaminophen (PERCOCET) 7.5-325 MG tablet TK 1 T PO Q 8 H PRN- pain management  . SF 1.1 % GEL dental gel BRUSH TEETH WITH PASTE FOR AT LEAST 1 MIN BID TO TID  . tiZANidine (ZANAFLEX) 4 MG tablet TK 1 T PO Q 8 H PRF SPASMS- pain management    PHQ 2/9 Scores 05/04/2020 08/03/2019 10/14/2018 08/04/2018  PHQ - 2 Score 0 0 0 0  PHQ- 9 Score 0 - 1 0    BP Readings from Last 3 Encounters:  05/04/20 124/62  08/05/19 120/70  08/03/19 140/82    Physical Exam Vitals and nursing note reviewed.  Constitutional:      Appearance: She is well-developed.  HENT:     Head: Normocephalic.     Right Ear: Tympanic membrane, ear canal and external ear normal.     Left Ear: Tympanic membrane, ear canal and  external ear normal.  Eyes:     General: Lids are everted, no foreign bodies appreciated. No scleral icterus.       Left eye: No foreign body or hordeolum.     Conjunctiva/sclera: Conjunctivae normal.     Right eye: Right conjunctiva is not injected.     Left eye: Left conjunctiva is not injected.     Pupils: Pupils are equal, round, and reactive to light.  Neck:     Thyroid: No thyromegaly.     Vascular: No JVD.     Trachea: No tracheal deviation.  Cardiovascular:     Rate and Rhythm: Normal rate and regular rhythm.     Heart sounds: Normal heart sounds. No murmur. No friction rub. No gallop.   Pulmonary:     Effort: Pulmonary effort is normal. No respiratory distress.     Breath sounds: Normal breath sounds. No wheezing, rhonchi or rales.  Abdominal:     General: Bowel sounds are  normal.     Palpations: Abdomen is soft. There is no mass.     Tenderness: There is no abdominal tenderness. There is no guarding or rebound.  Musculoskeletal:        General: No tenderness. Normal range of motion.     Cervical back: Normal range of motion and neck supple.  Lymphadenopathy:     Cervical: No cervical adenopathy.  Skin:    General: Skin is warm.     Findings: No rash.  Neurological:     Mental Status: She is alert and oriented to person, place, and time.     Cranial Nerves: No cranial nerve deficit.     Deep Tendon Reflexes: Reflexes normal.  Psychiatric:        Mood and Affect: Mood is not anxious or depressed.     Wt Readings from Last 3 Encounters:  05/04/20 228 lb (103.4 kg)  08/05/19 220 lb (99.8 kg)  08/03/19 210 lb (95.3 kg)    BP 124/62   Pulse 88   Ht _0  (1.549 m)   Wt 228 lb (103.4 kg)   BMI 43.08 kg/m   Assessment and Plan:  1. Essential hypertension Chronic.  Controlled.  Stable.  Continue lisinopril 10 mg once a day and hydrochlorothiazide 25 mg once a day.  We will recheck patient in 6 months patient is to have labs per [facility] endocrinology  and at which time I will review her electrolytes and GFR status. - hydrochlorothiazide (HYDRODIURIL) 25 MG tablet; TAKE 1 TABLET(25 MG) BY MOUTH DAILY  Dispense: 90 tablet; Refill: 1 - lisinopril (ZESTRIL) 10 MG tablet; Take 1 tablet (10 mg total) by mouth daily.  Dispense: 90 tablet; Refill: 1

## 2020-06-12 DIAGNOSIS — H2513 Age-related nuclear cataract, bilateral: Secondary | ICD-10-CM | POA: Diagnosis not present

## 2020-06-12 DIAGNOSIS — H40013 Open angle with borderline findings, low risk, bilateral: Secondary | ICD-10-CM | POA: Diagnosis not present

## 2020-06-12 DIAGNOSIS — E119 Type 2 diabetes mellitus without complications: Secondary | ICD-10-CM | POA: Diagnosis not present

## 2020-06-17 ENCOUNTER — Telehealth: Payer: PPO | Admitting: Family

## 2020-06-17 DIAGNOSIS — J069 Acute upper respiratory infection, unspecified: Secondary | ICD-10-CM | POA: Diagnosis not present

## 2020-06-17 MED ORDER — FLUTICASONE PROPIONATE 50 MCG/ACT NA SUSP: 2.0000 | Freq: Every day | NASAL | 6 refills | Status: AC

## 2020-06-17 NOTE — Progress Notes (Signed)

## 2020-06-18 NOTE — Progress Notes (Signed)
Discussed with patient reasons antibiotics were not prescribed.

## 2020-06-21 DIAGNOSIS — I152 Hypertension secondary to endocrine disorders: Secondary | ICD-10-CM | POA: Diagnosis not present

## 2020-06-21 DIAGNOSIS — E1159 Type 2 diabetes mellitus with other circulatory complications: Secondary | ICD-10-CM | POA: Diagnosis not present

## 2020-06-21 DIAGNOSIS — E1165 Type 2 diabetes mellitus with hyperglycemia: Secondary | ICD-10-CM | POA: Diagnosis not present

## 2020-06-21 LAB — HEPATIC FUNCTION PANEL
ALT: 19 (ref 7–35)
AST: 16 (ref 13–35)

## 2020-06-21 LAB — LIPID PANEL
Cholesterol: 224 — AB (ref 0–200)
HDL: 43 (ref 35–70)
LDL Cholesterol: 145
Triglycerides: 178 — AB (ref 40–160)

## 2020-06-21 LAB — HEMOGLOBIN A1C: Hemoglobin A1C: 6.5

## 2020-06-21 LAB — BASIC METABOLIC PANEL
BUN: 14 (ref 4–21)
Creatinine: 0.7 (ref 0.5–1.1)
Glucose: 103

## 2020-06-21 LAB — MICROALBUMIN, URINE: Microalb, Ur: 36

## 2020-06-28 DIAGNOSIS — F17200 Nicotine dependence, unspecified, uncomplicated: Secondary | ICD-10-CM | POA: Diagnosis not present

## 2020-06-28 DIAGNOSIS — E1159 Type 2 diabetes mellitus with other circulatory complications: Secondary | ICD-10-CM | POA: Diagnosis not present

## 2020-06-28 DIAGNOSIS — E1165 Type 2 diabetes mellitus with hyperglycemia: Secondary | ICD-10-CM | POA: Diagnosis not present

## 2020-06-28 DIAGNOSIS — I152 Hypertension secondary to endocrine disorders: Secondary | ICD-10-CM | POA: Diagnosis not present

## 2020-07-25 DIAGNOSIS — M5116 Intervertebral disc disorders with radiculopathy, lumbar region: Secondary | ICD-10-CM | POA: Diagnosis not present

## 2020-07-25 DIAGNOSIS — Z79899 Other long term (current) drug therapy: Secondary | ICD-10-CM | POA: Diagnosis not present

## 2020-07-25 DIAGNOSIS — G894 Chronic pain syndrome: Secondary | ICD-10-CM | POA: Diagnosis not present

## 2020-07-25 DIAGNOSIS — Z79891 Long term (current) use of opiate analgesic: Secondary | ICD-10-CM | POA: Diagnosis not present

## 2020-07-25 DIAGNOSIS — M25562 Pain in left knee: Secondary | ICD-10-CM | POA: Diagnosis not present

## 2020-07-25 DIAGNOSIS — M4716 Other spondylosis with myelopathy, lumbar region: Secondary | ICD-10-CM | POA: Diagnosis not present

## 2020-07-25 DIAGNOSIS — M25561 Pain in right knee: Secondary | ICD-10-CM | POA: Diagnosis not present

## 2020-08-06 ENCOUNTER — Other Ambulatory Visit: Payer: Self-pay

## 2020-08-06 ENCOUNTER — Ambulatory Visit (INDEPENDENT_AMBULATORY_CARE_PROVIDER_SITE_OTHER): Payer: PPO

## 2020-08-06 VITALS — BP 128/72 | HR 91 | Temp 98.5°F | Resp 16 | Ht 61.0 in | Wt 216.6 lb

## 2020-08-06 DIAGNOSIS — Z1231 Encounter for screening mammogram for malignant neoplasm of breast: Secondary | ICD-10-CM | POA: Diagnosis not present

## 2020-08-06 DIAGNOSIS — Z0000 Encounter for general adult medical examination without abnormal findings: Secondary | ICD-10-CM

## 2020-08-06 NOTE — Progress Notes (Signed)
Subjective:   Erica Ruiz is a 59 y.o. female who presents for Medicare Annual (Subsequent) preventive examination.  Review of Systems     Cardiac Risk Factors include: diabetes mellitus;advanced age (>76mn, >>34women);hypertension;smoking/ tobacco exposure;sedentary lifestyle;obesity (BMI >30kg/m2)     Objective:    Today's Vitals   08/06/20 1534  BP: 128/72  Pulse: 91  Resp: 16  Temp: 98.5 F (36.9 C)  TempSrc: Oral  SpO2: 95%  Weight: 216 lb 9.6 oz (98.2 kg)  Height: '5\' 1"'  (1.549 m)  PainSc: 5    Body mass index is 40.93 kg/m.  Advanced Directives 08/06/2020 08/03/2019 08/23/2018 07/28/2018 06/28/2018  Does Patient Have a Medical Advance Directive? No No No No No  Would patient like information on creating a medical advance directive? No - Patient declined Yes (MAU/Ambulatory/Procedural Areas - Information given) No - Patient declined Yes (MAU/Ambulatory/Procedural Areas - Information given) No - Patient declined    Current Medications (verified) Outpatient Encounter Medications as of 08/06/2020  Medication Sig  . DULoxetine (CYMBALTA) 30 MG capsule TK ONE C PO ONCE A DAY FOR 90 DAYS- pain management  . empagliflozin (JARDIANCE) 25 MG TABS tablet Take 25 mg by mouth daily. Hilary  . fluticasone (FLONASE) 50 MCG/ACT nasal spray Place 2 sprays into both nostrils daily.  .Marland Kitchengabapentin (NEURONTIN) 600 MG tablet TK 1 T PO BID- pain management  . glucose blood test strip 1 each by Other route 2 (two) times daily.  . hydrochlorothiazide (HYDRODIURIL) 25 MG tablet TAKE 1 TABLET(25 MG) BY MOUTH DAILY  . Lancets (ONETOUCH ULTRASOFT) lancets 1 each by Other route 2 (two) times daily.  .Marland Kitchenlisinopril (ZESTRIL) 10 MG tablet Take 1 tablet (10 mg total) by mouth daily.  . metFORMIN (GLUCOPHAGE-XR) 500 MG 24 hr tablet Take 1,000 mg by mouth 2 (two) times daily. Hilary  . morphine (MS CONTIN) 15 MG 12 hr tablet TK 1 T PO  Q 8 H FOR 30 DAYS- pain management  . naproxen sodium (ALEVE)  220 MG tablet Take 220 mg by mouth every 12 (twelve) hours as needed.  .Marland KitchenNARCAN 4 MG/0.1ML LIQD nasal spray kit NAR REP ALN  . oxyCODONE-acetaminophen (PERCOCET) 7.5-325 MG tablet TK 1 T PO Q 8 H PRN- pain management  . tiZANidine (ZANAFLEX) 4 MG tablet TK 1 T PO Q 8 H PRF SPASMS- pain management  . [DISCONTINUED] SF 1.1 % GEL dental gel BRUSH TEETH WITH PASTE FOR AT LEAST 1 MIN BID TO TID   No facility-administered encounter medications on file as of 08/06/2020.    Allergies (verified) Patient has no known allergies.   History: Past Medical History:  Diagnosis Date  . Back pain   . Diabetes mellitus without complication ([place]   . Hypertension    Past Surgical History:  Procedure Laterality Date  . CHOLECYSTECTOMY    . COLON SURGERY    . COLONOSCOPY WITH PROPOFOL N/A 08/23/2018   Procedure: COLONOSCOPY WITH PROPOFOL with biopsies;  Surgeon: WLucilla Lame MD;  [place] [facility]  Service: Endoscopy;  Laterality: N/A;  DIABETIC  . COLOSTOMY REVERSAL    . KNEE SURGERY    . POLYPECTOMY N/A 08/23/2018   Procedure: POLYPECTOMY;  Surgeon: WLucilla Lame MD;  [place] [facility]  Service: Endoscopy;  Laterality: N/A;  . TONSILLECTOMY    . TUBAL LIGATION     Family History  Problem Relation Age of Onset  . Pulmonary embolism Mother   . Cancer Father  prostate  . Heart disease Father   . Hypertension Father   . Cancer Sister        non-hodgkin's lymphoma  . Hepatitis C Sister   . Cancer Brother        colon  . Diabetes Brother   . Heart disease Brother   . Hypertension Brother   . Diabetes Daughter   . Diabetes Brother   . Hypertension Sister   . Cancer Brother        prostate   Social History   Socioeconomic History  . Marital status: Married    Spouse name: Not on file  . Number of children: 4  . Years of education: Not on file  . Highest education level: 12th grade  Occupational History  . Occupation: Disabled  Tobacco Use  . Smoking  status: Current Every Day Smoker    Packs/day: 0.50    Years: 25.00    Pack years: 12.50    Types: Cigarettes  . Smokeless tobacco: Never Used  Vaping Use  . Vaping Use: Never used  Substance and Sexual Activity  . Alcohol use: Never  . Drug use: Never  . Sexual activity: Not Currently    Birth control/protection: Spermicide  Other Topics Concern  . Not on file  Social History Narrative  . Not on file   Social Determinants of Health   Financial Resource Strain: Low Risk   . Difficulty of Paying Living Expenses: Not hard at all  Food Insecurity: No Food Insecurity  . Worried About Charity fundraiser in the Last Year: Never true  . Ran Out of Food in the Last Year: Never true  Transportation Needs: No Transportation Needs  . Lack of Transportation (Medical): No  . Lack of Transportation (Non-Medical): No  Physical Activity: Insufficiently Active  . Days of Exercise per Week: 7 days  . Minutes of Exercise per Session: 20 min  Stress: No Stress Concern Present  . Feeling of Stress : Not at all  Social Connections: Moderately Integrated  . Frequency of Communication with Friends and Family: More than three times a week  . Frequency of Social Gatherings with Friends and Family: Three times a week  . Attends Religious Services: More than 4 times per year  . Active Member of Clubs or Organizations: No  . Attends Archivist Meetings: Never  . Marital Status: Married    Tobacco Counseling Ready to quit: No Counseling given: Not Answered   Clinical Intake:  Pre-visit preparation completed: Yes  Pain : 0-10 Pain Score: 5  Pain Type: Chronic pain Pain Location: Back Pain Orientation: Lower Pain Descriptors / Indicators: Aching, Sore Pain Onset: More than a month ago Pain Frequency: Constant     BMI - recorded: 40.93 Nutritional Status: BMI > 30  Obese Nutritional Risks: None Diabetes: Yes CBG done?: No Did pt. bring in CBG monitor from home?: No  How  often do you need to have someone help you when you read instructions, pamphlets, or other written materials from your doctor or pharmacy?: 1 - Never  Nutrition Risk Assessment:  Has the patient had any N/V/D within the last 2 months?  No  Does the patient have any non-healing wounds?  No  Has the patient had any unintentional weight loss or weight gain?  No   Diabetes:  Is the patient diabetic?  Yes  If diabetic, was a CBG obtained today?  No  Did the patient bring in their glucometer from home?  No  How often do you monitor your CBG's? Twice daily.   Financial Strains and Diabetes Management:  Are you having any financial strains with the device, your supplies or your medication? No .  Does the patient want to be seen by Chronic Care Management for management of their diabetes?  No  Would the patient like to be referred to a Nutritionist or for Diabetic Management?  No   Diabetic Exams:  Diabetic Eye Exam: Completed 05/2020 will request records from [facility].   Diabetic Foot Exam: Completed 12/22/19.   Interpreter Needed?: No  Information entered by :: Clemetine Marker LPN   Activities of Daily Living In your present state of health, do you have any difficulty performing the following activities: 08/06/2020  Hearing? N  Comment declines hearing aids  Vision? N  Difficulty concentrating or making decisions? N  Walking or climbing stairs? N  Dressing or bathing? N  Doing errands, shopping? N  Preparing Food and eating ? N  Using the Toilet? N  In the past six months, have you accidently leaked urine? Y  Comment wears pads for protection  Do you have problems with loss of bowel control? N  Managing your Medications? N  Managing your Finances? N  Housekeeping or managing your Housekeeping? N  Some recent data might be hidden    Patient Care Team: Juline Patch, MD as PCP - General (Family Medicine) Warnell Forester, NP as Nurse Practitioner  (Surgery)  Indicate any recent Medical Services you may have received from other than [facility] providers in the past year (date may be approximate).     Assessment:   This is a routine wellness examination for [place].  Hearing/Vision screen  Hearing Screening   '125Hz'  '250Hz'  '500Hz'  '1000Hz'  '2000Hz'  '3000Hz'  '4000Hz'  '6000Hz'  '8000Hz'   Right ear:           Left ear:           Comments: Pt denies hearing difficulty  Vision Screening Comments: Annual vision screenings done at [place] issues and exercise activities discussed: Current Exercise Habits: Home exercise routine, Type of exercise: walking, Time (Minutes): 20, Frequency (Times/Week): 7, Weekly Exercise (Minutes/Week): 140, Intensity: Mild, Exercise limited by: orthopedic condition(s)  Goals    . DIET - INCREASE WATER INTAKE     Recommend to drink at least 6-8 8oz glasses of water per day.    . Quit Smoking     Recommend to enroll in smoking cessation classes or discuss possibility of smoking cessation alternatives with provider      Depression Screen PHQ 2/9 Scores 08/06/2020 05/04/2020 08/03/2019 10/14/2018 08/04/2018 07/28/2018 06/28/2018  PHQ - 2 Score 0 0 0 0 0 0 0  PHQ- 9 Score - 0 - 1 0 0 -    Fall Risk Fall Risk  08/06/2020 05/04/2020 08/03/2019 10/14/2018 07/28/2018  Falls in the past year? '1 1 1 ' No No  Number falls in past yr: '1 1 1 ' - -  Injury with Fall? 0 0 0 - -  Risk for fall due to : History of fall(s);Impaired balance/gait;Orthopedic patient - History of fall(s);Impaired balance/gait - Impaired vision;Medication side effect;Impaired balance/gait;Other (Comment)  Risk for fall due to: Comment - - - - wears eyeglasses; ambulates with cane; R foot drop  Follow up Falls prevention discussed Falls evaluation completed Falls prevention discussed - -    Any stairs in or around the home? Yes  If so, are there any without handrails? No  Home  free of loose throw rugs in walkways, pet beds, electrical cords, etc? Yes   Adequate lighting in your home to reduce risk of falls? Yes   ASSISTIVE DEVICES UTILIZED TO PREVENT FALLS:  Life alert? No  Use of a cane, walker or w/c? Yes  Grab bars in the bathroom? Yes  Shower chair or bench in shower? Yes  Elevated toilet seat or a handicapped toilet? No   TIMED UP AND GO:  Was the test performed? Yes .  Length of time to ambulate 10 feet: 9 sec.   Gait slow and steady with assistive device  Cognitive Function:     6CIT Screen 08/06/2020 08/03/2019 07/28/2018  What Year? 0 points 0 points 0 points  What month? 0 points 0 points 0 points  What time? 0 points 0 points 0 points  Count back from 20 0 points 0 points 0 points  Months in reverse 0 points 0 points 4 points  Repeat phrase 0 points 0 points 6 points  Total Score 0 0 10    Immunizations Immunization History  Administered Date(s) Administered  . Influenza Inj Mdck Quad With Preservative 12/17/2017  . Influenza,inj,Quad PF,6+ Mos 10/14/2018  . Influenza-Unspecified 10/14/2018  . Tdap 08/04/2018    TDAP status: Up to date   Flu Vaccine status: Declined, Education has been provided regarding the importance of this vaccine but patient still declined. Advised may receive this vaccine at local pharmacy or Health Dept. Aware to provide a copy of the vaccination record if obtained from local pharmacy or Health Dept. Verbalized acceptance and understanding.   Pneumococcal vaccine status: due at age 59   Covid-19 vaccine status: Information provided on how to obtain vaccines.   Qualifies for Shingles Vaccine? Yes   Zostavax completed No   Shingrix Completed?: No.    Education has been provided regarding the importance of this vaccine. Patient has been advised to call insurance company to determine out of pocket expense if they have not yet received this vaccine. Advised may also receive vaccine at local pharmacy or Health Dept. Verbalized acceptance and understanding.  Screening Tests Health  Maintenance  Topic Date Due  . COVID-19 Vaccine (1) Never done  . MAMMOGRAM  09/02/2019  . HEMOGLOBIN A1C  06/20/2020  . INFLUENZA VACCINE  07/15/2020  . PNEUMOCOCCAL POLYSACCHARIDE VACCINE AGE 70-64 HIGH RISK  09/04/2028 (Originally 09/04/1963)  . FOOT EXAM  12/15/2020  . OPHTHALMOLOGY EXAM  06/12/2021  . PAP SMEAR-Modifier  08/05/2021  . COLONOSCOPY  08/24/2023  . TETANUS/TDAP  08/04/2028  . Hepatitis C Screening  Completed  . HIV Screening  Discontinued    Health Maintenance  Health Maintenance Due  Topic Date Due  . COVID-19 Vaccine (1) Never done  . MAMMOGRAM  09/02/2019  . HEMOGLOBIN A1C  06/20/2020  . INFLUENZA VACCINE  07/15/2020    Colorectal cancer screening: Completed 08/23/18. Repeat every 5 years   Mammogram status: Completed 09/01/18. Repeat every year    Bone density screening status: due at age 89  Lung Cancer Screening: (Low Dose CT Chest recommended if Age 63-80 years, 30 pack-year currently smoking OR have quit w/in 15years.) does not qualify.     Additional Screening:  Hepatitis C Screening: does qualify; Completed 08/04/18  Vision Screening: Recommended annual ophthalmology exams for early detection of glaucoma and other disorders of the eye. Is the patient up to date with their annual eye exam?  Yes  Who is the provider or what is the name of the office in  which the patient attends annual eye exams? Dr. Ellin Mayhew  Dental Screening: Recommended annual dental exams for proper oral hygiene  Community Resource Referral / Chronic Care Management: CRR required this visit?  No   CCM required this visit?  No      Plan:     I have personally reviewed and noted the following in the patient's chart:   . Medical and social history . Use of alcohol, tobacco or illicit drugs  . Current medications and supplements . Functional ability and status . Nutritional status . Physical activity . Advanced directives . List of other  physicians . Hospitalizations, surgeries, and ER visits in previous 12 months . Vitals . Screenings to include cognitive, depression, and faoills . Referrals and appntments  In addition, I have reviewed and discussed with patient certain preventive protocols, quality metrics, and best practice recommendations. A written personalized care plan for preventive services as well as general preventive health recommendations were provided to patient.     Clemetine Marker, LPN   0/51/1021   Nurse Notes: none

## 2020-08-06 NOTE — Patient Instructions (Signed)
Erica Ruiz , Thank you for taking time to come for your Medicare Wellness Visit. I appreciate your ongoing commitment to your health goals. Please review the following plan we discussed and let me know if I can assist you in the future.   Screening recommendations/referrals: Colonoscopy: done 08/23/18 Mammogram: done 09/01/18. Please call [phone] to schedule your mammogram.  Bone Density: due at age 59 Recommended yearly ophthalmology/optometry visit for glaucoma screening and checkup Recommended yearly dental visit for hygiene and checkup  Vaccinations: Influenza vaccine: postponed Pneumococcal vaccine: due at age 44 Tdap vaccine: done 08/04/18 Shingles vaccine: Shingrix discussed. Please contact your pharmacy for coverage information.  Covid-19: discussed  Advanced directives: Please bring a copy of your health care power of attorney and living will to the office at your convenience once you have completed those documents.   Conditions/risks identified: If you wish to quit smoking, help is available. For free tobacco cessation program offerings call the [facility] at [phone] or Live Well Line at [phone]. You may also visit [email or web address] or email [email or web address] for more information on other programs.   Next appointment: Follow up in one year for your annual wellness visit.   Preventive Care 40-64 Years, Female Preventive care refers to lifestyle choices and visits with your health care provider that can promote health and wellness. What does preventive care include?  A yearly physical exam. This is also called an annual well check.  Dental exams once or twice a year.  Routine eye exams. Ask your health care provider how often you should have your eyes checked.  Personal lifestyle choices, including:  Daily care of your teeth and gums.  Regular physical activity.  Eating a healthy diet.  Avoiding tobacco and drug  use.  Limiting alcohol use.  Practicing safe sex.  Taking low-dose aspirin daily starting at age 32.  Taking vitamin and mineral supplements as recommended by your health care provider. What happens during an annual well check? The services and screenings done by your health care provider during your annual well check will depend on your age, overall health, lifestyle risk factors, and family history of disease. Counseling  Your health care provider may ask you questions about your:  Alcohol use.  Tobacco use.  Drug use.  Emotional well-being.  Home and relationship well-being.  Sexual activity.  Eating habits.  Work and work Statistician.  Method of birth control.  Menstrual cycle.  Pregnancy history. Screening  You may have the following tests or measurements:  Height, weight, and BMI.  Blood pressure.  Lipid and cholesterol levels. These may be checked every 5 years, or more frequently if you are over 59 years old.  Skin check.  Lung cancer screening. You may have this screening every year starting at age 64 if you have a 30-pack-year history of smoking and currently smoke or have quit within the past 15 years.  Fecal occult blood test (FOBT) of the stool. You may have this test every year starting at age 78.  Flexible sigmoidoscopy or colonoscopy. You may have a sigmoidoscopy every 5 years or a colonoscopy every 10 years starting at age 69.  Hepatitis C blood test.  Hepatitis B blood test.  Sexually transmitted disease (STD) testing.  Diabetes screening. This is done by checking your blood sugar (glucose) after you have not eaten for a while (fasting). You may have this done every 1-3 years.  Mammogram. This may be done every 1-2 years. Talk to your health care  provider about when you should start having regular mammograms. This may depend on whether you have a family history of breast cancer.  BRCA-related cancer screening. This may be done if you  have a family history of breast, ovarian, tubal, or peritoneal cancers.  Pelvic exam and Pap test. This may be done every 3 years starting at age 69. Starting at age 25, this may be done every 5 years if you have a Pap test in combination with an HPV test.  Bone density scan. This is done to screen for osteoporosis. You may have this scan if you are at high risk for osteoporosis. Discuss your test results, treatment options, and if necessary, the need for more tests with your health care provider. Vaccines  Your health care provider may recommend certain vaccines, such as:  Influenza vaccine. This is recommended every year.  Tetanus, diphtheria, and acellular pertussis (Tdap, Td) vaccine. You may need a Td booster every 10 years.  Zoster vaccine. You may need this after age 10.  Pneumococcal 13-valent conjugate (PCV13) vaccine. You may need this if you have certain conditions and were not previously vaccinated.  Pneumococcal polysaccharide (PPSV23) vaccine. You may need one or two doses if you smoke cigarettes or if you have certain conditions. Talk to your health care provider about which screenings and vaccines you need and how often you need them. This information is not intended to replace advice given to you by your health care provider. Make sure you discuss any questions you have with your health care provider. Document Released: 12/28/2015 Document Revised: 08/20/2016 Document Reviewed: 10/02/2015 Elsevier Interactive Patient Education  2017 [employer] Prevention in the Home Falls can cause injuries. They can happen to people of all ages. There are many things you can do to make your home safe and to help prevent falls. What can I do on the outside of my home?  Regularly fix the edges of walkways and driveways and fix any cracks.  Remove anything that might make you trip as you walk through a door, such as a raised step or threshold.  Trim any bushes or trees on  the path to your home.  Use bright outdoor lighting.  Clear any walking paths of anything that might make someone trip, such as rocks or tools.  Regularly check to see if handrails are loose or broken. Make sure that both sides of any steps have handrails.  Any raised decks and porches should have guardrails on the edges.  Have any leaves, snow, or ice cleared regularly.  Use sand or salt on walking paths during winter.  Clean up any spills in your garage right away. This includes oil or grease spills. What can I do in the bathroom?  Use night lights.  Install grab bars by the toilet and in the tub and shower. Do not use towel bars as grab bars.  Use non-skid mats or decals in the tub or shower.  If you need to sit down in the shower, use a plastic, non-slip stool.  Keep the floor dry. Clean up any water that spills on the floor as soon as it happens.  Remove soap buildup in the tub or shower regularly.  Attach bath mats securely with double-sided non-slip rug tape.  Do not have throw rugs and other things on the floor that can make you trip. What can I do in the bedroom?  Use night lights.  Make sure that you have a light by  your bed that is easy to reach.  Do not use any sheets or blankets that are too big for your bed. They should not hang down onto the floor.  Have a firm chair that has side arms. You can use this for support while you get dressed.  Do not have throw rugs and other things on the floor that can make you trip. What can I do in the kitchen?  Clean up any spills right away.  Avoid walking on wet floors.  Keep items that you use a lot in easy-to-reach places.  If you need to reach something above you, use a strong step stool that has a grab bar.  Keep electrical cords out of the way.  Do not use floor polish or wax that makes floors slippery. If you must use wax, use non-skid floor wax.  Do not have throw rugs and other things on the floor that  can make you trip. What can I do with my stairs?  Do not leave any items on the stairs.  Make sure that there are handrails on both sides of the stairs and use them. Fix handrails that are broken or loose. Make sure that handrails are as long as the stairways.  Check any carpeting to make sure that it is firmly attached to the stairs. Fix any carpet that is loose or worn.  Avoid having throw rugs at the top or bottom of the stairs. If you do have throw rugs, attach them to the floor with carpet tape.  Make sure that you have a light switch at the top of the stairs and the bottom of the stairs. If you do not have them, ask someone to add them for you. What else can I do to help prevent falls?  Wear shoes that:  Do not have high heels.  Have rubber bottoms.  Are comfortable and fit you well.  Are closed at the toe. Do not wear sandals.  If you use a stepladder:  Make sure that it is fully opened. Do not climb a closed stepladder.  Make sure that both sides of the stepladder are locked into place.  Ask someone to hold it for you, if possible.  Clearly mark and make sure that you can see:  Any grab bars or handrails.  First and last steps.  Where the edge of each step is.  Use tools that help you move around (mobility aids) if they are needed. These include:  Canes.  Walkers.  Scooters.  Crutches.  Turn on the lights when you go into a dark area. Replace any light bulbs as soon as they burn out.  Set up your furniture so you have a clear path. Avoid moving your furniture around.  If any of your floors are uneven, fix them.  If there are any pets around you, be aware of where they are.  Review your medicines with your doctor. Some medicines can make you feel dizzy. This can increase your chance of falling. Ask your doctor what other things that you can do to help prevent falls. This information is not intended to replace advice given to you by your health care  provider. Make sure you discuss any questions you have with your health care provider. Document Released: 09/27/2009 Document Revised: 05/08/2016 Document Reviewed: 01/05/2015 Elsevier Interactive Patient Education  2017 [employer].

## 2020-09-17 DIAGNOSIS — M4720 Other spondylosis with radiculopathy, site unspecified: Secondary | ICD-10-CM | POA: Diagnosis not present

## 2020-09-27 ENCOUNTER — Other Ambulatory Visit: Payer: Self-pay | Admitting: Family Medicine

## 2020-09-27 DIAGNOSIS — I10 Essential (primary) hypertension: Secondary | ICD-10-CM

## 2020-10-25 ENCOUNTER — Other Ambulatory Visit: Payer: Self-pay | Admitting: Family Medicine

## 2020-10-25 DIAGNOSIS — I10 Essential (primary) hypertension: Secondary | ICD-10-CM

## 2020-10-25 NOTE — Telephone Encounter (Signed)
Requested medication (s) are due for refill today- yes  Requested medication (s) are on the active medication list -yes  Future visit scheduled -yes  Last refill: 07/28/20  Notes to clinic: Patient fails lab protocol ( last labs 2019)- sent for review- appt 11/06/20  Requested Prescriptions  Pending Prescriptions Disp Refills   hydrochlorothiazide (HYDRODIURIL) 25 MG tablet [Pharmacy Med Name: HYDROCHLOROTHIAZIDE 25MG  TABLETS] 90 tablet 1    Sig: TAKE 1 TABLET(25 MG) BY MOUTH DAILY      Cardiovascular: Diuretics - Thiazide Failed - 10/25/2020  2:22 PM      Failed - Ca in normal range and within 360 days    Calcium  Date Value Ref Range Status  10/14/2018 9.6 8.7 - 10.2 mg/dL Final          Failed - Cr in normal range and within 360 days    Creatinine, Ser  Date Value Ref Range Status  10/14/2018 0.73 0.57 - 1.00 mg/dL Final          Failed - K in normal range and within 360 days    Potassium  Date Value Ref Range Status  10/14/2018 4.3 3.5 - 5.2 mmol/L Final          Failed - Na in normal range and within 360 days    Sodium  Date Value Ref Range Status  10/14/2018 140 134 - 144 mmol/L Final          Passed - Last BP in normal range    BP Readings from Last 1 Encounters:  08/06/20 128/72          Passed - Valid encounter within last 6 months    Recent Outpatient Visits           5 months ago Essential hypertension   [place], Deanna C, MD   1 year ago Essential hypertension   [facility] Juline Patch, MD   2 years ago Essential hypertension   [facility] Juline Patch, MD   2 years ago Annual physical exam   [facility] Juline Patch, MD   2 years ago Type 2 diabetes mellitus without complication, without long-term current use of insulin ([place])   [facility] Juline Patch, MD       Future Appointments             In 1 week Juline Patch, MD [facility], [facility]                 Requested Prescriptions  Pending Prescriptions Disp Refills   hydrochlorothiazide (HYDRODIURIL) 25 MG tablet [Pharmacy Med Name: HYDROCHLOROTHIAZIDE 25MG  TABLETS] 90 tablet 1    Sig: TAKE 1 TABLET(25 MG) BY MOUTH DAILY      Cardiovascular: Diuretics - Thiazide Failed - 10/25/2020  2:22 PM      Failed - Ca in normal range and within 360 days    Calcium  Date Value Ref Range Status  10/14/2018 9.6 8.7 - 10.2 mg/dL Final          Failed - Cr in normal range and within 360 days    Creatinine, Ser  Date Value Ref Range Status  10/14/2018 0.73 0.57 - 1.00 mg/dL Final          Failed - K in normal range and within 360 days    Potassium  Date Value Ref Range Status  10/14/2018 4.3 3.5 - 5.2 mmol/L Final  Failed - Na in normal range and within 360 days    Sodium  Date Value Ref Range Status  10/14/2018 140 134 - 144 mmol/L Final          Passed - Last BP in normal range    BP Readings from Last 1 Encounters:  08/06/20 128/72          Passed - Valid encounter within last 6 months    Recent Outpatient Visits           5 months ago Essential hypertension   [place], Deanna C, MD   1 year ago Essential hypertension   [place], Deanna C, MD   2 years ago Essential hypertension   [facility] Juline Patch, MD   2 years ago Annual physical exam   [facility] Juline Patch, MD   2 years ago Type 2 diabetes mellitus without complication, without long-term current use of insulin ([place])   [facility] Juline Patch, MD       Future Appointments             In 1 week Juline Patch, MD [facility], [facility]

## 2020-10-30 DIAGNOSIS — M5116 Intervertebral disc disorders with radiculopathy, lumbar region: Secondary | ICD-10-CM | POA: Diagnosis not present

## 2020-10-30 DIAGNOSIS — M4716 Other spondylosis with myelopathy, lumbar region: Secondary | ICD-10-CM | POA: Diagnosis not present

## 2020-10-30 DIAGNOSIS — M25562 Pain in left knee: Secondary | ICD-10-CM | POA: Diagnosis not present

## 2020-10-30 DIAGNOSIS — Z79891 Long term (current) use of opiate analgesic: Secondary | ICD-10-CM | POA: Diagnosis not present

## 2020-10-30 DIAGNOSIS — M25561 Pain in right knee: Secondary | ICD-10-CM | POA: Diagnosis not present

## 2020-10-30 DIAGNOSIS — G894 Chronic pain syndrome: Secondary | ICD-10-CM | POA: Diagnosis not present

## 2020-10-30 DIAGNOSIS — Z79899 Other long term (current) drug therapy: Secondary | ICD-10-CM | POA: Diagnosis not present

## 2020-11-06 ENCOUNTER — Other Ambulatory Visit: Payer: Self-pay

## 2020-11-06 ENCOUNTER — Ambulatory Visit (INDEPENDENT_AMBULATORY_CARE_PROVIDER_SITE_OTHER): Payer: PPO | Admitting: Family Medicine

## 2020-11-06 ENCOUNTER — Encounter: Payer: Self-pay | Admitting: Family Medicine

## 2020-11-06 DIAGNOSIS — I10 Essential (primary) hypertension: Secondary | ICD-10-CM | POA: Diagnosis not present

## 2020-11-06 MED ORDER — HYDROCHLOROTHIAZIDE 25 MG PO TABS: ORAL_TABLET | ORAL | 1 refills | Status: DC

## 2020-11-06 MED ORDER — LISINOPRIL 10 MG PO TABS: 10.0000 mg | ORAL_TABLET | Freq: Every day | ORAL | 1 refills | Status: DC

## 2020-11-06 NOTE — Progress Notes (Signed)
Date:  11/06/2020   Name:  Erica Ruiz [facility]   DOB:  1961-08-02   MRN:  [id number]   Chief Complaint: Hypertension  Hypertension This is a chronic problem. The current episode started more than 1 year ago. The problem has been gradually improving since onset. The problem is controlled. Pertinent negatives include no anxiety, blurred vision, chest pain, headaches, malaise/fatigue, neck pain, orthopnea, palpitations, peripheral edema, PND, shortness of breath or sweats. There are no associated agents to hypertension. Risk factors for coronary artery disease include diabetes mellitus. Past treatments include diuretics and ACE inhibitors. There are no compliance problems.  There is no history of angina, kidney disease, CAD/MI, CVA, heart failure, left ventricular hypertrophy, PVD or retinopathy. There is no history of chronic renal disease, a hypertension causing med or renovascular disease.    Lab Results  Component Value Date   CREATININE 0.7 06/21/2020   BUN 14 06/21/2020   NA 140 10/14/2018   K 4.3 10/14/2018   CL 101 10/14/2018   CO2 23 10/14/2018   Lab Results  Component Value Date   CHOL 224 (A) 06/21/2020   HDL 43 06/21/2020   LDLCALC 145 06/21/2020   TRIG 178 (A) 06/21/2020   Lab Results  Component Value Date   TSH 0.852 04/16/2018   Lab Results  Component Value Date   HGBA1C 6.5 06/21/2020   Lab Results  Component Value Date   WBC 7.4 04/05/2018   HGB 14.3 04/05/2018   HCT 41.6 04/05/2018   MCV 87.3 04/05/2018   PLT 178 04/05/2018   Lab Results  Component Value Date   ALT 19 06/21/2020   AST 16 06/21/2020     Review of Systems  Constitutional: Negative.  Negative for chills, fatigue, fever, malaise/fatigue and unexpected weight change.  HENT: Negative for congestion, ear discharge, ear pain, rhinorrhea, sinus pressure, sneezing and sore throat.   Eyes: Negative for blurred vision, photophobia, pain, discharge, redness and itching.  Respiratory:  Negative for cough, shortness of breath, wheezing and stridor.   Cardiovascular: Negative for chest pain, palpitations, orthopnea and PND.  Gastrointestinal: Negative for abdominal pain, blood in stool, constipation, diarrhea, nausea and vomiting.  Endocrine: Negative for cold intolerance, heat intolerance, polydipsia, polyphagia and polyuria.  Genitourinary: Negative for dysuria, flank pain, frequency, hematuria, menstrual problem, pelvic pain, urgency, vaginal bleeding and vaginal discharge.  Musculoskeletal: Negative for arthralgias, back pain, myalgias and neck pain.  Skin: Negative for rash.  Allergic/Immunologic: Negative for environmental allergies and food allergies.  Neurological: Negative for dizziness, weakness, light-headedness, numbness and headaches.  Hematological: Negative for adenopathy. Does not bruise/bleed easily.  Psychiatric/Behavioral: Negative for dysphoric mood. The patient is not nervous/anxious.     Patient Active Problem List   Diagnosis Date Noted  . Type 2 diabetes mellitus with hyperglycemia, without long-term current use of insulin ([place]) 09/17/2018  . Tobacco dependence 09/17/2018  . Special screening for malignant neoplasms, colon   . Benign neoplasm of transverse colon   . Polyp of sigmoid colon   . Rectal polyp   . Hyperglycemia 04/16/2018  . Essential hypertension 04/16/2018  . Venous insufficiency 04/16/2018  . Abnormal weight gain 04/16/2018    No Known Allergies  Past Surgical History:  Procedure Laterality Date  . CHOLECYSTECTOMY    . COLON SURGERY    . COLONOSCOPY WITH PROPOFOL N/A 08/23/2018   Procedure: COLONOSCOPY WITH PROPOFOL with biopsies;  Surgeon: Lucilla Lame, MD;  Location: [place];  Service: Endoscopy;  Laterality: N/A;  DIABETIC  .  COLOSTOMY REVERSAL    . KNEE SURGERY    . POLYPECTOMY N/A 08/23/2018   Procedure: POLYPECTOMY;  Surgeon: Lucilla Lame, MD;  Location: [place];  Service: Endoscopy;  Laterality:  N/A;  . TONSILLECTOMY    . TUBAL LIGATION      Social History   Tobacco Use  . Smoking status: Current Every Day Smoker    Packs/day: 0.50    Years: 25.00    Pack years: 12.50    Types: Cigarettes  . Smokeless tobacco: Never Used  Vaping Use  . Vaping Use: Never used  Substance Use Topics  . Alcohol use: Never  . Drug use: Never     Medication list has been reviewed and updated.  Current Meds  Medication Sig  . DULoxetine (CYMBALTA) 30 MG capsule TK ONE C PO ONCE A DAY FOR 90 DAYS- pain management  . empagliflozin (JARDIANCE) 25 MG TABS tablet Take 25 mg by mouth daily. Hilary  . fluticasone (FLONASE) 50 MCG/ACT nasal spray Place 2 sprays into both nostrils daily.  Marland Kitchen gabapentin (NEURONTIN) 600 MG tablet TK 1 T PO BID- pain management  . glucose blood test strip 1 each by Other route 2 (two) times daily.  . hydrochlorothiazide (HYDRODIURIL) 25 MG tablet TAKE 1 TABLET(25 MG) BY MOUTH DAILY  . Lancets (ONETOUCH ULTRASOFT) lancets 1 each by Other route 2 (two) times daily.  Marland Kitchen lisinopril (ZESTRIL) 10 MG tablet TAKE 1 TABLET BY MOUTH ONCE DAILY  . metFORMIN (GLUCOPHAGE-XR) 500 MG 24 hr tablet Take 1,000 mg by mouth 2 (two) times daily. Hilary  . morphine (MS CONTIN) 15 MG 12 hr tablet TK 1 T PO  Q 8 H FOR 30 DAYS- pain management  . naproxen sodium (ALEVE) 220 MG tablet Take 220 mg by mouth every 12 (twelve) hours as needed.  Marland Kitchen NARCAN 4 MG/0.1ML LIQD nasal spray kit NAR REP ALN  . Oxycodone HCl 10 MG TABS Take 10 mg by mouth 4 (four) times daily as needed. Pain clinic  . tiZANidine (ZANAFLEX) 4 MG tablet TK 1 T PO Q 8 H PRF SPASMS- pain management    PHQ 2/9 Scores 11/06/2020 08/06/2020 05/04/2020 08/03/2019  PHQ - 2 Score 0 0 0 0  PHQ- 9 Score 0 - 0 -    GAD 7 : Generalized Anxiety Score 11/06/2020 05/04/2020  Nervous, Anxious, on Edge 0 0  Control/stop worrying 0 0  Worry too much - different things 0 0  Trouble relaxing 0 0  Restless 0 0  Easily annoyed or irritable 0 0    Afraid - awful might happen 0 0  Total GAD 7 Score 0 0    BP Readings from Last 3 Encounters:  11/06/20 120/62  08/06/20 128/72  05/04/20 124/62    Physical Exam Vitals and nursing note reviewed.  Constitutional:      Appearance: She is well-developed.  HENT:     Head: Normocephalic.     Right Ear: Tympanic membrane, ear canal and external ear normal.     Left Ear: Tympanic membrane, ear canal and external ear normal.     Nose: Nose normal.     Mouth/Throat:     Mouth: Mucous membranes are moist.  Eyes:     General: Lids are everted, no foreign bodies appreciated. No scleral icterus.       Left eye: No foreign body or hordeolum.     Conjunctiva/sclera: Conjunctivae normal.     Right eye: Right conjunctiva is not injected.  Left eye: Left conjunctiva is not injected.     Pupils: Pupils are equal, round, and reactive to light.  Neck:     Thyroid: No thyromegaly.     Vascular: No JVD.     Trachea: No tracheal deviation.  Cardiovascular:     Rate and Rhythm: Normal rate and regular rhythm.     Heart sounds: Normal heart sounds. No murmur heard.  No friction rub. No gallop.   Pulmonary:     Effort: Pulmonary effort is normal. No respiratory distress.     Breath sounds: Normal breath sounds. No wheezing, rhonchi or rales.  Abdominal:     General: Bowel sounds are normal.     Palpations: Abdomen is soft. There is no mass.     Tenderness: There is no abdominal tenderness. There is no guarding or rebound.  Musculoskeletal:        General: No tenderness. Normal range of motion.     Cervical back: Normal range of motion and neck supple.  Lymphadenopathy:     Cervical: No cervical adenopathy.  Skin:    General: Skin is warm.     Findings: No rash.  Neurological:     Mental Status: She is alert and oriented to person, place, and time.     Cranial Nerves: No cranial nerve deficit.     Deep Tendon Reflexes: Reflexes normal.  Psychiatric:        Mood and Affect: Mood is  not anxious or depressed.     Wt Readings from Last 3 Encounters:  11/06/20 210 lb (95.3 kg)  08/06/20 216 lb 9.6 oz (98.2 kg)  05/04/20 228 lb (103.4 kg)    BP 120/62   Pulse 74   Ht _0  (1.549 m)   Wt 210 lb (95.3 kg)   BMI 39.68 kg/m   Assessment and Plan:  1. Essential hypertension Chronic.  Controlled.  Stable.  Patient will continue hydrochlorothiazide 25 mg and lisinopril 10 mg both once a day.  Patient's chart was reviewed for previous GFR renal function and it was acceptable at this time.  We will recheck patient in 6 months. - hydrochlorothiazide (HYDRODIURIL) 25 MG tablet; 1 tablet daily  Dispense: 90 tablet; Refill: 1 - lisinopril (ZESTRIL) 10 MG tablet; Take 1 tablet (10 mg total) by mouth daily.  Dispense: 90 tablet; Refill: 1  I spent 22 minutes with this patient, More than 50% of that time was spent in face to face education, counseling and care coordination.

## 2020-12-05 DIAGNOSIS — Z79891 Long term (current) use of opiate analgesic: Secondary | ICD-10-CM | POA: Diagnosis not present

## 2020-12-05 DIAGNOSIS — M25562 Pain in left knee: Secondary | ICD-10-CM | POA: Diagnosis not present

## 2020-12-05 DIAGNOSIS — M5116 Intervertebral disc disorders with radiculopathy, lumbar region: Secondary | ICD-10-CM | POA: Diagnosis not present

## 2020-12-05 DIAGNOSIS — M4716 Other spondylosis with myelopathy, lumbar region: Secondary | ICD-10-CM | POA: Diagnosis not present

## 2020-12-05 DIAGNOSIS — G894 Chronic pain syndrome: Secondary | ICD-10-CM | POA: Diagnosis not present

## 2020-12-05 DIAGNOSIS — M25561 Pain in right knee: Secondary | ICD-10-CM | POA: Diagnosis not present

## 2020-12-05 DIAGNOSIS — Z79899 Other long term (current) drug therapy: Secondary | ICD-10-CM | POA: Diagnosis not present

## 2021-01-02 DIAGNOSIS — Z79891 Long term (current) use of opiate analgesic: Secondary | ICD-10-CM | POA: Diagnosis not present

## 2021-01-02 DIAGNOSIS — M25562 Pain in left knee: Secondary | ICD-10-CM | POA: Diagnosis not present

## 2021-01-02 DIAGNOSIS — M25561 Pain in right knee: Secondary | ICD-10-CM | POA: Diagnosis not present

## 2021-01-02 DIAGNOSIS — M4716 Other spondylosis with myelopathy, lumbar region: Secondary | ICD-10-CM | POA: Diagnosis not present

## 2021-01-02 DIAGNOSIS — Z79899 Other long term (current) drug therapy: Secondary | ICD-10-CM | POA: Diagnosis not present

## 2021-01-02 DIAGNOSIS — M5116 Intervertebral disc disorders with radiculopathy, lumbar region: Secondary | ICD-10-CM | POA: Diagnosis not present

## 2021-01-02 DIAGNOSIS — G894 Chronic pain syndrome: Secondary | ICD-10-CM | POA: Diagnosis not present

## 2021-01-03 DIAGNOSIS — I152 Hypertension secondary to endocrine disorders: Secondary | ICD-10-CM | POA: Diagnosis not present

## 2021-01-03 DIAGNOSIS — E1165 Type 2 diabetes mellitus with hyperglycemia: Secondary | ICD-10-CM | POA: Diagnosis not present

## 2021-01-03 DIAGNOSIS — E1159 Type 2 diabetes mellitus with other circulatory complications: Secondary | ICD-10-CM | POA: Diagnosis not present

## 2021-01-03 DIAGNOSIS — F17200 Nicotine dependence, unspecified, uncomplicated: Secondary | ICD-10-CM | POA: Diagnosis not present

## 2021-01-30 DIAGNOSIS — M4716 Other spondylosis with myelopathy, lumbar region: Secondary | ICD-10-CM | POA: Diagnosis not present

## 2021-01-30 DIAGNOSIS — M25561 Pain in right knee: Secondary | ICD-10-CM | POA: Diagnosis not present

## 2021-01-30 DIAGNOSIS — M25562 Pain in left knee: Secondary | ICD-10-CM | POA: Diagnosis not present

## 2021-01-30 DIAGNOSIS — Z79891 Long term (current) use of opiate analgesic: Secondary | ICD-10-CM | POA: Diagnosis not present

## 2021-01-30 DIAGNOSIS — G894 Chronic pain syndrome: Secondary | ICD-10-CM | POA: Diagnosis not present

## 2021-01-30 DIAGNOSIS — Z79899 Other long term (current) drug therapy: Secondary | ICD-10-CM | POA: Diagnosis not present

## 2021-01-30 DIAGNOSIS — M5116 Intervertebral disc disorders with radiculopathy, lumbar region: Secondary | ICD-10-CM | POA: Diagnosis not present

## 2021-03-16 ENCOUNTER — Other Ambulatory Visit: Payer: Self-pay | Admitting: Family Medicine

## 2021-03-16 DIAGNOSIS — I10 Essential (primary) hypertension: Secondary | ICD-10-CM

## 2021-03-27 DIAGNOSIS — M25561 Pain in right knee: Secondary | ICD-10-CM | POA: Diagnosis not present

## 2021-03-27 DIAGNOSIS — M25562 Pain in left knee: Secondary | ICD-10-CM | POA: Diagnosis not present

## 2021-03-27 DIAGNOSIS — Z79891 Long term (current) use of opiate analgesic: Secondary | ICD-10-CM | POA: Diagnosis not present

## 2021-03-27 DIAGNOSIS — G894 Chronic pain syndrome: Secondary | ICD-10-CM | POA: Diagnosis not present

## 2021-03-27 DIAGNOSIS — Z79899 Other long term (current) drug therapy: Secondary | ICD-10-CM | POA: Diagnosis not present

## 2021-03-27 DIAGNOSIS — M4716 Other spondylosis with myelopathy, lumbar region: Secondary | ICD-10-CM | POA: Diagnosis not present

## 2021-03-27 DIAGNOSIS — M5116 Intervertebral disc disorders with radiculopathy, lumbar region: Secondary | ICD-10-CM | POA: Diagnosis not present

## 2021-04-08 DIAGNOSIS — E1159 Type 2 diabetes mellitus with other circulatory complications: Secondary | ICD-10-CM | POA: Diagnosis not present

## 2021-04-08 DIAGNOSIS — F17200 Nicotine dependence, unspecified, uncomplicated: Secondary | ICD-10-CM | POA: Diagnosis not present

## 2021-04-08 DIAGNOSIS — E1165 Type 2 diabetes mellitus with hyperglycemia: Secondary | ICD-10-CM | POA: Diagnosis not present

## 2021-04-08 DIAGNOSIS — I152 Hypertension secondary to endocrine disorders: Secondary | ICD-10-CM | POA: Diagnosis not present

## 2021-04-08 LAB — HM DIABETES FOOT EXAM: HM Diabetic Foot Exam: NORMAL

## 2021-04-08 LAB — HEMOGLOBIN A1C: Hemoglobin A1C: 6.2

## 2021-04-23 ENCOUNTER — Ambulatory Visit (INDEPENDENT_AMBULATORY_CARE_PROVIDER_SITE_OTHER): Payer: PPO | Admitting: Family Medicine

## 2021-04-23 ENCOUNTER — Other Ambulatory Visit: Payer: Self-pay

## 2021-04-23 ENCOUNTER — Encounter: Payer: Self-pay | Admitting: Family Medicine

## 2021-04-23 VITALS — BP 120/80 | HR 68 | Ht 61.0 in | Wt 213.0 lb

## 2021-04-23 DIAGNOSIS — R635 Abnormal weight gain: Secondary | ICD-10-CM

## 2021-04-23 DIAGNOSIS — I10 Essential (primary) hypertension: Secondary | ICD-10-CM

## 2021-04-23 DIAGNOSIS — F17200 Nicotine dependence, unspecified, uncomplicated: Secondary | ICD-10-CM

## 2021-04-23 DIAGNOSIS — E7801 Familial hypercholesterolemia: Secondary | ICD-10-CM

## 2021-04-23 DIAGNOSIS — Z1231 Encounter for screening mammogram for malignant neoplasm of breast: Secondary | ICD-10-CM | POA: Diagnosis not present

## 2021-04-23 MED ORDER — HYDROCHLOROTHIAZIDE 25 MG PO TABS: ORAL_TABLET | ORAL | 1 refills | Status: DC

## 2021-04-23 MED ORDER — LISINOPRIL 10 MG PO TABS: 10.0000 mg | ORAL_TABLET | Freq: Every day | ORAL | 1 refills | Status: DC

## 2021-04-23 NOTE — Patient Instructions (Signed)

## 2021-04-23 NOTE — Progress Notes (Signed)
Date:  04/23/2021   Name:  Erica Ruiz [facility]   DOB:  December 11, 1961   MRN:  [id number]   Chief Complaint: Hypertension  Hypertension This is a chronic problem. The current episode started more than 1 year ago. The problem has been gradually improving since onset. The problem is controlled. Pertinent negatives include no anxiety, blurred vision, chest pain, headaches, malaise/fatigue, neck pain, orthopnea, palpitations, peripheral edema, PND, shortness of breath or sweats. There are no associated agents to hypertension. Past treatments include ACE inhibitors and diuretics. The current treatment provides moderate improvement. There are no compliance problems.  There is no history of angina, kidney disease, CAD/MI, CVA, heart failure, left ventricular hypertrophy, PVD or retinopathy. There is no history of chronic renal disease, a hypertension causing med or renovascular disease.  Hyperlipidemia This is a chronic problem. The current episode started more than 1 year ago. The problem is controlled. Recent lipid tests were reviewed and are normal. Exacerbating diseases include obesity. She has no history of chronic renal disease. Pertinent negatives include no chest pain, focal sensory loss, focal weakness, leg pain, myalgias or shortness of breath. Current antihyperlipidemic treatment includes diet change. The current treatment provides moderate improvement of lipids. There are no compliance problems.  Risk factors for coronary artery disease include hypertension and dyslipidemia.    Lab Results  Component Value Date   CREATININE 0.7 06/21/2020   BUN 14 06/21/2020   NA 140 10/14/2018   K 4.3 10/14/2018   CL 101 10/14/2018   CO2 23 10/14/2018   Lab Results  Component Value Date   CHOL 224 (A) 06/21/2020   HDL 43 06/21/2020   LDLCALC 145 06/21/2020   TRIG 178 (A) 06/21/2020   Lab Results  Component Value Date   TSH 0.852 04/16/2018   Lab Results  Component Value Date   HGBA1C 6.2  04/08/2021   Lab Results  Component Value Date   WBC 7.4 04/05/2018   HGB 14.3 04/05/2018   HCT 41.6 04/05/2018   MCV 87.3 04/05/2018   PLT 178 04/05/2018   Lab Results  Component Value Date   ALT 19 06/21/2020   AST 16 06/21/2020     Review of Systems  Constitutional: Negative.  Negative for chills, fatigue, fever, malaise/fatigue and unexpected weight change.  HENT: Negative for congestion, ear discharge, ear pain, rhinorrhea, sinus pressure, sneezing and sore throat.   Eyes: Negative for blurred vision, photophobia, pain, discharge, redness and itching.  Respiratory: Negative for cough, shortness of breath, wheezing and stridor.   Cardiovascular: Negative for chest pain, palpitations, orthopnea and PND.  Gastrointestinal: Negative for abdominal pain, blood in stool, constipation, diarrhea, nausea and vomiting.  Endocrine: Negative for cold intolerance, heat intolerance, polydipsia, polyphagia and polyuria.  Genitourinary: Negative for dysuria, flank pain, frequency, hematuria, menstrual problem, pelvic pain, urgency, vaginal bleeding and vaginal discharge.  Musculoskeletal: Negative for arthralgias, back pain, myalgias and neck pain.  Skin: Negative for rash.  Allergic/Immunologic: Negative for environmental allergies and food allergies.  Neurological: Negative for dizziness, focal weakness, weakness, light-headedness, numbness and headaches.  Hematological: Negative for adenopathy. Does not bruise/bleed easily.  Psychiatric/Behavioral: Negative for dysphoric mood. The patient is not nervous/anxious.     Patient Active Problem List   Diagnosis Date Noted  . Type 2 diabetes mellitus with hyperglycemia, without long-term current use of insulin ([place]) 09/17/2018  . Tobacco dependence 09/17/2018  . Special screening for malignant neoplasms, colon   . Benign neoplasm of transverse colon   . Polyp  of sigmoid colon   . Rectal polyp   . Hyperglycemia 04/16/2018  . Essential  hypertension 04/16/2018  . Venous insufficiency 04/16/2018  . Abnormal weight gain 04/16/2018    No Known Allergies  Past Surgical History:  Procedure Laterality Date  . CHOLECYSTECTOMY    . COLON SURGERY    . COLONOSCOPY WITH PROPOFOL N/A 08/23/2018   Procedure: COLONOSCOPY WITH PROPOFOL with biopsies;  Surgeon: Lucilla Lame, MD;  Location: [place];  Service: Endoscopy;  Laterality: N/A;  DIABETIC  . COLOSTOMY REVERSAL    . KNEE SURGERY    . POLYPECTOMY N/A 08/23/2018   Procedure: POLYPECTOMY;  Surgeon: Lucilla Lame, MD;  Location: [place];  Service: Endoscopy;  Laterality: N/A;  . TONSILLECTOMY    . TUBAL LIGATION      Social History   Tobacco Use  . Smoking status: Current Every Day Smoker    Packs/day: 0.50    Years: 25.00    Pack years: 12.50    Types: Cigarettes  . Smokeless tobacco: Never Used  Vaping Use  . Vaping Use: Never used  Substance Use Topics  . Alcohol use: Never  . Drug use: Never     Medication list has been reviewed and updated.  Current Meds  Medication Sig  . DULoxetine (CYMBALTA) 30 MG capsule TK ONE C PO ONCE A DAY FOR 90 DAYS- pain management  . empagliflozin (JARDIANCE) 25 MG TABS tablet Take 25 mg by mouth daily. Hilary  . fluticasone (FLONASE) 50 MCG/ACT nasal spray Place 2 sprays into both nostrils daily.  Marland Kitchen gabapentin (NEURONTIN) 600 MG tablet TK 1 T PO BID- pain management  . glucose blood test strip 1 each by Other route 2 (two) times daily.  . hydrochlorothiazide (HYDRODIURIL) 25 MG tablet 1 tablet daily  . Lancets (ONETOUCH ULTRASOFT) lancets 1 each by Other route 2 (two) times daily.  Marland Kitchen lisinopril (ZESTRIL) 10 MG tablet Take 1 tablet (10 mg total) by mouth daily.  . metFORMIN (GLUCOPHAGE-XR) 500 MG 24 hr tablet Take 1,000 mg by mouth 2 (two) times daily. Hilary  . morphine (MS CONTIN) 15 MG 12 hr tablet TK 1 T PO  Q 8 H FOR 30 DAYS- pain management  . naproxen sodium (ALEVE) 220 MG tablet Take 220 mg by mouth  every 12 (twelve) hours as needed.  Marland Kitchen NARCAN 4 MG/0.1ML LIQD nasal spray kit NAR REP ALN  . Oxycodone HCl 10 MG TABS Take 10 mg by mouth 4 (four) times daily as needed. Pain clinic  . tiZANidine (ZANAFLEX) 4 MG tablet TK 1 T PO Q 8 H PRF SPASMS- pain management    PHQ 2/9 Scores 04/23/2021 11/06/2020 08/06/2020 05/04/2020  PHQ - 2 Score 0 0 0 0  PHQ- 9 Score 0 0 - 0    GAD 7 : Generalized Anxiety Score 04/23/2021 11/06/2020 05/04/2020  Nervous, Anxious, on Edge 0 0 0  Control/stop worrying 0 0 0  Worry too much - different things 0 0 0  Trouble relaxing 0 0 0  Restless 0 0 0  Easily annoyed or irritable 0 0 0  Afraid - awful might happen 0 0 0  Total GAD 7 Score 0 0 0    BP Readings from Last 3 Encounters:  04/23/21 120/80  11/06/20 120/62  08/06/20 128/72    Physical Exam Vitals and nursing note reviewed.  Constitutional:      Appearance: She is well-developed.  HENT:     Head: Normocephalic.  Right Ear: Tympanic membrane, ear canal and external ear normal.     Left Ear: Tympanic membrane, ear canal and external ear normal.     Nose: Nose normal.     Mouth/Throat:     Lips: Pink.     Mouth: Mucous membranes are moist.     Dentition: Normal dentition.     Tongue: No lesions.  Eyes:     General: Lids are everted, no foreign bodies appreciated. No scleral icterus.       Left eye: No foreign body or hordeolum.     Conjunctiva/sclera: Conjunctivae normal.     Right eye: Right conjunctiva is not injected.     Left eye: Left conjunctiva is not injected.     Pupils: Pupils are equal, round, and reactive to light.  Neck:     Thyroid: No thyromegaly.     Vascular: No JVD.     Trachea: No tracheal deviation.  Cardiovascular:     Rate and Rhythm: Normal rate and regular rhythm.     Heart sounds: Normal heart sounds, S1 normal and S2 normal. No murmur heard.  No systolic murmur is present.  No diastolic murmur is present. No friction rub. No gallop. No S3 or S4 sounds.    Pulmonary:     Effort: Pulmonary effort is normal. No respiratory distress.     Breath sounds: Normal breath sounds. No wheezing, rhonchi or rales.  Chest:  Breasts:     Right: Normal. No swelling, bleeding, inverted nipple, mass, nipple discharge, skin change, tenderness, axillary adenopathy or supraclavicular adenopathy.     Left: Normal. No swelling, bleeding, inverted nipple, mass, nipple discharge, skin change, tenderness, axillary adenopathy or supraclavicular adenopathy.    Abdominal:     General: Bowel sounds are normal.     Palpations: Abdomen is soft. There is no mass.     Tenderness: There is no abdominal tenderness. There is no guarding or rebound.  Musculoskeletal:        General: No tenderness. Normal range of motion.     Cervical back: Normal range of motion and neck supple.     Right lower leg: No edema.     Left lower leg: No edema.  Lymphadenopathy:     Cervical: No cervical adenopathy.     Upper Body:     Right upper body: No supraclavicular or axillary adenopathy.     Left upper body: No supraclavicular or axillary adenopathy.  Skin:    General: Skin is warm.     Findings: No rash.  Neurological:     Mental Status: She is alert and oriented to person, place, and time.     Cranial Nerves: No cranial nerve deficit.     Deep Tendon Reflexes: Reflexes normal.  Psychiatric:        Mood and Affect: Mood is not anxious or depressed.     Wt Readings from Last 3 Encounters:  04/23/21 213 lb (96.6 kg)  11/06/20 210 lb (95.3 kg)  08/06/20 216 lb 9.6 oz (98.2 kg)    BP 120/80   Pulse 68   Ht '5\' 1"'  (1.549 m)   Wt 213 lb (96.6 kg)   BMI 40.25 kg/m   Assessment and Plan:  1. Essential hypertension Chronic.  Controlled.  Stable.  Blood pressure today is 120/80.  We will continue hydrochlorothiazide 25 mg and lisinopril 10 mg once a day. - hydrochlorothiazide (HYDRODIURIL) 25 MG tablet; 1 tablet daily  Dispense: 90 tablet; Refill: 1 - lisinopril (  ZESTRIL) 10  MG tablet; Take 1 tablet (10 mg total) by mouth daily.  Dispense: 90 tablet; Refill: 1  2. Familial hypercholesterolemia Chronic.  Uncontrolled.  Stable.  Will check lipid panel.  Add proceed with adding a low-cholesterol diet guidelines. - Lipid Panel With LDL/HDL Ratio  3. Abnormal weight gain Health risks of being over weight were discussed and patient was counseled on weight loss options and exercise.BMI over 40.  There has been a 3 pound weight gain since her last visit..  Reemphasized weight loss and low-cholesterol diet has been given. - Lipid Panel With LDL/HDL Ratio  4. Tobacco dependence Patient has been advised of the health risks of smoking and counseled concerning cessation of tobacco products. I spent over 3 minutes for discussion and to answer questions.  5. Breast cancer screening by mammogram Discussed with patient.  Breast exam was done with no palpable mass or abnormality.

## 2021-04-24 LAB — LIPID PANEL WITH LDL/HDL RATIO
Cholesterol, Total: 221 mg/dL — ABNORMAL HIGH (ref 100–199)
HDL: 56 mg/dL (ref >39)
LDL Chol Calc (NIH): 145 mg/dL — ABNORMAL HIGH (ref 0–99)
LDL/HDL Ratio: 2.6 ratio (ref 0.0–3.2)
Triglycerides: 113 mg/dL (ref 0–149)
VLDL Cholesterol Cal: 20 mg/dL (ref 5–40)

## 2021-04-26 ENCOUNTER — Other Ambulatory Visit: Payer: Self-pay

## 2021-04-26 ENCOUNTER — Telehealth: Payer: Self-pay

## 2021-04-26 DIAGNOSIS — E7801 Familial hypercholesterolemia: Secondary | ICD-10-CM

## 2021-04-26 MED ORDER — ATORVASTATIN CALCIUM 10 MG PO TABS: 10.0000 mg | ORAL_TABLET | Freq: Every day | ORAL | 1 refills | Status: DC

## 2021-04-26 NOTE — Progress Notes (Signed)
Sent in Atorv to [facility]- recheck lipid and hepatic in 6 weeks

## 2021-04-26 NOTE — Telephone Encounter (Signed)
Copied from [place] [phone]. Topic: General - Other >> Apr 26, 2021  3:26 PM Celene Kras wrote: Reason for CRM: Pt called to speak with Baxter Flattery stating that she would like to have the statin prescribed for her. Please advise.     [id number] [address], [place] AT [facility] OF [address] [place] [address] Phone: [phone] Fax: [phone] Hours: Not open 24 hours

## 2021-04-29 ENCOUNTER — Inpatient Hospital Stay: Admission: RE | Admit: 2021-04-29 | Payer: PPO | Source: Ambulatory Visit

## 2021-05-01 ENCOUNTER — Other Ambulatory Visit: Payer: Self-pay

## 2021-05-01 ENCOUNTER — Ambulatory Visit
Admission: RE | Admit: 2021-05-01 | Discharge: 2021-05-01 | Disposition: A | Discharge status: Home / Self Care | Payer: PPO | Source: Ambulatory Visit | Attending: Family Medicine | Admitting: Family Medicine

## 2021-05-01 DIAGNOSIS — Z1231 Encounter for screening mammogram for malignant neoplasm of breast: Secondary | ICD-10-CM | POA: Insufficient documentation

## 2021-05-02 ENCOUNTER — Inpatient Hospital Stay
Admission: RE | Admit: 2021-05-02 | Discharge: 2021-05-02 | Disposition: A | Discharge status: Home / Self Care | Payer: Self-pay | Source: Ambulatory Visit | Attending: *Deleted | Admitting: *Deleted

## 2021-05-02 ENCOUNTER — Other Ambulatory Visit: Payer: Self-pay | Admitting: *Deleted

## 2021-05-02 DIAGNOSIS — Z1231 Encounter for screening mammogram for malignant neoplasm of breast: Secondary | ICD-10-CM

## 2021-05-22 DIAGNOSIS — M5116 Intervertebral disc disorders with radiculopathy, lumbar region: Secondary | ICD-10-CM | POA: Diagnosis not present

## 2021-05-22 DIAGNOSIS — M4716 Other spondylosis with myelopathy, lumbar region: Secondary | ICD-10-CM | POA: Diagnosis not present

## 2021-05-22 DIAGNOSIS — Z79891 Long term (current) use of opiate analgesic: Secondary | ICD-10-CM | POA: Diagnosis not present

## 2021-05-22 DIAGNOSIS — Z79899 Other long term (current) drug therapy: Secondary | ICD-10-CM | POA: Diagnosis not present

## 2021-05-22 DIAGNOSIS — M25561 Pain in right knee: Secondary | ICD-10-CM | POA: Diagnosis not present

## 2021-05-22 DIAGNOSIS — M25562 Pain in left knee: Secondary | ICD-10-CM | POA: Diagnosis not present

## 2021-05-22 DIAGNOSIS — G894 Chronic pain syndrome: Secondary | ICD-10-CM | POA: Diagnosis not present

## 2021-06-10 ENCOUNTER — Other Ambulatory Visit: Payer: Self-pay | Admitting: Family Medicine

## 2021-06-10 DIAGNOSIS — I10 Essential (primary) hypertension: Secondary | ICD-10-CM

## 2021-07-03 ENCOUNTER — Other Ambulatory Visit: Payer: Self-pay

## 2021-07-03 DIAGNOSIS — E7801 Familial hypercholesterolemia: Secondary | ICD-10-CM

## 2021-07-04 ENCOUNTER — Other Ambulatory Visit: Payer: Self-pay | Admitting: Family Medicine

## 2021-07-04 DIAGNOSIS — E7801 Familial hypercholesterolemia: Secondary | ICD-10-CM

## 2021-07-04 NOTE — Telephone Encounter (Signed)
Future visit in 3 months  

## 2021-07-15 DIAGNOSIS — E7801 Familial hypercholesterolemia: Secondary | ICD-10-CM | POA: Diagnosis not present

## 2021-07-16 LAB — LIPID PANEL
Chol/HDL Ratio: 3.3 ratio (ref 0.0–4.4)
Cholesterol, Total: 169 mg/dL (ref 100–199)
HDL: 52 mg/dL (ref >39)
LDL Chol Calc (NIH): 95 mg/dL (ref 0–99)
Triglycerides: 121 mg/dL (ref 0–149)
VLDL Cholesterol Cal: 22 mg/dL (ref 5–40)

## 2021-08-07 ENCOUNTER — Other Ambulatory Visit: Payer: Self-pay

## 2021-08-07 ENCOUNTER — Ambulatory Visit (INDEPENDENT_AMBULATORY_CARE_PROVIDER_SITE_OTHER): Payer: PPO

## 2021-08-07 VITALS — BP 100/68 | HR 93 | Temp 98.7°F | Resp 15 | Ht 61.0 in | Wt 204.6 lb

## 2021-08-07 DIAGNOSIS — Z0000 Encounter for general adult medical examination without abnormal findings: Secondary | ICD-10-CM

## 2021-08-07 DIAGNOSIS — Z23 Encounter for immunization: Secondary | ICD-10-CM

## 2021-08-07 NOTE — Patient Instructions (Signed)
Erica Ruiz , Thank you for taking time to come for your Medicare Wellness Visit. I appreciate your ongoing commitment to your health goals. Please review the following plan we discussed and let me know if I can assist you in the future.   Screening recommendations/referrals: Colonoscopy: done 08/23/18. Repeat 08/2023 Mammogram: done 05/02/21 Bone Density: due age 60 Recommended yearly ophthalmology/optometry visit for glaucoma screening and checkup Recommended yearly dental visit for hygiene and checkup  Vaccinations: Influenza vaccine: done 08/2020 Pneumococcal vaccine: done today Tdap vaccine: done 08/04/18 Shingles vaccine: Shingrix discussed. Please contact your pharmacy for coverage information.  Covid-19: declined  Advanced directives: Please bring a copy of your health care power of attorney and living will to the office at your convenience once you have completed that paperwork.   Conditions/risks identified: Keep up the great work!  Next appointment: Follow up in one year for your annual wellness visit.   Preventive Care 40-64 Years, Female Preventive care refers to lifestyle choices and visits with your health care provider that can promote health and wellness. What does preventive care include? A yearly physical exam. This is also called an annual well check. Dental exams once or twice a year. Routine eye exams. Ask your health care provider how often you should have your eyes checked. Personal lifestyle choices, including: Daily care of your teeth and gums. Regular physical activity. Eating a healthy diet. Avoiding tobacco and drug use. Limiting alcohol use. Practicing safe sex. Taking low-dose aspirin daily starting at age 46. Taking vitamin and mineral supplements as recommended by your health care provider. What happens during an annual well check? The services and screenings done by your health care provider during your annual well check will depend on your age,  overall health, lifestyle risk factors, and family history of disease. Counseling  Your health care provider may ask you questions about your: Alcohol use. Tobacco use. Drug use. Emotional well-being. Home and relationship well-being. Sexual activity. Eating habits. Work and work Statistician. Method of birth control. Menstrual cycle. Pregnancy history. Screening  You may have the following tests or measurements: Height, weight, and BMI. Blood pressure. Lipid and cholesterol levels. These may be checked every 5 years, or more frequently if you are over 48 years old. Skin check. Lung cancer screening. You may have this screening every year starting at age 2 if you have a 30-pack-year history of smoking and currently smoke or have quit within the past 15 years. Fecal occult blood test (FOBT) of the stool. You may have this test every year starting at age 23. Flexible sigmoidoscopy or colonoscopy. You may have a sigmoidoscopy every 5 years or a colonoscopy every 10 years starting at age 67. Hepatitis C blood test. Hepatitis B blood test. Sexually transmitted disease (STD) testing. Diabetes screening. This is done by checking your blood sugar (glucose) after you have not eaten for a while (fasting). You may have this done every 1-3 years. Mammogram. This may be done every 1-2 years. Talk to your health care provider about when you should start having regular mammograms. This may depend on whether you have a family history of breast cancer. BRCA-related cancer screening. This may be done if you have a family history of breast, ovarian, tubal, or peritoneal cancers. Pelvic exam and Pap test. This may be done every 3 years starting at age 65. Starting at age 47, this may be done every 5 years if you have a Pap test in combination with an HPV test. Bone density scan.  This is done to screen for osteoporosis. You may have this scan if you are at high risk for osteoporosis. Discuss your test  results, treatment options, and if necessary, the need for more tests with your health care provider. Vaccines  Your health care provider may recommend certain vaccines, such as: Influenza vaccine. This is recommended every year. Tetanus, diphtheria, and acellular pertussis (Tdap, Td) vaccine. You may need a Td booster every 10 years. Zoster vaccine. You may need this after age 15. Pneumococcal 13-valent conjugate (PCV13) vaccine. You may need this if you have certain conditions and were not previously vaccinated. Pneumococcal polysaccharide (PPSV23) vaccine. You may need one or two doses if you smoke cigarettes or if you have certain conditions. Talk to your health care provider about which screenings and vaccines you need and how often you need them. This information is not intended to replace advice given to you by your health care provider. Make sure you discuss any questions you have with your health care provider. Document Released: 12/28/2015 Document Revised: 08/20/2016 Document Reviewed: 10/02/2015 Elsevier Interactive Patient Education  2017 [place] Prevention in the Home Falls can cause injuries. They can happen to people of all ages. There are many things you can do to make your home safe and to help prevent falls. What can I do on the outside of my home? Regularly fix the edges of walkways and driveways and fix any cracks. Remove anything that might make you trip as you walk through a door, such as a raised step or threshold. Trim any bushes or trees on the path to your home. Use bright outdoor lighting. Clear any walking paths of anything that might make someone trip, such as rocks or tools. Regularly check to see if handrails are loose or broken. Make sure that both sides of any steps have handrails. Any raised decks and porches should have guardrails on the edges. Have any leaves, snow, or ice cleared regularly. Use sand or salt on walking paths during  winter. Clean up any spills in your garage right away. This includes oil or grease spills. What can I do in the bathroom? Use night lights. Install grab bars by the toilet and in the tub and shower. Do not use towel bars as grab bars. Use non-skid mats or decals in the tub or shower. If you need to sit down in the shower, use a plastic, non-slip stool. Keep the floor dry. Clean up any water that spills on the floor as soon as it happens. Remove soap buildup in the tub or shower regularly. Attach bath mats securely with double-sided non-slip rug tape. Do not have throw rugs and other things on the floor that can make you trip. What can I do in the bedroom? Use night lights. Make sure that you have a light by your bed that is easy to reach. Do not use any sheets or blankets that are too big for your bed. They should not hang down onto the floor. Have a firm chair that has side arms. You can use this for support while you get dressed. Do not have throw rugs and other things on the floor that can make you trip. What can I do in the kitchen? Clean up any spills right away. Avoid walking on wet floors. Keep items that you use a lot in easy-to-reach places. If you need to reach something above you, use a strong step stool that has a grab bar. Keep electrical cords  out of the way. Do not use floor polish or wax that makes floors slippery. If you must use wax, use non-skid floor wax. Do not have throw rugs and other things on the floor that can make you trip. What can I do with my stairs? Do not leave any items on the stairs. Make sure that there are handrails on both sides of the stairs and use them. Fix handrails that are broken or loose. Make sure that handrails are as long as the stairways. Check any carpeting to make sure that it is firmly attached to the stairs. Fix any carpet that is loose or worn. Avoid having throw rugs at the top or bottom of the stairs. If you do have throw rugs,  attach them to the floor with carpet tape. Make sure that you have a light switch at the top of the stairs and the bottom of the stairs. If you do not have them, ask someone to add them for you. What else can I do to help prevent falls? Wear shoes that: Do not have high heels. Have rubber bottoms. Are comfortable and fit you well. Are closed at the toe. Do not wear sandals. If you use a stepladder: Make sure that it is fully opened. Do not climb a closed stepladder. Make sure that both sides of the stepladder are locked into place. Ask someone to hold it for you, if possible. Clearly mark and make sure that you can see: Any grab bars or handrails. First and last steps. Where the edge of each step is. Use tools that help you move around (mobility aids) if they are needed. These include: Canes. Walkers. Scooters. Crutches. Turn on the lights when you go into a dark area. Replace any light bulbs as soon as they burn out. Set up your furniture so you have a clear path. Avoid moving your furniture around. If any of your floors are uneven, fix them. If there are any pets around you, be aware of where they are. Review your medicines with your doctor. Some medicines can make you feel dizzy. This can increase your chance of falling. Ask your doctor what other things that you can do to help prevent falls. This information is not intended to replace advice given to you by your health care provider. Make sure you discuss any questions you have with your health care provider. Document Released: 09/27/2009 Document Revised: 05/08/2016 Document Reviewed: 01/05/2015 Elsevier Interactive Patient Education  2017 [employer].

## 2021-08-07 NOTE — Progress Notes (Signed)
Subjective:   Erica Ruiz is a 60 y.o. female who presents for Medicare Annual (Subsequent) preventive examination.  Review of Systems     Cardiac Risk Factors include: advanced age (>75mn, >>38women);dyslipidemia;diabetes mellitus;obesity (BMI >30kg/m2);hypertension;smoking/ tobacco exposure     Objective:    Today's Vitals   08/07/21 1519 08/07/21 1520  BP: 100/68   Pulse: 93   Resp: 15   Temp: 98.7 F (37.1 C)   TempSrc: Oral   SpO2: 96%   Weight: 204 lb 9.6 oz (92.8 kg)   Height: '5\' 1"'  (1.549 m)   PainSc:  5    Body mass index is 38.66 kg/m.  Advanced Directives 08/07/2021 08/06/2020 08/03/2019 08/23/2018 07/28/2018 06/28/2018  Does Patient Have a Medical Advance Directive? No No No No No No  Would patient like information on creating a medical advance directive? No - Patient declined No - Patient declined Yes (MAU/Ambulatory/Procedural Areas - Information given) No - Patient declined Yes (MAU/Ambulatory/Procedural Areas - Information given) No - Patient declined    Current Medications (verified) Outpatient Encounter Medications as of 08/07/2021  Medication Sig   atorvastatin (LIPITOR) 10 MG tablet TAKE 1 TABLET(10 MG) BY MOUTH DAILY   DULoxetine (CYMBALTA) 30 MG capsule TK ONE C PO ONCE A DAY FOR 90 DAYS- pain management   empagliflozin (JARDIANCE) 25 MG TABS tablet Take 25 mg by mouth daily. Hilary   fluticasone (FLONASE) 50 MCG/ACT nasal spray Place 2 sprays into both nostrils daily.   gabapentin (NEURONTIN) 600 MG tablet TK 1 T PO BID- pain management   glucose blood test strip 1 each by Other route 2 (two) times daily.   hydrochlorothiazide (HYDRODIURIL) 25 MG tablet TAKE 1 TABLET BY MOUTH ONCE DAILY   Lancets (ONETOUCH ULTRASOFT) lancets 1 each by Other route 2 (two) times daily.   lisinopril (ZESTRIL) 10 MG tablet Take 1 tablet (10 mg total) by mouth daily.   metFORMIN (GLUCOPHAGE-XR) 500 MG 24 hr tablet Take 1,000 mg by mouth 2 (two) times daily. Hilary    morphine (MS CONTIN) 15 MG 12 hr tablet TK 1 T PO  Q 8 H FOR 30 DAYS- pain management   naproxen sodium (ALEVE) 220 MG tablet Take 220 mg by mouth every 12 (twelve) hours as needed.   Oxycodone HCl 10 MG TABS Take 10 mg by mouth 4 (four) times daily as needed. Pain clinic   tiZANidine (ZANAFLEX) 4 MG tablet TK 1 T PO Q 8 H PRF SPASMS- pain management   NARCAN 4 MG/0.1ML LIQD nasal spray kit NAR REP ALN   No facility-administered encounter medications on file as of 08/07/2021.    Allergies (verified) Patient has no known allergies.   History: Past Medical History:  Diagnosis Date   Back pain    [place] mellitus [place]    Hypertension    Past Surgical History:  Procedure Laterality Date   BREAST BIOPSY Right    neg bx/clip   CHOLECYSTECTOMY     COLON SURGERY     COLONOSCOPY WITH PROPOFOL N/A 08/23/2018   Procedure: COLONOSCOPY WITH PROPOFOL with biopsies;  Surgeon: WLucilla Lame MD;  [place] [facility]  Service: Endoscopy;  Laterality: N/A;  DIABETIC   COLOSTOMY REVERSAL     KNEE SURGERY     POLYPECTOMY N/A 08/23/2018   Procedure: POLYPECTOMY;  Surgeon: WLucilla Lame MD;  [place] [facility]  Service: Endoscopy;  Laterality: N/A;   TONSILLECTOMY     TUBAL LIGATION  Family History  Problem Relation Age of Onset   Pulmonary embolism Mother    Cancer Father        prostate   Heart disease Father    Hypertension Father    Cancer Sister        non-hodgkin's lymphoma   Hepatitis C Sister    Cancer Brother        colon   Diabetes Brother    Heart disease Brother    Hypertension Brother    Diabetes Daughter    Diabetes Brother    Hypertension Sister    Cancer Brother        prostate   Breast cancer Paternal Aunt    Social History   Socioeconomic History   Marital status: Married    Spouse name: Not on file   Number of children: 4   Years of education: Not on file   Highest education level: 12th grade  Occupational History    Occupation: Disabled  Tobacco Use   Smoking status: Every Day    Packs/day: 0.50    Years: 25.00    Pack years: 12.50    Types: Cigarettes   Smokeless tobacco: Never  Vaping Use   Vaping Use: Never used  Substance and Sexual Activity   Alcohol use: Never   Drug use: Never   Sexual activity: Not Currently    Birth control/protection: Spermicide  Other Topics Concern   Not on file  Social History Narrative   Not on file   Social Determinants of Health   Financial Resource Strain: Low Risk    Difficulty of Paying Living Expenses: Not hard at all  Food Insecurity: No Food Insecurity   Worried About Charity fundraiser in the Last Year: Never true   Ran Out of Food in the Last Year: Never true  Transportation Needs: No Transportation Needs   Lack of Transportation (Medical): No   Lack of Transportation (Non-Medical): No  Physical Activity: Insufficiently Active   Days of Exercise per Week: 7 days   Minutes of Exercise per Session: 20 min  Stress: No Stress Concern Present   Feeling of Stress : Not at all  Social Connections: Moderately Integrated   Frequency of Communication with Friends and Family: More than three times a week   Frequency of Social Gatherings with Friends and Family: Three times a week   Attends Religious Services: More than 4 times per year   Active Member of Clubs or Organizations: No   Attends Archivist Meetings: Never   Marital Status: Married    Tobacco Counseling Ready to quit: No Counseling given: Not Answered   Clinical Intake:  Pre-visit preparation completed: Yes  Pain : 0-10 Pain Score: 5  Pain Type: Chronic pain Pain Location: Back Pain Orientation: Lower Pain Descriptors / Indicators: Aching, Sore Pain Onset: More than a month ago Pain Frequency: Constant     BMI - recorded: 38.66 Nutritional Status: BMI > 30  Obese Nutritional Risks: None Diabetes: Yes CBG done?: No Did pt. bring in CBG monitor from home?:  No  How often do you need to have someone help you when you read instructions, pamphlets, or other written materials from your doctor or pharmacy?: 1 - Never  Nutrition Risk Assessment:  Has the patient had any N/V/D within the last 2 months?  No  Does the patient have any non-healing wounds?  No  Has the patient had any unintentional weight loss or weight gain?  No  Diabetes:  Is the patient diabetic?  Yes  If diabetic, was a CBG obtained today?  No  Did the patient bring in their glucometer from home?  No  How often do you monitor your CBG's? Twice daily.   Financial Strains and Diabetes Management:  Are you having any financial strains with the device, your supplies or your medication? No .  Does the patient want to be seen by Chronic Care Management for management of their diabetes?  No  Would the patient like to be referred to a Nutritionist or for Diabetic Management?  No   Diabetic Exams:  Diabetic Eye Exam: Completed 06/12/20. Overdue for diabetic eye exam. Pt has been advised about the importance in completing this exam. Pt scheduled to see Dr. Ellin Mayhew 08/2021  Diabetic Foot Exam: Completed 04/08/21.   Interpreter Needed?: No  Information entered by :: Clemetine Marker LPN   Activities of Daily Living In your present state of health, do you have any difficulty performing the following activities: 08/07/2021  Hearing? N  Vision? N  Difficulty concentrating or making decisions? N  Walking or climbing stairs? Y  Dressing or bathing? N  Doing errands, shopping? N  Preparing Food and eating ? N  Using the Toilet? N  In the past six months, have you accidently leaked urine? Y  Comment wears pads for protection  Do you have problems with loss of bowel control? N  Managing your Medications? N  Managing your Finances? N  Housekeeping or managing your Housekeeping? N  Some recent data might be hidden    Patient Care Team: Juline Patch, MD as PCP - General (Family  Medicine) Warnell Forester, NP as Nurse Practitioner (Surgery)  Indicate any recent Medical Services you may have received from other than [facility] providers in the past year (date may be approximate).     Assessment:   This is a routine wellness examination for [place].  Hearing/Vision screen Hearing Screening - Comments::  Pt denies hearing difficulty Vision Screening - Comments:: Annual vision screenings done at [facility] issues and exercise activities discussed: Current Exercise Habits: Home exercise routine, Type of exercise: walking, Time (Minutes): 20, Frequency (Times/Week): 7, Weekly Exercise (Minutes/Week): 140, Intensity: Mild, Exercise limited by: orthopedic condition(s)   Goals Addressed             This Visit's Progress    DIET - INCREASE WATER INTAKE   On track    Recommend to drink at least 6-8 8oz glasses of water per day.     Quit Smoking   Not on track    Recommend to enroll in smoking cessation classes or discuss possibility of smoking cessation alternatives with provider       Depression Screen PHQ 2/9 Scores 08/07/2021 04/23/2021 11/06/2020 08/06/2020 05/04/2020 08/03/2019 10/14/2018  PHQ - 2 Score 0 0 0 0 0 0 0  PHQ- 9 Score - 0 0 - 0 - 1    Fall Risk Fall Risk  08/07/2021 11/06/2020 08/06/2020 05/04/2020 08/03/2019  Falls in the past year? 1 0 '1 1 1  ' Number falls in past yr: 1 - '1 1 1  ' Injury with Fall? 0 - 0 0 0  Risk for fall due to : History of fall(s);Impaired balance/gait - History of fall(s);Impaired balance/gait;Orthopedic patient - History of fall(s);Impaired balance/gait  Risk for fall due to: Comment - - - - -  Follow up Falls prevention discussed Falls evaluation completed Falls prevention discussed Falls evaluation completed Falls prevention  discussed    FALL RISK PREVENTION PERTAINING TO THE HOME:  Any stairs in or around the home? Yes  If so, are there any without handrails? No  Home free of loose throw rugs in walkways, pet  beds, electrical cords, etc? Yes  Adequate lighting in your home to reduce risk of falls? Yes   ASSISTIVE DEVICES UTILIZED TO PREVENT FALLS:  Life alert? No  Use of a cane, walker or w/c? Yes  Grab bars in the bathroom? Yes  Shower chair or bench in shower? Yes  Elevated toilet seat or a handicapped toilet? No   TIMED UP AND GO:  Was the test performed? Yes .  Length of time to ambulate 10 feet: 6 sec.   Gait slow and steady without use of assistive device  Cognitive Function: Normal cognitive status assessed by direct observation by this Nurse Health Advisor. No abnormalities found.       6CIT Screen 08/06/2020 08/03/2019 07/28/2018  What Year? 0 points 0 points 0 points  What month? 0 points 0 points 0 points  What time? 0 points 0 points 0 points  Count back from 20 0 points 0 points 0 points  Months in reverse 0 points 0 points 4 points  Repeat phrase 0 points 0 points 6 points  Total Score 0 0 10    Immunizations Immunization History  Administered Date(s) Administered   Influenza Inj Mdck Quad With Preservative 12/17/2017   Influenza,inj,Quad PF,6+ Mos 10/14/2018   Influenza-Unspecified 08/29/2020   PNEUMOCOCCAL CONJUGATE-20 08/07/2021   Tdap 08/04/2018    TDAP status: Up to date  Flu Vaccine status: Up to date  Pneumococcal vaccine status: Completed during today's visit.  Covid-19 vaccine status: Declined, Education has been provided regarding the importance of this vaccine but patient still declined. Advised may receive this vaccine at local pharmacy or Health Dept.or vaccine clinic. Aware to provide a copy of the vaccination record if obtained from local pharmacy or Health Dept. Verbalized acceptance and understanding.  Qualifies for Shingles Vaccine? Yes   Zostavax completed No   Shingrix Completed?: No.    Education has been provided regarding the importance of this vaccine. Patient has been advised to call insurance company to determine out of pocket  expense if they have not yet received this vaccine. Advised may also receive vaccine at local pharmacy or Health Dept. Verbalized acceptance and understanding.  Screening Tests Health Maintenance  Topic Date Due   Pneumococcal Vaccine 53-69 Years old (1 - PCV) Never done   Zoster Vaccines- Shingrix (1 of 2) Never done   OPHTHALMOLOGY EXAM  06/12/2021   INFLUENZA VACCINE  07/15/2021   PAP SMEAR-Modifier  08/05/2021   PNEUMOCOCCAL POLYSACCHARIDE VACCINE AGE 37-64 HIGH RISK  09/04/2028 (Originally 09/04/1963)   HEMOGLOBIN A1C  10/08/2021   FOOT EXAM  04/08/2022   MAMMOGRAM  05/01/2022   COLONOSCOPY (Pts 45-15yr Insurance coverage will need to be confirmed)  08/24/2023   TETANUS/TDAP  08/04/2028   Hepatitis C Screening  Completed   HPV VACCINES  Aged Out   COVID-19 Vaccine  Discontinued   HIV Screening  Discontinued    Health Maintenance  Health Maintenance Due  Topic Date Due   Pneumococcal Vaccine 040611Years old (1 - PCV) Never done   Zoster Vaccines- Shingrix (1 of 2) Never done   OPHTHALMOLOGY EXAM  06/12/2021   INFLUENZA VACCINE  07/15/2021   PAP SMEAR-Modifier  08/05/2021    Colorectal cancer screening: Type of screening: Colonoscopy. Completed 08/23/18. Repeat every  5 years  Mammogram status: Completed 05/02/21. Repeat every year  Bone density status: due age 60  Lung Cancer Screening: (Low Dose CT Chest recommended if Age 70-80 years, 30 pack-year currently smoking OR have quit w/in 15years.) does not qualify.    Additional Screening:  Hepatitis C Screening: does qualify; Completed 08/04/18  Vision Screening: Recommended annual ophthalmology exams for early detection of glaucoma and other disorders of the eye. Is the patient up to date with their annual eye exam?  No  Who is the provider or what is the name of the office in which the patient attends annual eye exams? Dr. Ellin Mayhew.   Dental Screening: Recommended annual dental exams for proper oral hygiene  Community  Resource Referral / Chronic Care Management: CRR required this visit?  No   CCM required this visit?  No      Plan:     I have personally reviewed and noted the following in the patient's chart:   Medical and social history Use of alcohol, tobacco or illicit drugs  Current medications and supplements including opioid prescriptions.  Functional ability and status Nutritional status Physical activity Advanced directives List of other physicians Hospitalizations, surgeries, and ER visits in previous 12 months Vitals Screenings to include cognitive, depression, and falls Referrals and appointments  In addition, I have reviewed and discussed with patient certain preventive protocols, quality metrics, and best practice recommendations. A written personalized care plan for preventive services as well as general preventive health recommendations were provided to patient.     Clemetine Marker, LPN   [id number]   Nurse Notes: none

## 2021-08-13 ENCOUNTER — Other Ambulatory Visit: Payer: Self-pay | Admitting: Family Medicine

## 2021-08-13 DIAGNOSIS — I10 Essential (primary) hypertension: Secondary | ICD-10-CM

## 2021-08-13 NOTE — Telephone Encounter (Signed)
Requested medication (s) are due for refill today: Yes  Requested medication (s) are on the active medication list: Yes  Last refill:  04/23/21  Future visit scheduled: Yes  Notes to clinic:  Unable to refill per protocol due to failed labs, no updated results of potassium and creatinine     Requested Prescriptions  Pending Prescriptions Disp Refills   lisinopril (ZESTRIL) 10 MG tablet [Pharmacy Med Name: LISINOPRIL 10 MG TAB] 30 tablet     Sig: TAKE 1 TABLET BY MOUTH ONCE DAILY     Cardiovascular:  ACE Inhibitors Failed - 08/13/2021 11:59 AM      Failed - Cr in normal range and within 180 days    Creatinine  Date Value Ref Range Status  06/21/2020 0.7 0.5 - 1.1 Final   Creatinine, Ser  Date Value Ref Range Status  10/14/2018 0.73 0.57 - 1.00 mg/dL Final          Failed - K in normal range and within 180 days    Potassium  Date Value Ref Range Status  10/14/2018 4.3 3.5 - 5.2 mmol/L Final          Passed - Patient is not pregnant      Passed - Last BP in normal range    BP Readings from Last 1 Encounters:  08/07/21 100/68          Passed - Valid encounter within last 6 months    Recent Outpatient Visits           3 months ago Essential hypertension   [place], [place]   9 months ago Essential hypertension   [place], Deanna C, MD   1 year ago Essential hypertension   [place], Deanna C, MD   2 years ago Essential hypertension   [place], Deanna C, MD   2 years ago Essential hypertension   [place], Deanna C, MD       Future Appointments             In 1 month Juline Patch, MD [facility], [facility]

## 2021-08-20 DIAGNOSIS — H2513 Age-related nuclear cataract, bilateral: Secondary | ICD-10-CM | POA: Diagnosis not present

## 2021-08-20 DIAGNOSIS — H40013 Open angle with borderline findings, low risk, bilateral: Secondary | ICD-10-CM | POA: Diagnosis not present

## 2021-08-20 DIAGNOSIS — E119 Type 2 diabetes mellitus without complications: Secondary | ICD-10-CM | POA: Diagnosis not present

## 2021-08-28 DIAGNOSIS — M4716 Other spondylosis with myelopathy, lumbar region: Secondary | ICD-10-CM | POA: Diagnosis not present

## 2021-08-28 DIAGNOSIS — Z79899 Other long term (current) drug therapy: Secondary | ICD-10-CM | POA: Diagnosis not present

## 2021-08-28 DIAGNOSIS — M25562 Pain in left knee: Secondary | ICD-10-CM | POA: Diagnosis not present

## 2021-08-28 DIAGNOSIS — Z79891 Long term (current) use of opiate analgesic: Secondary | ICD-10-CM | POA: Diagnosis not present

## 2021-08-28 DIAGNOSIS — M25561 Pain in right knee: Secondary | ICD-10-CM | POA: Diagnosis not present

## 2021-08-28 DIAGNOSIS — M5116 Intervertebral disc disorders with radiculopathy, lumbar region: Secondary | ICD-10-CM | POA: Diagnosis not present

## 2021-08-28 DIAGNOSIS — G894 Chronic pain syndrome: Secondary | ICD-10-CM | POA: Diagnosis not present

## 2021-08-30 LAB — HM DIABETES EYE EXAM

## 2021-09-01 ENCOUNTER — Other Ambulatory Visit: Payer: Self-pay | Admitting: Family Medicine

## 2021-09-01 DIAGNOSIS — I10 Essential (primary) hypertension: Secondary | ICD-10-CM

## 2021-09-02 NOTE — Telephone Encounter (Signed)
Requested medication (s) are due for refill today: yes  Requested medication (s) are on the active medication list: yes  Last refill:  06/10/21 #90 1 refill   Future visit scheduled: yes in 1  month   Notes to clinic:  last labs 09/26/2018 . Do you want to refill for 1 month or give 3 month supply until next visit for labs?     Requested Prescriptions  Pending Prescriptions Disp Refills   hydrochlorothiazide (HYDRODIURIL) 25 MG tablet [Pharmacy Med Name: HYDROCHLOROTHIAZIDE 25 MG TAB] 90 tablet 1    Sig: TAKE 1 TABLET BY MOUTH ONCE DAILY     Cardiovascular: Diuretics - Thiazide Failed - 09/01/2021  6:50 PM      Failed - Ca in normal range and within 360 days    Calcium  Date Value Ref Range Status  10/14/2018 9.6 8.7 - 10.2 mg/dL Final          Failed - Cr in normal range and within 360 days    Creatinine  Date Value Ref Range Status  06/21/2020 0.7 0.5 - 1.1 Final   Creatinine, Ser  Date Value Ref Range Status  10/14/2018 0.73 0.57 - 1.00 mg/dL Final          Failed - K in normal range and within 360 days    Potassium  Date Value Ref Range Status  10/14/2018 4.3 3.5 - 5.2 mmol/L Final          Failed - Na in normal range and within 360 days    Sodium  Date Value Ref Range Status  10/14/2018 140 134 - 144 mmol/L Final          Passed - Last BP in normal range    BP Readings from Last 1 Encounters:  08/07/21 100/68          Passed - Valid encounter within last 6 months    Recent Outpatient Visits           4 months ago Essential hypertension   [place], Deanna C, MD   10 months ago Essential hypertension   [place], Deanna C, MD   1 year ago Essential hypertension   [place], Deanna C, MD   2 years ago Essential hypertension   [place], Deanna C, MD   2 years ago Essential hypertension   [place], Deanna C, MD       Future Appointments             In  1 month Juline Patch, MD [facility], [facility]

## 2021-10-01 ENCOUNTER — Other Ambulatory Visit: Payer: Self-pay | Admitting: Family Medicine

## 2021-10-01 DIAGNOSIS — E7801 Familial hypercholesterolemia: Secondary | ICD-10-CM

## 2021-10-02 MED ORDER — ATORVASTATIN CALCIUM 10 MG PO TABS: 10.0000 mg | ORAL_TABLET | Freq: Every day | ORAL | 0 refills | Status: DC

## 2021-10-09 DIAGNOSIS — R809 Proteinuria, unspecified: Secondary | ICD-10-CM | POA: Diagnosis not present

## 2021-10-09 DIAGNOSIS — E785 Hyperlipidemia, unspecified: Secondary | ICD-10-CM | POA: Diagnosis not present

## 2021-10-09 DIAGNOSIS — E1129 Type 2 diabetes mellitus with other diabetic kidney complication: Secondary | ICD-10-CM | POA: Diagnosis not present

## 2021-10-09 DIAGNOSIS — E1169 Type 2 diabetes mellitus with other specified complication: Secondary | ICD-10-CM | POA: Diagnosis not present

## 2021-10-09 DIAGNOSIS — E1159 Type 2 diabetes mellitus with other circulatory complications: Secondary | ICD-10-CM | POA: Diagnosis not present

## 2021-10-09 DIAGNOSIS — E1165 Type 2 diabetes mellitus with hyperglycemia: Secondary | ICD-10-CM | POA: Diagnosis not present

## 2021-10-09 DIAGNOSIS — I152 Hypertension secondary to endocrine disorders: Secondary | ICD-10-CM | POA: Diagnosis not present

## 2021-10-10 ENCOUNTER — Ambulatory Visit (INDEPENDENT_AMBULATORY_CARE_PROVIDER_SITE_OTHER): Payer: PPO | Admitting: Family Medicine

## 2021-10-10 ENCOUNTER — Other Ambulatory Visit: Payer: Self-pay

## 2021-10-10 ENCOUNTER — Encounter: Payer: Self-pay | Admitting: Family Medicine

## 2021-10-10 VITALS — BP 118/64 | HR 76 | Ht 61.0 in | Wt 204.0 lb

## 2021-10-10 DIAGNOSIS — E7801 Familial hypercholesterolemia: Secondary | ICD-10-CM

## 2021-10-10 DIAGNOSIS — Z23 Encounter for immunization: Secondary | ICD-10-CM

## 2021-10-10 DIAGNOSIS — I10 Essential (primary) hypertension: Secondary | ICD-10-CM | POA: Diagnosis not present

## 2021-10-10 MED ORDER — HYDROCHLOROTHIAZIDE 25 MG PO TABS: 25.0000 mg | ORAL_TABLET | Freq: Every day | ORAL | 1 refills | Status: DC

## 2021-10-10 MED ORDER — LISINOPRIL 10 MG PO TABS: 10.0000 mg | ORAL_TABLET | Freq: Every day | ORAL | 1 refills | Status: DC

## 2021-10-10 MED ORDER — ATORVASTATIN CALCIUM 10 MG PO TABS: 10.0000 mg | ORAL_TABLET | Freq: Every day | ORAL | 1 refills | Status: DC

## 2021-10-10 NOTE — Progress Notes (Signed)
Date:  10/10/2021   Name:  Erica Ruiz [facility]   DOB:  1960/12/31   MRN:  [id number]   Chief Complaint: Hyperlipidemia and Hypertension  Hyperlipidemia This is a chronic problem. The current episode started more than 1 year ago. The problem is controlled. Recent lipid tests were reviewed and are normal. Exacerbating diseases include obesity. She has no history of chronic renal disease, diabetes or nephrotic syndrome. Pertinent negatives include no chest pain or shortness of breath. The current treatment provides moderate improvement of lipids. There are no compliance problems.   Hypertension This is a chronic problem. The current episode started more than 1 year ago. The problem has been gradually improving since onset. The problem is controlled. Pertinent negatives include no blurred vision, chest pain, palpitations or shortness of breath. There are no associated agents to hypertension. Risk factors for coronary artery disease include post-menopausal state and dyslipidemia. Past treatments include ACE inhibitors and diuretics. The current treatment provides moderate improvement. There is no history of angina, kidney disease, CAD/MI, CVA, heart failure, left ventricular hypertrophy, PVD or retinopathy. There is no history of chronic renal disease, a hypertension causing med or renovascular disease.   Lab Results  Component Value Date   CREATININE 0.7 06/21/2020   BUN 14 06/21/2020   NA 140 10/14/2018   K 4.3 10/14/2018   CL 101 10/14/2018   CO2 23 10/14/2018   Lab Results  Component Value Date   CHOL 169 07/15/2021   HDL 52 07/15/2021   LDLCALC 95 07/15/2021   TRIG 121 07/15/2021   CHOLHDL 3.3 07/15/2021   Lab Results  Component Value Date   TSH 0.852 04/16/2018   Lab Results  Component Value Date   HGBA1C 6.2 04/08/2021   Lab Results  Component Value Date   WBC 7.4 04/05/2018   HGB 14.3 04/05/2018   HCT 41.6 04/05/2018   MCV 87.3 04/05/2018   PLT 178 04/05/2018   Lab  Results  Component Value Date   ALT 19 06/21/2020   AST 16 06/21/2020     Review of Systems  Eyes:  Negative for blurred vision.  Respiratory:  Negative for shortness of breath.   Cardiovascular:  Negative for chest pain and palpitations.   Patient Active Problem List   Diagnosis Date Noted   Type 2 diabetes mellitus with hyperglycemia, without long-term current use of insulin ([place]) 09/17/2018   Tobacco dependence 09/17/2018   Special screening for malignant neoplasms, colon    Benign neoplasm of transverse colon    Polyp of sigmoid colon    Rectal polyp    Hyperglycemia 04/16/2018   Essential hypertension 04/16/2018   Venous insufficiency 04/16/2018   Abnormal weight gain 04/16/2018    No Known Allergies  Past Surgical History:  Procedure Laterality Date   BREAST BIOPSY Right    neg bx/clip   CHOLECYSTECTOMY     COLON SURGERY     COLONOSCOPY WITH PROPOFOL N/A 08/23/2018   Procedure: COLONOSCOPY WITH PROPOFOL with biopsies;  Surgeon: Lucilla Lame, MD;  Location: [place];  Service: Endoscopy;  Laterality: N/A;  DIABETIC   COLOSTOMY REVERSAL     KNEE SURGERY     POLYPECTOMY N/A 08/23/2018   Procedure: POLYPECTOMY;  Surgeon: Lucilla Lame, MD;  Location: [place];  Service: Endoscopy;  Laterality: N/A;   TONSILLECTOMY     TUBAL LIGATION      Social History   Tobacco Use   Smoking status: Every Day    Packs/day: 0.50  Years: 25.00    Pack years: 12.50    Types: Cigarettes   Smokeless tobacco: Never  Vaping Use   Vaping Use: Never used  Substance Use Topics   Alcohol use: Never   Drug use: Never     Medication list has been reviewed and updated.  Current Meds  Medication Sig   atorvastatin (LIPITOR) 10 MG tablet Take 1 tablet (10 mg total) by mouth daily.   DULoxetine (CYMBALTA) 30 MG capsule TK ONE C PO ONCE A DAY FOR 90 DAYS- pain management   empagliflozin (JARDIANCE) 25 MG TABS tablet Take 25 mg by mouth daily. [place]    fluticasone (FLONASE) 50 MCG/ACT nasal spray Place 2 sprays into both nostrils daily.   gabapentin (NEURONTIN) 600 MG tablet TK 1 T PO BID- pain management   glucose blood test strip 1 each by Other route 2 (two) times daily.   hydrochlorothiazide (HYDRODIURIL) 25 MG tablet TAKE 1 TABLET BY MOUTH ONCE DAILY   Lancets (ONETOUCH ULTRASOFT) lancets 1 each by Other route 2 (two) times daily.   lisinopril (ZESTRIL) 10 MG tablet TAKE 1 TABLET BY MOUTH ONCE DAILY   metFORMIN (GLUCOPHAGE-XR) 500 MG 24 hr tablet Take 1,000 mg by mouth 2 (two) times daily. [place]   morphine (MS CONTIN) 15 MG 12 hr tablet TK 1 T PO  Q 8 H FOR 30 DAYS- pain management   naproxen sodium (ALEVE) 220 MG tablet Take 220 mg by mouth every 12 (twelve) hours as needed.   NARCAN 4 MG/0.1ML LIQD nasal spray kit NAR REP ALN   Oxycodone HCl 10 MG TABS Take 10 mg by mouth 4 (four) times daily as needed. Pain clinic   tiZANidine (ZANAFLEX) 4 MG tablet TK 1 T PO Q 8 H PRF SPASMS- pain management    PHQ 2/9 Scores 10/10/2021 08/07/2021 04/23/2021 11/06/2020  PHQ - 2 Score 0 0 0 0  PHQ- 9 Score 0 - 0 0    GAD 7 : Generalized Anxiety Score 10/10/2021 04/23/2021 11/06/2020 05/04/2020  Nervous, Anxious, on Edge 0 0 0 0  Control/stop worrying 0 0 0 0  Worry too much - different things 0 0 0 0  Trouble relaxing 0 0 0 0  Restless 0 0 0 0  Easily annoyed or irritable 0 0 0 0  Afraid - awful might happen 0 0 0 0  Total GAD 7 Score 0 0 0 0    BP Readings from Last 3 Encounters:  10/10/21 118/64  08/07/21 100/68  04/23/21 120/80    Physical Exam  Wt Readings from Last 3 Encounters:  10/10/21 204 lb (92.5 kg)  08/07/21 204 lb 9.6 oz (92.8 kg)  04/23/21 213 lb (96.6 kg)    BP 118/64   Pulse 76   Ht '5\' 1"'  (1.549 m)   Wt 204 lb (92.5 kg)   BMI 38.55 kg/m   Assessment and Plan:  1. Essential hypertension Chronic.  Controlled.  Stable.  Blood pressure is 118/64.  Continue hydrochlorothiazide 25 mg once a day and lisinopril 10  mg once a day.  Will check renal function panel for electrolytes and GFR status. - hydrochlorothiazide (HYDRODIURIL) 25 MG tablet; Take 1 tablet (25 mg total) by mouth daily.  Dispense: 90 tablet; Refill: 1 - lisinopril (ZESTRIL) 10 MG tablet; Take 1 tablet (10 mg total) by mouth daily.  Dispense: 90 tablet; Refill: 1 - Renal Function Panel  2. Familial hypercholesterolemia Chronic.  Controlled.  Stable.  Currently controlled on atorvastatin 10  mg once a day.  Review of last lipid panel of 2 to 3 months ago was in acceptable in fact excellent range and we will continue at current dosing and recheck in 6 months - atorvastatin (LIPITOR) 10 MG tablet; Take 1 tablet (10 mg total) by mouth daily.  Dispense: 90 tablet; Refill: 1  3. Need for immunization against influenza Discussed and administered. - Flu Vaccine QUAD 31moIM (Fluarix, Fluzone & Alfiuria Quad PF)

## 2021-10-11 LAB — RENAL FUNCTION PANEL
Albumin: 4.4 g/dL (ref 3.8–4.9)
BUN/Creatinine Ratio: 17 (ref 9–23)
BUN: 14 mg/dL (ref 6–24)
CO2: 24 mmol/L (ref 20–29)
Calcium: 9.4 mg/dL (ref 8.7–10.2)
Chloride: 105 mmol/L (ref 96–106)
Creatinine, Ser: 0.84 mg/dL (ref 0.57–1.00)
Glucose: 97 mg/dL (ref 70–99)
Phosphorus: 4.5 mg/dL — ABNORMAL HIGH (ref 3.0–4.3)
Potassium: 5 mmol/L (ref 3.5–5.2)
Sodium: 141 mmol/L (ref 134–144)
eGFR: 80 mL/min/{1.73_m2} (ref >59)

## 2021-11-28 DIAGNOSIS — M25562 Pain in left knee: Secondary | ICD-10-CM | POA: Diagnosis not present

## 2021-11-28 DIAGNOSIS — M4716 Other spondylosis with myelopathy, lumbar region: Secondary | ICD-10-CM | POA: Diagnosis not present

## 2021-11-28 DIAGNOSIS — Z79891 Long term (current) use of opiate analgesic: Secondary | ICD-10-CM | POA: Diagnosis not present

## 2021-11-28 DIAGNOSIS — M25561 Pain in right knee: Secondary | ICD-10-CM | POA: Diagnosis not present

## 2021-11-28 DIAGNOSIS — G894 Chronic pain syndrome: Secondary | ICD-10-CM | POA: Diagnosis not present

## 2021-11-28 DIAGNOSIS — Z79899 Other long term (current) drug therapy: Secondary | ICD-10-CM | POA: Diagnosis not present

## 2021-11-28 DIAGNOSIS — M5116 Intervertebral disc disorders with radiculopathy, lumbar region: Secondary | ICD-10-CM | POA: Diagnosis not present

## 2021-12-10 ENCOUNTER — Other Ambulatory Visit: Payer: Self-pay | Admitting: Family Medicine

## 2021-12-10 ENCOUNTER — Other Ambulatory Visit: Payer: Self-pay

## 2021-12-10 DIAGNOSIS — E7801 Familial hypercholesterolemia: Secondary | ICD-10-CM

## 2021-12-11 NOTE — Telephone Encounter (Signed)
Rx written 10/10/2021 #90 with 1 refill. Pt should have 3 months of medication left.

## 2021-12-31 DIAGNOSIS — M5116 Intervertebral disc disorders with radiculopathy, lumbar region: Secondary | ICD-10-CM | POA: Diagnosis not present

## 2022-02-17 ENCOUNTER — Other Ambulatory Visit: Payer: Self-pay | Admitting: Family Medicine

## 2022-02-17 DIAGNOSIS — I10 Essential (primary) hypertension: Secondary | ICD-10-CM

## 2022-02-17 DIAGNOSIS — E7801 Familial hypercholesterolemia: Secondary | ICD-10-CM

## 2022-02-20 DIAGNOSIS — M25561 Pain in right knee: Secondary | ICD-10-CM | POA: Diagnosis not present

## 2022-02-20 DIAGNOSIS — M4716 Other spondylosis with myelopathy, lumbar region: Secondary | ICD-10-CM | POA: Diagnosis not present

## 2022-02-20 DIAGNOSIS — Z79899 Other long term (current) drug therapy: Secondary | ICD-10-CM | POA: Diagnosis not present

## 2022-02-20 DIAGNOSIS — G894 Chronic pain syndrome: Secondary | ICD-10-CM | POA: Diagnosis not present

## 2022-02-20 DIAGNOSIS — Z79891 Long term (current) use of opiate analgesic: Secondary | ICD-10-CM | POA: Diagnosis not present

## 2022-02-20 DIAGNOSIS — M25562 Pain in left knee: Secondary | ICD-10-CM | POA: Diagnosis not present

## 2022-02-20 DIAGNOSIS — M5116 Intervertebral disc disorders with radiculopathy, lumbar region: Secondary | ICD-10-CM | POA: Diagnosis not present

## 2022-04-09 DIAGNOSIS — F17200 Nicotine dependence, unspecified, uncomplicated: Secondary | ICD-10-CM | POA: Diagnosis not present

## 2022-04-09 DIAGNOSIS — E1169 Type 2 diabetes mellitus with other specified complication: Secondary | ICD-10-CM | POA: Diagnosis not present

## 2022-04-09 DIAGNOSIS — R809 Proteinuria, unspecified: Secondary | ICD-10-CM | POA: Diagnosis not present

## 2022-04-09 DIAGNOSIS — E785 Hyperlipidemia, unspecified: Secondary | ICD-10-CM | POA: Diagnosis not present

## 2022-04-09 DIAGNOSIS — E1165 Type 2 diabetes mellitus with hyperglycemia: Secondary | ICD-10-CM | POA: Diagnosis not present

## 2022-04-09 DIAGNOSIS — I152 Hypertension secondary to endocrine disorders: Secondary | ICD-10-CM | POA: Diagnosis not present

## 2022-04-09 DIAGNOSIS — E1129 Type 2 diabetes mellitus with other diabetic kidney complication: Secondary | ICD-10-CM | POA: Diagnosis not present

## 2022-04-09 DIAGNOSIS — E1159 Type 2 diabetes mellitus with other circulatory complications: Secondary | ICD-10-CM | POA: Diagnosis not present

## 2022-04-09 LAB — HM DIABETES FOOT EXAM: HM Diabetic Foot Exam: NORMAL

## 2022-04-09 LAB — HEMOGLOBIN A1C: Hemoglobin A1C: 6.6

## 2022-04-10 ENCOUNTER — Encounter: Payer: Self-pay | Admitting: Family Medicine

## 2022-04-10 ENCOUNTER — Ambulatory Visit (INDEPENDENT_AMBULATORY_CARE_PROVIDER_SITE_OTHER): Payer: PPO | Admitting: Family Medicine

## 2022-04-10 VITALS — BP 110/80 | HR 80 | Ht 61.0 in | Wt 201.0 lb

## 2022-04-10 DIAGNOSIS — I10 Essential (primary) hypertension: Secondary | ICD-10-CM

## 2022-04-10 DIAGNOSIS — E7801 Familial hypercholesterolemia: Secondary | ICD-10-CM | POA: Diagnosis not present

## 2022-04-10 MED ORDER — LISINOPRIL 10 MG PO TABS: 10.0000 mg | ORAL_TABLET | Freq: Every day | ORAL | 1 refills | Status: DC

## 2022-04-10 MED ORDER — ATORVASTATIN CALCIUM 10 MG PO TABS: 10.0000 mg | ORAL_TABLET | Freq: Every day | ORAL | 1 refills | Status: DC

## 2022-04-10 MED ORDER — HYDROCHLOROTHIAZIDE 25 MG PO TABS: 25.0000 mg | ORAL_TABLET | Freq: Every day | ORAL | 1 refills | Status: DC

## 2022-04-10 NOTE — Progress Notes (Signed)
? ? ?Date:  04/10/2022  ? ?Name:  Erica Ruiz [facility]   DOB:  09-Feb-1961   MRN:  [id number] ? ? ?Chief Complaint: Hypertension and Hyperlipidemia ? ?Hypertension ?This is a chronic problem. The current episode started more than 1 year ago. The problem has been gradually improving since onset. The problem is controlled. Pertinent negatives include no chest pain, headaches, orthopnea, palpitations, peripheral edema, PND or shortness of breath. Risk factors for coronary artery disease include dyslipidemia. Past treatments include ACE inhibitors and diuretics. The current treatment provides moderate improvement. There are no compliance problems.  There is no history of angina, kidney disease, CAD/MI, CVA, heart failure, left ventricular hypertrophy, PVD or retinopathy. There is no history of chronic renal disease, a hypertension causing med or renovascular disease.  ?Hyperlipidemia ?This is a chronic problem. The current episode started more than 1 year ago. The problem is controlled. Recent lipid tests were reviewed and are normal. She has no history of chronic renal disease. Factors aggravating her hyperlipidemia include thiazides. Pertinent negatives include no chest pain, focal sensory loss, focal weakness, leg pain, myalgias or shortness of breath. Current antihyperlipidemic treatment includes statins. The current treatment provides moderate improvement of lipids. There are no compliance problems.   ? ?Lab Results  ?Component Value Date  ? NA 141 10/10/2021  ? K 5.0 10/10/2021  ? CO2 24 10/10/2021  ? GLUCOSE 97 10/10/2021  ? BUN 14 10/10/2021  ? CREATININE 0.84 10/10/2021  ? CALCIUM 9.4 10/10/2021  ? EGFR 80 10/10/2021  ? GFRNONAA 92 10/14/2018  ? ?Lab Results  ?Component Value Date  ? CHOL 169 07/15/2021  ? HDL 52 07/15/2021  ? [place] 95 07/15/2021  ? TRIG 121 07/15/2021  ? CHOLHDL 3.3 07/15/2021  ? ?Lab Results  ?Component Value Date  ? TSH 0.852 04/16/2018  ? ?Lab Results  ?Component Value Date  ? HGBA1C 6.6  04/09/2022  ? ?Lab Results  ?Component Value Date  ? WBC 7.4 04/05/2018  ? HGB 14.3 04/05/2018  ? HCT 41.6 04/05/2018  ? MCV 87.3 04/05/2018  ? PLT 178 04/05/2018  ? ?Lab Results  ?Component Value Date  ? ALT 19 06/21/2020  ? AST 16 06/21/2020  ? ?No results found for: 25OHVITD2, [place], VD25OH  ? ?Review of Systems  ?Constitutional: Negative.  Negative for chills, fatigue, fever and unexpected weight change.  ?HENT:  Negative for congestion, ear discharge, ear pain, rhinorrhea, sinus pressure, sneezing and sore throat.   ?Respiratory:  Negative for cough, shortness of breath, wheezing and stridor.   ?Cardiovascular:  Negative for chest pain, palpitations, orthopnea and PND.  ?Gastrointestinal:  Negative for abdominal pain, blood in stool, constipation, diarrhea and nausea.  ?Genitourinary:  Negative for dysuria, flank pain, frequency, hematuria, urgency and vaginal discharge.  ?Musculoskeletal:  Negative for arthralgias, back pain and myalgias.  ?Skin:  Negative for rash.  ?Neurological:  Negative for dizziness, focal weakness, weakness and headaches.  ?Hematological:  Negative for adenopathy. Does not bruise/bleed easily.  ?Psychiatric/Behavioral:  Negative for dysphoric mood. The patient is not nervous/anxious.   ? ?Patient Active Problem List  ? Diagnosis Date Noted  ? Type 2 diabetes mellitus with hyperglycemia, without long-term current use of insulin ([place]) 09/17/2018  ? Tobacco dependence 09/17/2018  ? Special screening for malignant neoplasms, colon   ? Benign neoplasm of transverse colon   ? Polyp of sigmoid colon   ? Rectal polyp   ? Hyperglycemia 04/16/2018  ? Essential hypertension 04/16/2018  ? Venous insufficiency 04/16/2018  ?  Abnormal weight gain 04/16/2018  ? ? ?No Known Allergies ? ?Past Surgical History:  ?Procedure Laterality Date  ? BREAST BIOPSY Right   ? neg bx/clip  ? CHOLECYSTECTOMY    ? COLON SURGERY    ? COLONOSCOPY WITH PROPOFOL N/A 08/23/2018  ? Procedure: COLONOSCOPY WITH PROPOFOL with  biopsies;  Surgeon: Lucilla Lame, MD;  Location: [place];  Service: Endoscopy;  Laterality: N/A;  DIABETIC  ? COLOSTOMY REVERSAL    ? KNEE SURGERY    ? POLYPECTOMY N/A 08/23/2018  ? Procedure: POLYPECTOMY;  Surgeon: Lucilla Lame, MD;  Location: [place];  Service: Endoscopy;  Laterality: N/A;  ? TONSILLECTOMY    ? TUBAL LIGATION    ? ? ?Social History  ? ?Tobacco Use  ? Smoking status: Every Day  ?  Packs/day: 0.50  ?  Years: 25.00  ?  Pack years: 12.50  ?  Types: Cigarettes  ? Smokeless tobacco: Never  ?Vaping Use  ? Vaping Use: Never used  ?Substance Use Topics  ? Alcohol use: Never  ? Drug use: Never  ? ? ? ?Medication list has been reviewed and updated. ? ?Current Meds  ?Medication Sig  ? atorvastatin (LIPITOR) 10 MG tablet TAKE 1 TABLET BY MOUTH ONCE DAILY  ? DULoxetine (CYMBALTA) 30 MG capsule TK ONE C PO ONCE A DAY FOR 90 DAYS- pain management  ? fluticasone (FLONASE) 50 MCG/ACT nasal spray Place 2 sprays into both nostrils daily.  ? gabapentin (NEURONTIN) 600 MG tablet TK 1 T PO BID- pain management  ? glucose blood test strip 1 each by Other route 2 (two) times daily.  ? hydrochlorothiazide (HYDRODIURIL) 25 MG tablet Take 1 tablet (25 mg total) by mouth daily.  ? Lancets (ONETOUCH ULTRASOFT) lancets 1 each by Other route 2 (two) times daily.  ? lisinopril (ZESTRIL) 10 MG tablet TAKE 1 TABLET BY MOUTH ONCE DAILY  ? morphine (MS CONTIN) 15 MG 12 hr tablet TK 1 T PO  Q 8 H FOR 30 DAYS- pain management  ? naproxen sodium (ALEVE) 220 MG tablet Take 220 mg by mouth every 12 (twelve) hours as needed.  ? NARCAN 4 MG/0.1ML LIQD nasal spray kit NAR REP ALN  ? Oxycodone HCl 10 MG TABS Take 10 mg by mouth 4 (four) times daily as needed. Pain clinic  ? SYNJARDY XR 12.04-999 MG TB24 Take by mouth. [place]  ? tiZANidine (ZANAFLEX) 4 MG tablet TK 1 T PO Q 8 H PRF SPASMS- pain management  ? [DISCONTINUED] empagliflozin (JARDIANCE) 25 MG TABS tablet Take 25 mg by mouth daily. Hilary  ? ? ? ?  04/10/2022   ?  3:09 PM 10/10/2021  ?  9:21 AM 04/23/2021  ?  9:13 AM 11/06/2020  ?  2:01 PM  ?GAD 7 : Generalized Anxiety Score  ?Nervous, Anxious, on Edge 0 0 0 0  ?Control/stop worrying 0 0 0 0  ?Worry too much - different things 0 0 0 0  ?Trouble relaxing 0 0 0 0  ?Restless 0 0 0 0  ?Easily annoyed or irritable 0 0 0 0  ?Afraid - awful might happen 0 0 0 0  ?Total GAD 7 Score 0 0 0 0  ?Anxiety Difficulty Not difficult at all     ? ? ? ?  04/10/2022  ?  3:08 PM  ?Depression screen PHQ 2/9  ?Decreased Interest 0  ?Down, Depressed, Hopeless 0  ?PHQ - 2 Score 0  ?Altered sleeping 0  ?Tired, decreased energy 1  ?  Change in appetite 1  ?Feeling bad or failure about yourself  1  ?Trouble concentrating 0  ?Moving slowly or fidgety/restless 0  ?Suicidal thoughts 0  ?PHQ-9 Score 3  ?Difficult doing work/chores Not difficult at all  ? ? ?BP Readings from Last 3 Encounters:  ?04/10/22 110/80  ?10/10/21 118/64  ?08/07/21 100/68  ? ? ?Physical Exam ?Vitals and nursing note reviewed. Exam conducted with a chaperone present.  ?Constitutional:   ?   General: She is not in acute distress. ?   Appearance: She is not diaphoretic.  ?HENT:  ?   Head: Normocephalic and atraumatic.  ?   Right Ear: Tympanic membrane and external ear normal.  ?   Left Ear: Tympanic membrane and external ear normal.  ?   Nose: Nose normal. No congestion or rhinorrhea.  ?   Mouth/Throat:  ?   Mouth: Mucous membranes are moist.  ?Eyes:  ?   General:     ?   Right eye: No discharge.     ?   Left eye: No discharge.  ?   Conjunctiva/sclera: Conjunctivae normal.  ?   Pupils: Pupils are equal, round, and reactive to light.  ?Neck:  ?   Thyroid: No thyromegaly.  ?   Vascular: No carotid bruit or JVD.  ?Cardiovascular:  ?   Rate and Rhythm: Normal rate and regular rhythm.  ?   Heart sounds: Normal heart sounds. No murmur heard. ?  No friction rub. No gallop.  ?Pulmonary:  ?   Effort: Pulmonary effort is normal.  ?   Breath sounds: Normal breath sounds. No wheezing or rhonchi.   ?Abdominal:  ?   General: Bowel sounds are normal.  ?   Palpations: Abdomen is soft. There is no mass.  ?   Tenderness: There is no abdominal tenderness. There is no guarding.  ?Musculoskeletal:     ?   G

## 2022-04-11 ENCOUNTER — Encounter: Payer: Self-pay | Admitting: Family Medicine

## 2022-04-11 LAB — LIPID PANEL WITH LDL/HDL RATIO
Cholesterol, Total: 169 mg/dL (ref 100–199)
HDL: 51 mg/dL (ref >39)
LDL Chol Calc (NIH): 100 mg/dL — ABNORMAL HIGH (ref 0–99)
LDL/HDL Ratio: 2 ratio (ref 0.0–3.2)
Triglycerides: 98 mg/dL (ref 0–149)
VLDL Cholesterol Cal: 18 mg/dL (ref 5–40)

## 2022-04-11 LAB — RENAL FUNCTION PANEL
Albumin: 4.2 g/dL (ref 3.8–4.9)
BUN/Creatinine Ratio: 19 (ref 9–23)
BUN: 16 mg/dL (ref 6–24)
CO2: 24 mmol/L (ref 20–29)
Calcium: 9.3 mg/dL (ref 8.7–10.2)
Chloride: 100 mmol/L (ref 96–106)
Creatinine, Ser: 0.86 mg/dL (ref 0.57–1.00)
Glucose: 90 mg/dL (ref 70–99)
Phosphorus: 4.7 mg/dL — ABNORMAL HIGH (ref 3.0–4.3)
Potassium: 4.7 mmol/L (ref 3.5–5.2)
Sodium: 139 mmol/L (ref 134–144)
eGFR: 78 mL/min/{1.73_m2} (ref >59)

## 2022-05-22 DIAGNOSIS — M25562 Pain in left knee: Secondary | ICD-10-CM | POA: Diagnosis not present

## 2022-05-22 DIAGNOSIS — G894 Chronic pain syndrome: Secondary | ICD-10-CM | POA: Diagnosis not present

## 2022-05-22 DIAGNOSIS — Z79891 Long term (current) use of opiate analgesic: Secondary | ICD-10-CM | POA: Diagnosis not present

## 2022-05-22 DIAGNOSIS — M25561 Pain in right knee: Secondary | ICD-10-CM | POA: Diagnosis not present

## 2022-05-22 DIAGNOSIS — M5116 Intervertebral disc disorders with radiculopathy, lumbar region: Secondary | ICD-10-CM | POA: Diagnosis not present

## 2022-05-22 DIAGNOSIS — M4716 Other spondylosis with myelopathy, lumbar region: Secondary | ICD-10-CM | POA: Diagnosis not present

## 2022-05-22 DIAGNOSIS — Z79899 Other long term (current) drug therapy: Secondary | ICD-10-CM | POA: Diagnosis not present

## 2022-06-30 DIAGNOSIS — H2513 Age-related nuclear cataract, bilateral: Secondary | ICD-10-CM | POA: Diagnosis not present

## 2022-06-30 DIAGNOSIS — H40013 Open angle with borderline findings, low risk, bilateral: Secondary | ICD-10-CM | POA: Diagnosis not present

## 2022-06-30 DIAGNOSIS — E119 Type 2 diabetes mellitus without complications: Secondary | ICD-10-CM | POA: Diagnosis not present

## 2022-08-11 ENCOUNTER — Ambulatory Visit: Payer: PPO

## 2022-08-15 ENCOUNTER — Ambulatory Visit (INDEPENDENT_AMBULATORY_CARE_PROVIDER_SITE_OTHER): Payer: PPO

## 2022-08-15 VITALS — BP 118/68 | HR 87 | Temp 98.8°F | Ht 61.0 in | Wt 192.0 lb

## 2022-08-15 DIAGNOSIS — Z1231 Encounter for screening mammogram for malignant neoplasm of breast: Secondary | ICD-10-CM

## 2022-08-15 DIAGNOSIS — Z0000 Encounter for general adult medical examination without abnormal findings: Secondary | ICD-10-CM | POA: Diagnosis not present

## 2022-08-15 NOTE — Progress Notes (Signed)
Subjective:   Erica Ruiz is a 61 y.o. female who presents for Medicare Annual (Subsequent) preventive examination.  Review of Systems    Per HPI unless specifically indicated above   Cardiac Risk Factors include: advanced age (>69mn, >>53women);female gender, hypertension, tobacco dependence, and Type 2 diabetes mellitus        Objective:    Today's Vitals   08/15/22 1443 08/15/22 1457  BP: 118/68   Pulse: 87   Temp: 98.8 F (37.1 C)   TempSrc: Oral   SpO2: 96%   Weight: 192 lb (87.1 kg)   Height: '5\' 1"'  (1.549 m)   PainSc: 6  6   PainLoc: Back    Body mass index is 36.28 kg/m.     08/07/2021    3:27 PM 08/06/2020    3:39 PM 08/03/2019    3:37 PM 08/23/2018    7:42 AM 07/28/2018    1:57 PM 06/28/2018    4:29 PM  Advanced Directives  Does Patient Have a Medical Advance Directive? No No No No No No  Would patient like information on creating a medical advance directive? No - Patient declined No - Patient declined Yes (MAU/Ambulatory/Procedural Areas - Information given) No - Patient declined Yes (MAU/Ambulatory/Procedural Areas - Information given) No - Patient declined    Current Medications (verified) Outpatient Encounter Medications as of 08/15/2022  Medication Sig   atorvastatin (LIPITOR) 10 MG tablet Take 1 tablet (10 mg total) by mouth daily.   DULoxetine (CYMBALTA) 30 MG capsule TK ONE C PO ONCE A DAY FOR 90 DAYS- pain management   fluticasone (FLONASE) 50 MCG/ACT nasal spray Place 2 sprays into both nostrils daily.   gabapentin (NEURONTIN) 600 MG tablet TK 1 T PO BID- pain management   glucose blood test strip 1 each by Other route 2 (two) times daily.   hydrochlorothiazide (HYDRODIURIL) 25 MG tablet Take 1 tablet (25 mg total) by mouth daily.   Lancets (ONETOUCH ULTRASOFT) lancets 1 each by Other route 2 (two) times daily.   lisinopril (ZESTRIL) 10 MG tablet Take 1 tablet (10 mg total) by mouth daily.   morphine (MS CONTIN) 15 MG 12 hr tablet TK 1 T PO  Q  8 H FOR 30 DAYS- pain management   naproxen sodium (ALEVE) 220 MG tablet Take 220 mg by mouth every 12 (twelve) hours as needed.   NARCAN 4 MG/0.1ML LIQD nasal spray kit NAR REP ALN   Oxycodone HCl 10 MG TABS Take 10 mg by mouth 4 (four) times daily as needed. Pain clinic   SYNJARDY XR 12.04-999 MG TB24 Take 2 tablets by mouth daily. [place]   tiZANidine (ZANAFLEX) 4 MG tablet TK 1 T PO Q 8 H PRF SPASMS- pain management   No facility-administered encounter medications on file as of 08/15/2022.    Allergies (verified) Patient has no known allergies.   History: Past Medical History:  Diagnosis Date   Back pain    Diabetes mellitus without complication ([place]    Hypertension    Past Surgical History:  Procedure Laterality Date   BREAST BIOPSY Right    neg bx/clip   CHOLECYSTECTOMY     COLON SURGERY     COLONOSCOPY WITH PROPOFOL N/A 08/23/2018   Procedure: COLONOSCOPY WITH PROPOFOL with biopsies;  Surgeon: WLucilla Lame MD;  [facility]  Service: Endoscopy;  Laterality: N/A;  DIABETIC   COLOSTOMY REVERSAL     KNEE SURGERY     POLYPECTOMY N/A 08/23/2018  Procedure: POLYPECTOMY;  Surgeon: Lucilla Lame, MD;  Location: [facility];  Service: Endoscopy;  Laterality: N/A;   TONSILLECTOMY     TUBAL LIGATION     Family History  Problem Relation Age of Onset   Pulmonary embolism Mother    Cancer Father        prostate   Heart disease Father    Hypertension Father    Cancer Sister        non-hodgkin's lymphoma   Hepatitis C Sister    Cancer Brother        colon   Diabetes Brother    Heart disease Brother    Hypertension Brother    Diabetes Daughter    Diabetes Brother    Hypertension Sister    Cancer Brother        prostate   Breast cancer Paternal Aunt    Social History   Socioeconomic History   Marital status: Married    Spouse name: Hyman Bower   Number of children: 4   Years of education: Not on file   Highest education level: 12th grade   Occupational History   Occupation: Disabled  Tobacco Use   Smoking status: Every Day    Packs/day: 0.50    Years: 25.00    Total pack years: 12.50    Types: Cigarettes   Smokeless tobacco: Never  Vaping Use   Vaping Use: Never used  Substance and Sexual Activity   Alcohol use: Never   Drug use: Never   Sexual activity: Not Currently    Birth control/protection: Spermicide  Other Topics Concern   Not on file  Social History Narrative   Not on file   Social Determinants of Health   Financial Resource Strain: Low Risk  (08/15/2022)   Overall Financial Resource Strain (CARDIA)    Difficulty of Paying Living Expenses: Not hard at all  Food Insecurity: No Food Insecurity (08/15/2022)   Hunger Vital Sign    Worried About Running Out of Food in the Last Year: Never true    [place] in the Last Year: Never true  Transportation Needs: No Transportation Needs (08/15/2022)   PRAPARE - Hydrologist (Medical): No    Lack of Transportation (Non-Medical): No  Physical Activity: Sufficiently Active (08/15/2022)   Exercise Vital Sign    Days of Exercise per Week: 7 days    Minutes of Exercise per Session: 50 min  Stress: No Stress Concern Present (08/15/2022)   [place]    Feeling of Stress : Not at all  Social Connections: [place] (08/15/2022)   Social Connection and Isolation Panel [NHANES]    Frequency of Communication with Friends and Family: More than three times a week    Frequency of Social Gatherings with Friends and Family: More than three times a week    Attends Religious Services: More than 4 times per year    Active Member of [employer] or Organizations: Yes    Attends Archivist Meetings: Never    Marital Status: Married    Tobacco Counseling Ready to quit: Not Answered Counseling given: Not Answered   Clinical Intake:  Pre-visit preparation completed:  No  Pain : 0-10 Pain Score: 6  Pain Type: Chronic pain Pain Location: Back Pain Orientation: Right Pain Descriptors / Indicators: Shooting, Other (Comment) (hot sensation) Pain Frequency: Intermittent Pain Relieving Factors: sitting and changing position helps to relieve the pain  Pain Relieving Factors: sitting and changing position helps to relieve the pain  Nutritional Status: BMI > 30  Obese Nutritional Risks: None Diabetes: Yes CBG done?: Yes CBG resulted in Enter/ Edit results?: Yes Did pt. bring in CBG monitor from home?: Yes  How often do you need to have someone help you when you read instructions, pamphlets, or other written materials from your doctor or pharmacy?: 1 - Never  Diabetic?Nutrition Risk Assessment:  Has the patient had any N/V/D within the last 2 months?  Yes  Does the patient have any non-healing wounds?  No  Has the patient had any unintentional weight loss or weight gain?  No   Diabetes:  Is the patient diabetic?  Yes  If diabetic, was a CBG obtained today?  Yes  Did the patient bring in their glucometer from home?  No  How often do you monitor your CBG's?  Twice daily .   Financial Strains and Diabetes Management:  Are you having any financial strains with the device, your supplies or your medication? No .  Does the patient want to be seen by Chronic Care Management for management of their diabetes?  No  Would the patient like to be referred to a Nutritionist or for Diabetic Management?  No   Diabetic Exams:  Diabetic Eye Exam: Completed 08/30/2021 Diabetic Foot Exam: Completed 04/09/2022    Interpreter Needed?: No  Information entered by :: Donnie Mesa, [place]   Activities of Daily Living    08/15/2022    2:55 PM 04/10/2022    3:11 PM  In your present state of health, do you have any difficulty performing the following activities:  Hearing? 0 0  Vision? 1 0  Difficulty concentrating or making decisions? 0 0  Walking or climbing  stairs? 1 1  Dressing or bathing? 0 0  Doing errands, shopping? 0 0    Patient Care Team: Juline Patch, MD as PCP - General (Family Medicine) Warnell Forester, NP (Inactive) as Nurse Practitioner (Surgery)  Indicate any recent Medical Services you may have received from other than [facility] providers in the past year (date may be approximate). No hospitalization in the past 12 month.    Assessment:   This is a routine wellness examination for [place].  Hearing/Vision screen Denies any hearing issues. Wear prescription glasses, Annual Eye exam   Dietary issues and exercise activities discussed: Current Exercise Habits: Structured exercise class, Type of exercise: walking;stretching, Time (Minutes): 15, Frequency (Times/Week): 7, Weekly Exercise (Minutes/Week): 105, Intensity: Mild, Exercise limited by: orthopedic condition(s)   Goals Addressed             This Visit's Progress    Stay Active and Independent-Low Back Pain       Why is this important?   Regular activity or exercise is important to managing back pain.  Activity helps to keep your muscles strong.  You will sleep better and feel more relaxed.  You will have more energy and feel less stressed.  If you are not active now, start slowly. Little changes make a big difference.  Rest, but not too much.  Stay as active as you can and listen to your body's signals.            Depression Screen    08/15/2022    3:18 PM 04/10/2022    3:08 PM 10/10/2021    9:21 AM 08/07/2021    3:25 PM 04/23/2021    9:13 AM 11/06/2020    2:01 PM 08/06/2020  3:38 PM  PHQ 2/9 Scores  PHQ - 2 Score 0 0 0 0 0 0 0  PHQ- 9 Score 2 3 0  0 0     Fall Risk    08/15/2022    2:55 PM 08/07/2021    3:27 PM 11/06/2020    2:00 PM 08/06/2020    3:41 PM 05/04/2020    3:30 PM  [place] in the past year? 1 1 0 1 1  Number falls in past yr: 0 '1  1 1  ' Injury with Fall? 0 0  0 0  Risk for fall due to : Impaired balance/gait History of  fall(s);Impaired balance/gait  History of fall(s);Impaired balance/gait;Orthopedic patient   Follow up Falls evaluation completed Falls prevention discussed Falls evaluation completed Falls prevention discussed Falls evaluation completed    FALL RISK PREVENTION PERTAINING TO THE HOME:  Any stairs in or around the home? No  If so, are there any without handrails? No  Home free of loose throw rugs in walkways, pet beds, electrical cords, etc? Yes  Adequate lighting in your home to reduce risk of falls? Yes   ASSISTIVE DEVICES UTILIZED TO PREVENT FALLS:  Life alert? No  Use of a cane, walker or w/c? Yes  Grab bars in the bathroom? Yes  Shower chair or bench in shower? Yes  Elevated toilet seat or a handicapped toilet? No   TIMED UP AND GO:  Was the test performed? Yes .  Length of time to ambulate 10 feet: 10 sec.   Gait slow and steady without use of assistive device  Cognitive Function:        08/15/2022    2:55 PM 08/06/2020    3:42 PM 08/03/2019    3:41 PM 07/28/2018    2:00 PM  6CIT Screen  What Year? 0 points 0 points 0 points 0 points  What month? 0 points 0 points 0 points 0 points  What time? 0 points 0 points 0 points 0 points  Count back from 20 0 points 0 points 0 points 0 points  Months in reverse 0 points 0 points 0 points 4 points  Repeat phrase 0 points 0 points 0 points 6 points  Total Score 0 points 0 points 0 points 10 points    Immunizations Immunization History  Administered Date(s) Administered   Influenza Inj Mdck Quad With Preservative 12/17/2017   Influenza,inj,Quad PF,6+ Mos 10/14/2018, 10/10/2021   Influenza-Unspecified 08/29/2020   PNEUMOCOCCAL CONJUGATE-20 08/07/2021   Tdap 08/04/2018    TDAP status: Up to date  Flu Vaccine status: Due, Education has been provided regarding the importance of this vaccine. Advised may receive this vaccine at local pharmacy or Health Dept. Aware to provide a copy of the vaccination record if obtained from  local pharmacy or Health Dept. Verbalized acceptance and understanding.  Pneumococcal vaccine status: Up to date  Covid-19 vaccine status: Declined, Education has been provided regarding the importance of this vaccine but patient still declined. Advised may receive this vaccine at local pharmacy or Health Dept.or vaccine clinic. Aware to provide a copy of the vaccination record if obtained from local pharmacy or Health Dept. Verbalized acceptance and understanding.  Qualifies for Shingles Vaccine? Yes   Zostavax completed No   Shingrix Completed?: No.    Education has been provided regarding the importance of this vaccine. Patient has been advised to call insurance company to determine out of pocket expense if they have not yet received this vaccine. Advised may  also receive vaccine at local pharmacy or Health Dept. Verbalized acceptance and understanding.  Screening Tests Health Maintenance  Topic Date Due   Zoster Vaccines- Shingrix (1 of 2) Never done   MAMMOGRAM  05/01/2022   INFLUENZA VACCINE  07/15/2022   OPHTHALMOLOGY EXAM  08/30/2022   HEMOGLOBIN A1C  10/09/2022   FOOT EXAM  04/10/2023   PAP SMEAR-Modifier  08/06/2023   COLONOSCOPY (Pts 45-69yr Insurance coverage will need to be confirmed)  08/24/2023   TETANUS/TDAP  08/04/2028   Hepatitis C Screening  Completed   HPV VACCINES  Aged Out   COVID-19 Vaccine  Discontinued   HIV Screening  Discontinued    Health Maintenance  Health Maintenance Due  Topic Date Due   Zoster Vaccines- Shingrix (1 of 2) Never done   MAMMOGRAM  05/01/2022   INFLUENZA VACCINE  07/15/2022    Colorectal cancer screening: Type of screening: Colonoscopy. Completed 08/23/2018. Repeat every 5 years  Mammogram status: Ordered 08/15/2022. Pt provided with contact info and advised to call to schedule appt.     Lung Cancer Screening: (Low Dose CT Chest recommended if Age 60-80years, 30 pack-year currently smoking OR have quit w/in 15years.) does  qualify.   Lung Cancer Screening Referral: referral placed   Additional Screening:  Hepatitis C Screening: does qualify; Completed 08/04/2018  Vision Screening: Recommended annual ophthalmology exams for early detection of glaucoma and other disorders of the eye. Is the patient up to date with their annual eye exam?  Yes  Who is the provider or what is the name of the office in which the patient attends [facility] If pt is not established with a provider, would they like to be referred to a provider to establish care? No .   Dental Screening: Recommended annual dental exams for proper oral hygiene  Community Resource Referral / Chronic Care Management: CRR required this visit?  No   CCM required this visit?  No      Plan:     I have personally reviewed and noted the following in the patient's chart:   Medical and social history Use of alcohol, tobacco or illicit drugs  Current medications and supplements including opioid prescriptions. Patient is currently taking opioid prescriptions. Information provided to patient regarding non-opioid alternatives. Patient advised to discuss non-opioid treatment plan with their provider. Functional ability and status Nutritional status Physical activity Advanced directives List of other physicians Hospitalizations, surgeries, and ER visits in previous 12 months Vitals Screenings to include cognitive, depression, and falls Referrals and appointments  In addition, I have reviewed and discussed with patient certain preventive protocols, quality metrics, and best practice recommendations. A written personalized care plan for preventive services as well as general preventive health recommendations were provided to patient.     [facility] Thank you for taking time to come for your Medicare Wellness Visit. I appreciate your ongoing commitment to your health goals. Please review the following plan we discussed  and let me know if I can assist you in the future.   These are the goals we discussed:  Goals      DIET - INCREASE WATER INTAKE     Recommend to drink at least 6-8 8oz glasses of water per day.     Quit Smoking     Recommend to enroll in smoking cessation classes or discuss possibility of smoking cessation alternatives with provider     Stay Active and Independent-Low Back Pain  Why is this important?   Regular activity or exercise is important to managing back pain.  Activity helps to keep your muscles strong.  You will sleep better and feel more relaxed.  You will have more energy and feel less stressed.  If you are not active now, start slowly. Little changes make a big difference.  Rest, but not too much.  Stay as active as you can and listen to your body's signals.             This is a list of the screening recommended for you and due dates:  Health Maintenance  Topic Date Due   Zoster (Shingles) Vaccine (1 of 2) Never done   Mammogram  05/01/2022   Flu Shot  07/15/2022   Eye exam for diabetics  08/30/2022   Hemoglobin A1C  10/09/2022   Complete foot exam   04/10/2023   Pap Smear  08/06/2023   Colon Cancer Screening  08/24/2023   Tetanus Vaccine  08/04/2028   Hepatitis C Screening: USPSTF Recommendation to screen - Ages 18-79 yo.  Completed   HPV Vaccine  Aged Out   COVID-19 Vaccine  Discontinued   HIV Screening  Discontinued    Wilson Singer, [facility]   08/15/2022   Nurse Notes: Approximately 30 minute face-to face visit

## 2022-08-15 NOTE — Patient Instructions (Signed)

## 2022-08-19 ENCOUNTER — Telehealth: Payer: PPO | Admitting: Family Medicine

## 2022-08-19 DIAGNOSIS — K0889 Other specified disorders of teeth and supporting structures: Secondary | ICD-10-CM

## 2022-08-19 NOTE — Progress Notes (Signed)
[place]   Jaw swelling, fever, pain and trouble swallowing, talking and or chewing  In person recommended

## 2022-08-21 DIAGNOSIS — M5116 Intervertebral disc disorders with radiculopathy, lumbar region: Secondary | ICD-10-CM | POA: Diagnosis not present

## 2022-08-21 DIAGNOSIS — Z79891 Long term (current) use of opiate analgesic: Secondary | ICD-10-CM | POA: Diagnosis not present

## 2022-08-21 DIAGNOSIS — M25562 Pain in left knee: Secondary | ICD-10-CM | POA: Diagnosis not present

## 2022-08-21 DIAGNOSIS — M4716 Other spondylosis with myelopathy, lumbar region: Secondary | ICD-10-CM | POA: Diagnosis not present

## 2022-08-21 DIAGNOSIS — G894 Chronic pain syndrome: Secondary | ICD-10-CM | POA: Diagnosis not present

## 2022-08-21 DIAGNOSIS — M25561 Pain in right knee: Secondary | ICD-10-CM | POA: Diagnosis not present

## 2022-08-21 DIAGNOSIS — Z79899 Other long term (current) drug therapy: Secondary | ICD-10-CM | POA: Diagnosis not present

## 2022-09-24 ENCOUNTER — Other Ambulatory Visit: Payer: Self-pay | Admitting: Family Medicine

## 2022-09-24 DIAGNOSIS — I10 Essential (primary) hypertension: Secondary | ICD-10-CM

## 2022-09-24 DIAGNOSIS — E7801 Familial hypercholesterolemia: Secondary | ICD-10-CM

## 2022-09-24 NOTE — Telephone Encounter (Signed)
Requested Prescriptions  Pending Prescriptions Disp Refills  . atorvastatin (LIPITOR) 10 MG tablet [Pharmacy Med Name: ATORVASTATIN CALCIUM 10 MG TAB] 90 tablet 0    Sig: TAKE 1 TABLET BY MOUTH ONCE EVERY EVENING     Cardiovascular:  Antilipid - Statins Failed - 09/24/2022 10:15 AM      Failed - Lipid Panel in normal range within the last 12 months    Cholesterol, Total  Date Value Ref Range Status  04/10/2022 169 100 - 199 mg/dL Final   LDL Chol Calc (NIH)  Date Value Ref Range Status  04/10/2022 100 (H) 0 - 99 mg/dL Final   HDL  Date Value Ref Range Status  04/10/2022 51 >39 mg/dL Final   Triglycerides  Date Value Ref Range Status  04/10/2022 98 0 - 149 mg/dL Final         Passed - Patient is not pregnant      Passed - Valid encounter within last 12 months    Recent Outpatient Visits          5 months ago Essential hypertension   [place] [facility] at [place], Deanna C, MD   11 months ago Essential hypertension   [place] [facility] at [place], Deanna C, MD   1 year ago Essential hypertension   [place] [facility] at [place], Deanna C, MD   1 year ago Essential hypertension   [place] [facility] at [place], Deanna C, MD   2 years ago Essential hypertension   [place] [facility] at [place], [place], MD      Future Appointments            In 2 weeks Juline Patch, MD [facility] and [facility] at [facility], [facility]           . lisinopril (ZESTRIL) 10 MG tablet [Pharmacy Med Name: LISINOPRIL 10 MG TAB] 90 tablet 0    Sig: TAKE 1 TABLET BY MOUTH ONCE DAILY     Cardiovascular:  ACE Inhibitors Passed - 09/24/2022 10:15 AM      Passed - Cr in normal range and within 180 days    Creatinine, Ser  Date Value Ref Range Status  04/10/2022 0.86  0.57 - 1.00 mg/dL Final         Passed - K in normal range and within 180 days    Potassium  Date Value Ref Range Status  04/10/2022 4.7 3.5 - 5.2 mmol/L Final         Passed - Patient is not pregnant      Passed - Last BP in normal range    BP Readings from Last 1 Encounters:  08/15/22 118/68         Passed - Valid encounter within last 6 months    Recent Outpatient Visits          5 months ago Essential hypertension   [place] [facility] at [place], Deanna C, MD   11 months ago Essential hypertension   [place] and Sports Medicine at [place], Deanna C, MD   1 year ago Essential hypertension   [place] [facility] at [place], Deanna C, MD   1 year ago Essential hypertension   [place] [facility] at [facility], MD   2 years ago Essential hypertension   [place] [facility] at [facility], MD      Future Appointments            In 2 weeks Juline Patch, MD [facility] Primary Care and Sports Medicine at [facility], [place]           . hydrochlorothiazide (HYDRODIURIL) 25 MG tablet [Pharmacy Med Name: HYDROCHLOROTHIAZIDE 25 MG TAB] 90 tablet 0    Sig: TAKE 1 TABLET BY MOUTH ONCE DAILY     Cardiovascular: Diuretics - Thiazide Passed - 09/24/2022 10:15 AM      Passed - Cr in normal range and within 180 days    Creatinine, Ser  Date Value Ref Range Status  04/10/2022 0.86 0.57 - 1.00 mg/dL Final         Passed - K in normal range and within 180 days    Potassium  Date Value Ref Range Status  04/10/2022 4.7 3.5 - 5.2 mmol/L Final         Passed - Na in normal range and within 180 days    Sodium  Date Value Ref Range Status  04/10/2022 139 134 - 144 mmol/L Final         Passed - Last BP in normal range    BP Readings from Last 1 Encounters:  08/15/22 118/68          Passed - Valid encounter within last 6 months    Recent Outpatient Visits          5 months ago Essential hypertension   [place] [facility] at [place], Deanna C, MD   11 months ago Essential hypertension   [place] and [facility] Medicine at [place], Deanna C, MD   1 year ago Essential hypertension   [place] [facility] at [place], Deanna C, MD   1 year ago Essential hypertension   [place] [facility] at [place], Deanna C, MD   2 years ago Essential hypertension   [place] [facility] at [place], [place], MD      Future Appointments            In 2 weeks Juline Patch, MD [facility] Primary Care and Sports Medicine at [facility], [facility]

## 2022-10-10 ENCOUNTER — Ambulatory Visit (INDEPENDENT_AMBULATORY_CARE_PROVIDER_SITE_OTHER): Payer: PPO | Admitting: Family Medicine

## 2022-10-10 ENCOUNTER — Encounter: Payer: Self-pay | Admitting: Family Medicine

## 2022-10-10 VITALS — BP 122/78 | HR 88 | Ht 61.0 in | Wt 185.0 lb

## 2022-10-10 DIAGNOSIS — N3090 Cystitis, unspecified without hematuria: Secondary | ICD-10-CM | POA: Diagnosis not present

## 2022-10-10 DIAGNOSIS — E7801 Familial hypercholesterolemia: Secondary | ICD-10-CM

## 2022-10-10 DIAGNOSIS — I10 Essential (primary) hypertension: Secondary | ICD-10-CM

## 2022-10-10 DIAGNOSIS — E1165 Type 2 diabetes mellitus with hyperglycemia: Secondary | ICD-10-CM | POA: Diagnosis not present

## 2022-10-10 DIAGNOSIS — R634 Abnormal weight loss: Secondary | ICD-10-CM

## 2022-10-10 DIAGNOSIS — F17200 Nicotine dependence, unspecified, uncomplicated: Secondary | ICD-10-CM

## 2022-10-10 LAB — POCT URINALYSIS DIPSTICK
Bilirubin, UA: NEGATIVE
Glucose, UA: NEGATIVE
Ketones, UA: NEGATIVE
Nitrite, UA: NEGATIVE
Protein, UA: NEGATIVE
Spec Grav, UA: 1.02 (ref 1.010–1.025)
Urobilinogen, UA: 0.2 E.U./dL
pH, UA: 8 (ref 5.0–8.0)

## 2022-10-10 MED ORDER — ATORVASTATIN CALCIUM 10 MG PO TABS: ORAL_TABLET | ORAL | 1 refills | Status: DC

## 2022-10-10 MED ORDER — LISINOPRIL 10 MG PO TABS: 10.0000 mg | ORAL_TABLET | Freq: Every day | ORAL | 1 refills | Status: DC

## 2022-10-10 MED ORDER — HYDROCHLOROTHIAZIDE 25 MG PO TABS: 25.0000 mg | ORAL_TABLET | Freq: Every day | ORAL | 1 refills | Status: DC

## 2022-10-10 MED ORDER — NITROFURANTOIN MONOHYD MACRO 100 MG PO CAPS: 100.0000 mg | ORAL_CAPSULE | Freq: Two times a day (BID) | ORAL | 0 refills | Status: AC

## 2022-10-10 NOTE — Addendum Note (Signed)
Addended by: Fredderick Severance on: 10/10/2022 03:12 PM   Modules accepted: Orders

## 2022-10-10 NOTE — Progress Notes (Signed)
Date:  10/10/2022   Name:  Jo-Anne Kluth [facility]   DOB:  02-Jul-1961   MRN:  [id number]   Chief Complaint: Flu Vaccine, Hypertension, and Hyperlipidemia  Hypertension This is a chronic problem. The current episode started more than 1 year ago. The problem has been gradually improving since onset. Pertinent negatives include no chest pain, headaches, orthopnea, palpitations, peripheral edema, PND or shortness of breath. Past treatments include ACE inhibitors and diuretics. There is no history of angina, CAD/MI or left ventricular hypertrophy. There is no history of chronic renal disease, a hypertension causing med or renovascular disease.  Hyperlipidemia This is a chronic problem. The current episode started more than 1 year ago. The problem is controlled. Recent lipid tests were reviewed and are normal. Exacerbating diseases include diabetes. She has no history of chronic renal disease, hypothyroidism, liver disease or obesity. Pertinent negatives include no chest pain, focal weakness, myalgias or shortness of breath. Current antihyperlipidemic treatment includes statins. The current treatment provides moderate improvement of lipids. There are no compliance problems.     Lab Results  Component Value Date   NA 139 04/10/2022   K 4.7 04/10/2022   CO2 24 04/10/2022   GLUCOSE 90 04/10/2022   BUN 16 04/10/2022   CREATININE 0.86 04/10/2022   CALCIUM 9.3 04/10/2022   EGFR 78 04/10/2022   GFRNONAA 92 10/14/2018   Lab Results  Component Value Date   CHOL 169 04/10/2022   HDL 51 04/10/2022   LDLCALC 100 (H) 04/10/2022   TRIG 98 04/10/2022   CHOLHDL 3.3 07/15/2021   Lab Results  Component Value Date   TSH 0.852 04/16/2018   Lab Results  Component Value Date   HGBA1C 6.6 04/09/2022   Lab Results  Component Value Date   WBC 7.4 04/05/2018   HGB 14.3 04/05/2018   HCT 41.6 04/05/2018   MCV 87.3 04/05/2018   PLT 178 04/05/2018   Lab Results  Component Value Date   ALT 19 06/21/2020    AST 16 06/21/2020   No results found for: "25OHVITD2", "25OHVITD3", "VD25OH"   Review of Systems  Constitutional:  Positive for unexpected weight change. Negative for appetite change, fatigue and fever.  HENT:  Negative for congestion and trouble swallowing.   Respiratory:  Negative for cough, chest tightness, shortness of breath and wheezing.   Cardiovascular:  Positive for leg swelling. Negative for chest pain, palpitations, orthopnea and PND.  Gastrointestinal:  Negative for abdominal pain and blood in stool.  Genitourinary:  Negative for hematuria and vaginal bleeding.  Musculoskeletal:  Positive for back pain. Negative for myalgias and neck stiffness.  Skin:  Negative for color change.  Neurological:  Negative for focal weakness and headaches.  Hematological:  Negative for adenopathy. Does not bruise/bleed easily.    Patient Active Problem List   Diagnosis Date Noted   Type 2 diabetes mellitus with hyperglycemia, without long-term current use of insulin ([place]) 09/17/2018   Tobacco dependence 09/17/2018   Special screening for malignant neoplasms, colon    Benign neoplasm of transverse colon    Polyp of sigmoid colon    Rectal polyp    Hyperglycemia 04/16/2018   Essential hypertension 04/16/2018   Venous insufficiency 04/16/2018   Abnormal weight gain 04/16/2018    No Known Allergies  Past Surgical History:  Procedure Laterality Date   BREAST BIOPSY Right    neg bx/clip   CHOLECYSTECTOMY     COLON SURGERY     COLONOSCOPY WITH PROPOFOL N/A 08/23/2018  Procedure: COLONOSCOPY WITH PROPOFOL with biopsies;  Surgeon: Lucilla Lame, MD;  Location: [place];  Service: Endoscopy;  Laterality: N/A;  DIABETIC   COLOSTOMY REVERSAL     KNEE SURGERY     POLYPECTOMY N/A 08/23/2018   Procedure: POLYPECTOMY;  Surgeon: Lucilla Lame, MD;  Location: [place];  Service: Endoscopy;  Laterality: N/A;   TONSILLECTOMY     TUBAL LIGATION      Social History   Tobacco  Use   Smoking status: Every Day    Packs/day: 0.50    Years: 25.00    Total pack years: 12.50    Types: Cigarettes   Smokeless tobacco: Never  Vaping Use   Vaping Use: Never used  Substance Use Topics   Alcohol use: Never   Drug use: Never     Medication list has been reviewed and updated.  Current Meds  Medication Sig   atorvastatin (LIPITOR) 10 MG tablet TAKE 1 TABLET BY MOUTH ONCE EVERY EVENING   DULoxetine (CYMBALTA) 30 MG capsule TK ONE C PO ONCE A DAY FOR 90 DAYS- pain management   fluticasone (FLONASE) 50 MCG/ACT nasal spray Place 2 sprays into both nostrils daily.   gabapentin (NEURONTIN) 600 MG tablet TK 1 T PO BID- pain management   glucose blood test strip 1 each by Other route 2 (two) times daily.   hydrochlorothiazide (HYDRODIURIL) 25 MG tablet TAKE 1 TABLET BY MOUTH ONCE DAILY   Lancets (ONETOUCH ULTRASOFT) lancets 1 each by Other route 2 (two) times daily.   lisinopril (ZESTRIL) 10 MG tablet TAKE 1 TABLET BY MOUTH ONCE DAILY   morphine (MS CONTIN) 15 MG 12 hr tablet TK 1 T PO  Q 8 H FOR 30 DAYS- pain management   naproxen sodium (ALEVE) 220 MG tablet Take 220 mg by mouth every 12 (twelve) hours as needed.   NARCAN 4 MG/0.1ML LIQD nasal spray kit NAR REP ALN   Oxycodone HCl 10 MG TABS Take 10 mg by mouth 4 (four) times daily as needed. Pain clinic   SYNJARDY XR 12.04-999 MG TB24 Take 2 tablets by mouth daily. [facility]   tiZANidine (ZANAFLEX) 4 MG tablet TK 1 T PO Q 8 H PRF SPASMS- pain management       10/10/2022    2:07 PM 08/15/2022    3:18 PM 04/10/2022    3:09 PM 10/10/2021    9:21 AM  GAD 7 : Generalized Anxiety Score  Nervous, Anxious, on Edge 0 0 0 0  Control/stop worrying 0 0 0 0  Worry too much - different things 0 0 0 0  Trouble relaxing 0 0 0 0  Restless 0 0 0 0  Easily annoyed or irritable 0 0 0 0  Afraid - awful might happen 0 0 0 0  Total GAD 7 Score 0 0 0 0  Anxiety Difficulty Not difficult at all Not difficult at all Not difficult at all         10/10/2022    2:07 PM 08/15/2022    3:18 PM 04/10/2022    3:08 PM  Depression screen PHQ 2/9  Decreased Interest 0 0 0  Down, Depressed, Hopeless 0 0 0  PHQ - 2 Score 0 0 0  Altered sleeping 0 1 0  Tired, decreased energy 0 1 1  Change in appetite 0 0 1  Feeling bad or failure about yourself  0 0 1  Trouble concentrating 0 0 0  Moving slowly or fidgety/restless 0 0 0  Suicidal thoughts 0 0 0  PHQ-9 Score 0 2 3  Difficult doing work/chores Not difficult at all Not difficult at all Not difficult at all    BP Readings from Last 3 Encounters:  10/10/22 122/78  08/15/22 118/68  04/10/22 110/80    Physical Exam Vitals and nursing note reviewed.  Constitutional:      Appearance: She is well-developed.  HENT:     Head: Normocephalic.     Right Ear: Tympanic membrane and external ear normal.     Left Ear: Tympanic membrane and external ear normal.     Nose: Nose normal.     Mouth/Throat:     Mouth: Mucous membranes are moist.  Eyes:     General: Lids are everted, no foreign bodies appreciated. No scleral icterus.       Left eye: No foreign body or hordeolum.     Conjunctiva/sclera: Conjunctivae normal.     Right eye: Right conjunctiva is not injected.     Left eye: Left conjunctiva is not injected.     Pupils: Pupils are equal, round, and reactive to light.  Neck:     Thyroid: No thyromegaly.     Vascular: No JVD.     Trachea: No tracheal deviation.  Cardiovascular:     Rate and Rhythm: Normal rate and regular rhythm.     Heart sounds: Normal heart sounds. No murmur heard.    No friction rub. No gallop.  Pulmonary:     Effort: Pulmonary effort is normal. No respiratory distress.     Breath sounds: Normal breath sounds. No wheezing, rhonchi or rales.  Abdominal:     General: Bowel sounds are normal.     Palpations: Abdomen is soft. There is no mass.     Tenderness: There is no abdominal tenderness. There is no guarding or rebound.  Musculoskeletal:         General: No tenderness. Normal range of motion.     Cervical back: Normal range of motion and neck supple.  Lymphadenopathy:     Cervical: No cervical adenopathy.  Skin:    General: Skin is warm.     Findings: No rash.  Neurological:     Mental Status: She is alert and oriented to person, place, and time.     Cranial Nerves: No cranial nerve deficit.     Deep Tendon Reflexes: Reflexes normal.  Psychiatric:        Mood and Affect: Mood is not anxious or depressed.     Wt Readings from Last 3 Encounters:  10/10/22 185 lb (83.9 kg)  08/15/22 192 lb (87.1 kg)  04/10/22 201 lb (91.2 kg)    BP 122/78 (BP Location: Right Arm, Cuff Size: Large)   Pulse 88   Ht _0  (1.549 m)   Wt 185 lb (83.9 kg)   SpO2 96%   BMI 34.96 kg/m   Assessment and Plan:  1. Essential hypertension Chronic.  Controlled.  Stable.  Blood pressure 122/78.  Continue hydrochlorothiazide 25 mg once a day and lisinopril 10 mg once a day.  Will check CMP for electrolytes and GFR.  We will recheck patient in 6 months. - hydrochlorothiazide (HYDRODIURIL) 25 MG tablet; Take 1 tablet (25 mg total) by mouth daily.  Dispense: 90 tablet; Refill: 1 - lisinopril (ZESTRIL) 10 MG tablet; Take 1 tablet (10 mg total) by mouth daily.  Dispense: 90 tablet; Refill: 1 - Comprehensive Metabolic Panel (CMET)  2. Familial hypercholesterolemia Chronic.  Controlled.  Stable.  Continue atorvastatin 10 mg once a day.  Will check LDL for current status. - atorvastatin (LIPITOR) 10 MG tablet; TAKE 1 TABLET BY MOUTH ONCE EVERY EVENING  Dispense: 90 tablet; Refill: 1 - Direct LDL  3. Type 2 diabetes mellitus with hyperglycemia, without long-term current use of insulin ([place]) .  Controlled.  Followed by endocrinology.  We will check A1c for documentation and as well as microalbuminuria. - HgB A1c - Microalbumin / creatinine urine ratio  4. Cystitis New onset.  Asymptomatic.  Stable.  Leukocytes with minimal erythrocytes.  Continue  Macrobid 100 mg twice a day. - nitrofurantoin, macrocrystal-monohydrate, (MACROBID) 100 MG capsule; Take 1 capsule (100 mg total) by mouth 2 (two) times daily for 7 days.  Dispense: 6 capsule; Refill: 0  5. Weight loss New onset.  Episodic.  Patient continues to lose weight with no symptomatology.  No blood has been noted for many aspect, no pain of any area, no shortness of breath.  We will check a TSH and CBC.  Patient will continue to monitor weight and we will adjust accordingly. - TSH - CBC with Differential/Platelet  6. Tobacco dependence Patient has been advised of the health risks of smoking and counseled concerning cessation of tobacco products. I spent over 3 minutes for discussion and to answer questions.     Otilio Miu, MD

## 2022-10-12 LAB — COMPREHENSIVE METABOLIC PANEL
ALT: 17 IU/L (ref 0–32)
AST: 17 IU/L (ref 0–40)
Albumin/Globulin Ratio: 1.7 (ref 1.2–2.2)
Albumin: 4 g/dL (ref 3.8–4.9)
Alkaline Phosphatase: 53 IU/L (ref 44–121)
BUN/Creatinine Ratio: 13 (ref 12–28)
BUN: 11 mg/dL (ref 8–27)
Bilirubin Total: <0.2 mg/dL (ref 0.0–1.2)
CO2: 22 mmol/L (ref 20–29)
Calcium: 9.1 mg/dL (ref 8.7–10.3)
Chloride: 104 mmol/L (ref 96–106)
Creatinine, Ser: 0.82 mg/dL (ref 0.57–1.00)
Globulin, Total: 2.3 g/dL (ref 1.5–4.5)
Glucose: 93 mg/dL (ref 70–99)
Potassium: 4.6 mmol/L (ref 3.5–5.2)
Sodium: 140 mmol/L (ref 134–144)
Total Protein: 6.3 g/dL (ref 6.0–8.5)
eGFR: 82 mL/min/{1.73_m2} (ref >59)

## 2022-10-12 LAB — CBC WITH DIFFERENTIAL/PLATELET
Basophils Absolute: 0.1 10*3/uL (ref 0.0–0.2)
Basos: 1 %
EOS (ABSOLUTE): 0.2 10*3/uL (ref 0.0–0.4)
Eos: 3 %
Hematocrit: 41.3 % (ref 34.0–46.6)
Hemoglobin: 14 g/dL (ref 11.1–15.9)
Immature Grans (Abs): 0 10*3/uL (ref 0.0–0.1)
Immature Granulocytes: 0 %
Lymphocytes Absolute: 1.8 10*3/uL (ref 0.7–3.1)
Lymphs: 24 %
MCH: 30 pg (ref 26.6–33.0)
MCHC: 33.9 g/dL (ref 31.5–35.7)
MCV: 89 fL (ref 79–97)
Monocytes Absolute: 0.6 10*3/uL (ref 0.1–0.9)
Monocytes: 8 %
Neutrophils Absolute: 4.8 10*3/uL (ref 1.4–7.0)
Neutrophils: 64 %
Platelets: 227 10*3/uL (ref 150–450)
RBC: 4.66 x10E6/uL (ref 3.77–5.28)
RDW: 12.7 % (ref 11.7–15.4)
WBC: 7.3 10*3/uL (ref 3.4–10.8)

## 2022-10-12 LAB — MICROALBUMIN / CREATININE URINE RATIO
Creatinine, Urine: 151.6 mg/dL
Microalb/Creat Ratio: 10 mg/g creat (ref 0–29)
Microalbumin, Urine: 15.5 ug/mL

## 2022-10-12 LAB — HEMOGLOBIN A1C
Est. average glucose Bld gHb Est-mCnc: 114 mg/dL
Hgb A1c MFr Bld: 5.6 % (ref 4.8–5.6)

## 2022-10-12 LAB — TSH: TSH: 1.07 u[IU]/mL (ref 0.450–4.500)

## 2022-10-12 LAB — LDL CHOLESTEROL, DIRECT: LDL Direct: 97 mg/dL (ref 0–99)

## 2022-10-15 DIAGNOSIS — I152 Hypertension secondary to endocrine disorders: Secondary | ICD-10-CM | POA: Diagnosis not present

## 2022-10-15 DIAGNOSIS — E1169 Type 2 diabetes mellitus with other specified complication: Secondary | ICD-10-CM | POA: Diagnosis not present

## 2022-10-15 DIAGNOSIS — E1129 Type 2 diabetes mellitus with other diabetic kidney complication: Secondary | ICD-10-CM | POA: Diagnosis not present

## 2022-10-15 DIAGNOSIS — R809 Proteinuria, unspecified: Secondary | ICD-10-CM | POA: Diagnosis not present

## 2022-10-15 DIAGNOSIS — E785 Hyperlipidemia, unspecified: Secondary | ICD-10-CM | POA: Diagnosis not present

## 2022-10-15 DIAGNOSIS — F17200 Nicotine dependence, unspecified, uncomplicated: Secondary | ICD-10-CM | POA: Diagnosis not present

## 2022-10-15 DIAGNOSIS — E1159 Type 2 diabetes mellitus with other circulatory complications: Secondary | ICD-10-CM | POA: Diagnosis not present

## 2022-10-16 DIAGNOSIS — M25561 Pain in right knee: Secondary | ICD-10-CM | POA: Diagnosis not present

## 2022-10-16 DIAGNOSIS — M4716 Other spondylosis with myelopathy, lumbar region: Secondary | ICD-10-CM | POA: Diagnosis not present

## 2022-10-16 DIAGNOSIS — M25562 Pain in left knee: Secondary | ICD-10-CM | POA: Diagnosis not present

## 2022-10-16 DIAGNOSIS — M5116 Intervertebral disc disorders with radiculopathy, lumbar region: Secondary | ICD-10-CM | POA: Diagnosis not present

## 2022-10-16 DIAGNOSIS — G894 Chronic pain syndrome: Secondary | ICD-10-CM | POA: Diagnosis not present

## 2022-10-16 DIAGNOSIS — Z79899 Other long term (current) drug therapy: Secondary | ICD-10-CM | POA: Diagnosis not present

## 2022-11-11 ENCOUNTER — Other Ambulatory Visit: Payer: Self-pay

## 2022-11-11 DIAGNOSIS — R634 Abnormal weight loss: Secondary | ICD-10-CM

## 2022-11-18 ENCOUNTER — Ambulatory Visit: Payer: PPO

## 2022-11-18 ENCOUNTER — Ambulatory Visit
Admission: RE | Admit: 2022-11-18 | Discharge: 2022-11-18 | Disposition: A | Discharge status: Home / Self Care | Payer: PPO | Source: Ambulatory Visit | Attending: Family Medicine | Admitting: Family Medicine

## 2022-11-18 DIAGNOSIS — J439 Emphysema, unspecified: Secondary | ICD-10-CM | POA: Diagnosis not present

## 2022-11-18 DIAGNOSIS — R634 Abnormal weight loss: Secondary | ICD-10-CM | POA: Diagnosis not present

## 2022-11-18 DIAGNOSIS — I700 Atherosclerosis of aorta: Secondary | ICD-10-CM | POA: Diagnosis not present

## 2022-11-18 LAB — POCT I-STAT CREATININE: Creatinine, Ser: 0.7 mg/dL (ref 0.44–1.00)

## 2022-11-18 MED ORDER — IOHEXOL 300 MG/ML  SOLN
75.0000 mL | Freq: Once | INTRAMUSCULAR | Status: AC | PRN
  Administered 2022-11-18: 75 mL via INTRAVENOUS

## 2022-12-18 DIAGNOSIS — Z79891 Long term (current) use of opiate analgesic: Secondary | ICD-10-CM | POA: Diagnosis not present

## 2022-12-18 DIAGNOSIS — M25561 Pain in right knee: Secondary | ICD-10-CM | POA: Diagnosis not present

## 2022-12-18 DIAGNOSIS — M4716 Other spondylosis with myelopathy, lumbar region: Secondary | ICD-10-CM | POA: Diagnosis not present

## 2022-12-18 DIAGNOSIS — M25562 Pain in left knee: Secondary | ICD-10-CM | POA: Diagnosis not present

## 2022-12-18 DIAGNOSIS — G894 Chronic pain syndrome: Secondary | ICD-10-CM | POA: Diagnosis not present

## 2022-12-18 DIAGNOSIS — Z79899 Other long term (current) drug therapy: Secondary | ICD-10-CM | POA: Diagnosis not present

## 2022-12-18 DIAGNOSIS — M5116 Intervertebral disc disorders with radiculopathy, lumbar region: Secondary | ICD-10-CM | POA: Diagnosis not present

## 2023-02-12 DIAGNOSIS — M25561 Pain in right knee: Secondary | ICD-10-CM | POA: Diagnosis not present

## 2023-02-12 DIAGNOSIS — M4716 Other spondylosis with myelopathy, lumbar region: Secondary | ICD-10-CM | POA: Diagnosis not present

## 2023-02-12 DIAGNOSIS — G894 Chronic pain syndrome: Secondary | ICD-10-CM | POA: Diagnosis not present

## 2023-02-12 DIAGNOSIS — Z79891 Long term (current) use of opiate analgesic: Secondary | ICD-10-CM | POA: Diagnosis not present

## 2023-02-12 DIAGNOSIS — M25562 Pain in left knee: Secondary | ICD-10-CM | POA: Diagnosis not present

## 2023-02-12 DIAGNOSIS — M5116 Intervertebral disc disorders with radiculopathy, lumbar region: Secondary | ICD-10-CM | POA: Diagnosis not present

## 2023-02-12 DIAGNOSIS — Z79899 Other long term (current) drug therapy: Secondary | ICD-10-CM | POA: Diagnosis not present

## 2023-04-09 DIAGNOSIS — M4716 Other spondylosis with myelopathy, lumbar region: Secondary | ICD-10-CM | POA: Diagnosis not present

## 2023-04-09 DIAGNOSIS — M25562 Pain in left knee: Secondary | ICD-10-CM | POA: Diagnosis not present

## 2023-04-09 DIAGNOSIS — M5116 Intervertebral disc disorders with radiculopathy, lumbar region: Secondary | ICD-10-CM | POA: Diagnosis not present

## 2023-04-09 DIAGNOSIS — Z79899 Other long term (current) drug therapy: Secondary | ICD-10-CM | POA: Diagnosis not present

## 2023-04-09 DIAGNOSIS — G894 Chronic pain syndrome: Secondary | ICD-10-CM | POA: Diagnosis not present

## 2023-04-09 DIAGNOSIS — Z79891 Long term (current) use of opiate analgesic: Secondary | ICD-10-CM | POA: Diagnosis not present

## 2023-04-09 DIAGNOSIS — M25561 Pain in right knee: Secondary | ICD-10-CM | POA: Diagnosis not present

## 2023-04-13 ENCOUNTER — Encounter: Payer: Self-pay | Admitting: Family Medicine

## 2023-04-13 ENCOUNTER — Ambulatory Visit (INDEPENDENT_AMBULATORY_CARE_PROVIDER_SITE_OTHER): Payer: PPO | Admitting: Family Medicine

## 2023-04-13 VITALS — BP 120/76 | HR 82 | Ht 61.0 in | Wt 180.0 lb

## 2023-04-13 DIAGNOSIS — E7801 Familial hypercholesterolemia: Secondary | ICD-10-CM | POA: Diagnosis not present

## 2023-04-13 DIAGNOSIS — I10 Essential (primary) hypertension: Secondary | ICD-10-CM

## 2023-04-13 MED ORDER — LISINOPRIL 10 MG PO TABS: 10.0000 mg | ORAL_TABLET | Freq: Every day | ORAL | 1 refills | Status: DC

## 2023-04-13 MED ORDER — HYDROCHLOROTHIAZIDE 25 MG PO TABS: 25.0000 mg | ORAL_TABLET | Freq: Every day | ORAL | 1 refills | Status: DC

## 2023-04-13 MED ORDER — ATORVASTATIN CALCIUM 10 MG PO TABS: ORAL_TABLET | ORAL | 1 refills | Status: DC

## 2023-04-13 NOTE — Progress Notes (Signed)
Date:  04/13/2023   Name:  Erica Ruiz [facility]   DOB:  10-10-1961   MRN:  [id number]   Chief Complaint: Hyperlipidemia and Hypertension  Hyperlipidemia This is a chronic problem. The current episode started more than 1 year ago. The problem is controlled. Recent lipid tests were reviewed and are normal. She has no history of chronic renal disease, diabetes, hypothyroidism, liver disease, obesity or nephrotic syndrome. There are no known factors aggravating her hyperlipidemia. Pertinent negatives include no chest pain, focal weakness or shortness of breath. Current antihyperlipidemic treatment includes statins. The current treatment provides moderate improvement of lipids. There are no compliance problems.   Hypertension This is a chronic problem. The current episode started more than 1 year ago. The problem has been gradually improving since onset. The problem is controlled. Pertinent negatives include no anxiety, blurred vision, chest pain, headaches, orthopnea, palpitations, PND or shortness of breath. There are no associated agents to hypertension. Risk factors for coronary artery disease include dyslipidemia. Past treatments include ACE inhibitors and diuretics. The current treatment provides moderate improvement. There are no compliance problems.  There is no history of CAD/MI or CVA. There is no history of chronic renal disease, a hypertension causing med or renovascular disease.    Lab Results  Component Value Date   NA 140 10/10/2022   K 4.6 10/10/2022   CO2 22 10/10/2022   GLUCOSE 93 10/10/2022   BUN 11 10/10/2022   CREATININE 0.70 11/18/2022   CALCIUM 9.1 10/10/2022   EGFR 82 10/10/2022   GFRNONAA 92 10/14/2018   Lab Results  Component Value Date   CHOL 169 04/10/2022   HDL 51 04/10/2022   LDLCALC 100 (H) 04/10/2022   LDLDIRECT 97 10/10/2022   TRIG 98 04/10/2022   CHOLHDL 3.3 07/15/2021   Lab Results  Component Value Date   TSH 1.070 10/10/2022   Lab Results   Component Value Date   HGBA1C 5.6 10/10/2022   Lab Results  Component Value Date   WBC 7.3 10/10/2022   HGB 14.0 10/10/2022   HCT 41.3 10/10/2022   MCV 89 10/10/2022   PLT 227 10/10/2022   Lab Results  Component Value Date   ALT 17 10/10/2022   AST 17 10/10/2022   ALKPHOS 53 10/10/2022   BILITOT <0.2 10/10/2022   No results found for: "25OHVITD2", "25OHVITD3", "VD25OH"   Review of Systems  Eyes:  Negative for blurred vision, redness and visual disturbance.  Respiratory:  Negative for cough, choking, shortness of breath, wheezing and stridor.   Cardiovascular:  Negative for chest pain, palpitations, orthopnea, leg swelling and PND.  Gastrointestinal:  Negative for abdominal distention, abdominal pain and blood in stool.  Endocrine: Negative for polydipsia and polyuria.  Genitourinary:  Negative for difficulty urinating and vaginal bleeding.  Skin:  Negative for color change.  Neurological:  Negative for focal weakness and headaches.    Patient Active Problem List   Diagnosis Date Noted   Type 2 diabetes mellitus with hyperglycemia, without long-term current use of insulin ([facility]) 09/17/2018   Tobacco dependence 09/17/2018   Special screening for malignant neoplasms, colon    Benign neoplasm of transverse colon    Polyp of sigmoid colon    Rectal polyp    Hyperglycemia 04/16/2018   Essential hypertension 04/16/2018   Venous insufficiency 04/16/2018   Abnormal weight gain 04/16/2018    No Known Allergies  Past Surgical History:  Procedure Laterality Date   BREAST BIOPSY Right    neg bx/clip  CHOLECYSTECTOMY     COLON SURGERY     COLONOSCOPY WITH PROPOFOL N/A 08/23/2018   Procedure: COLONOSCOPY WITH PROPOFOL with biopsies;  Surgeon: Midge Minium, MD;  Location: [facility];  Service: Endoscopy;  Laterality: N/A;  DIABETIC   COLOSTOMY REVERSAL     KNEE SURGERY     POLYPECTOMY N/A 08/23/2018   Procedure: POLYPECTOMY;  Surgeon: Midge Minium, MD;  Location:  [facility];  Service: Endoscopy;  Laterality: N/A;   TONSILLECTOMY     TUBAL LIGATION      Social History   Tobacco Use   Smoking status: Every Day    Packs/day: 0.50    Years: 25.00    Additional pack years: 0.00    Total pack years: 12.50    Types: Cigarettes   Smokeless tobacco: Never  Vaping Use   Vaping Use: Never used  Substance Use Topics   Alcohol use: Never   Drug use: Never     Medication list has been reviewed and updated.  Current Meds  Medication Sig   atorvastatin (LIPITOR) 10 MG tablet TAKE 1 TABLET BY MOUTH ONCE EVERY EVENING   DULoxetine (CYMBALTA) 30 MG capsule TK ONE C PO ONCE A DAY FOR 90 DAYS- pain management   fluticasone (FLONASE) 50 MCG/ACT nasal spray Place 2 sprays into both nostrils daily.   gabapentin (NEURONTIN) 600 MG tablet TK 1 T PO BID- pain management   glucose blood test strip 1 each by Other route 2 (two) times daily.   hydrochlorothiazide (HYDRODIURIL) 25 MG tablet Take 1 tablet (25 mg total) by mouth daily.   Lancets (ONETOUCH ULTRASOFT) lancets 1 each by Other route 2 (two) times daily.   lisinopril (ZESTRIL) 10 MG tablet Take 1 tablet (10 mg total) by mouth daily.   morphine (MS CONTIN) 15 MG 12 hr tablet TK 1 T PO  Q 8 H FOR 30 DAYS- pain management   naproxen sodium (ALEVE) 220 MG tablet Take 220 mg by mouth every 12 (twelve) hours as needed.   NARCAN 4 MG/0.1ML LIQD nasal spray kit NAR REP ALN   Oxycodone HCl 10 MG TABS Take 10 mg by mouth 4 (four) times daily as needed. Pain clinic   SYNJARDY XR 12.04-999 MG TB24 Take 2 tablets by mouth daily. [facility]   tiZANidine (ZANAFLEX) 4 MG tablet TK 1 T PO Q 8 H PRF SPASMS- pain management       04/13/2023    2:02 PM 10/10/2022    2:07 PM 08/15/2022    3:18 PM 04/10/2022    3:09 PM  GAD 7 : Generalized Anxiety Score  Nervous, Anxious, on Edge  0 0 0  Control/stop worrying 0 0 0 0  Worry too much - different things 0 0 0 0  Trouble relaxing 0 0 0 0  Restless 0 0 0 0   Easily annoyed or irritable 0 0 0 0  Afraid - awful might happen 0 0 0 0  Total GAD 7 Score  0 0 0  Anxiety Difficulty Not difficult at all Not difficult at all Not difficult at all Not difficult at all       04/13/2023    2:01 PM 10/10/2022    2:07 PM 08/15/2022    3:18 PM  Depression screen PHQ 2/9  Decreased Interest 0 0 0  Down, Depressed, Hopeless 0 0 0  PHQ - 2 Score 0 0 0  Altered sleeping 0 0 1  Tired, decreased energy 0 0 1  Change in appetite 0 0 0  Feeling bad or failure about yourself  0 0 0  Trouble concentrating 0 0 0  Moving slowly or fidgety/restless 0 0 0  Suicidal thoughts 0 0 0  PHQ-9 Score 0 0 2  Difficult doing work/chores Not difficult at all Not difficult at all Not difficult at all    BP Readings from Last 3 Encounters:  04/13/23 120/76  10/10/22 122/78  08/15/22 118/68    Physical Exam Vitals and nursing note reviewed. Exam conducted with a chaperone present.  Constitutional:      General: She is not in acute distress.    Appearance: She is not diaphoretic.  HENT:     Head: Normocephalic and atraumatic.     Right Ear: Tympanic membrane and external ear normal.     Left Ear: Tympanic membrane and external ear normal.     Nose: Nose normal.  Eyes:     General:        Right eye: No discharge.        Left eye: No discharge.     Conjunctiva/sclera: Conjunctivae normal.     Pupils: Pupils are equal, round, and reactive to light.  Neck:     Thyroid: No thyromegaly.     Vascular: No JVD.  Cardiovascular:     Rate and Rhythm: Normal rate and regular rhythm.     Heart sounds: Normal heart sounds. No murmur heard.    No friction rub. No gallop.  Pulmonary:     Effort: Pulmonary effort is normal.     Breath sounds: Normal breath sounds. No wheezing, rhonchi or rales.  Abdominal:     General: Bowel sounds are normal.     Palpations: Abdomen is soft. There is no mass.     Tenderness: There is no abdominal tenderness. There is no guarding.   Musculoskeletal:        General: Normal range of motion.     Cervical back: Normal range of motion and neck supple.  Lymphadenopathy:     Cervical: No cervical adenopathy.  Skin:    General: Skin is warm and dry.  Neurological:     Mental Status: She is alert.     Deep Tendon Reflexes: Reflexes are normal and symmetric.     Wt Readings from Last 3 Encounters:  04/13/23 180 lb (81.6 kg)  10/10/22 185 lb (83.9 kg)  08/15/22 192 lb (87.1 kg)    BP 120/76   Pulse 82   Ht 5\' 1"  (1.549 m)   Wt 180 lb (81.6 kg)   SpO2 94%   BMI 34.01 kg/m   Assessment and Plan: 1. Essential hypertension Chronic.  Controlled.  Stable.  Blood pressure 120/76.  Asymptomatic.  Tolerating medications well.  Will continue hydrochlorothiazide 25 mg once a day and lisinopril 10 mg once a day.  Will check CMP for electrolytes and GFR.  Will recheck patient in 6 months. - hydrochlorothiazide (HYDRODIURIL) 25 MG tablet; Take 1 tablet (25 mg total) by mouth daily.  Dispense: 90 tablet; Refill: 1 - lisinopril (ZESTRIL) 10 MG tablet; Take 1 tablet (10 mg total) by mouth daily.  Dispense: 90 tablet; Refill: 1 - Comprehensive Metabolic Panel (CMET)  2. Familial hypercholesterolemia Chronic.  Controlled.  Stable.  Continue atorvastatin 10 mg once a day.  Will check lipid panel for current status of LDL control.  Reemphasized diet as usual. - atorvastatin (LIPITOR) 10 MG tablet; TAKE 1 TABLET BY MOUTH ONCE EVERY EVENING  Dispense: 90  tablet; Refill: 1 - Lipid Panel With LDL/HDL Ratio     Elizabeth Sauer, MD

## 2023-04-13 NOTE — Patient Instructions (Signed)

## 2023-04-14 LAB — COMPREHENSIVE METABOLIC PANEL
ALT: 11 IU/L (ref 0–32)
AST: 14 IU/L (ref 0–40)
Albumin/Globulin Ratio: 1.6 (ref 1.2–2.2)
Albumin: 4 g/dL (ref 3.8–4.9)
Alkaline Phosphatase: 58 IU/L (ref 44–121)
BUN/Creatinine Ratio: 13 (ref 12–28)
BUN: 11 mg/dL (ref 8–27)
Bilirubin Total: <0.2 mg/dL (ref 0.0–1.2)
CO2: 25 mmol/L (ref 20–29)
Calcium: 9.4 mg/dL (ref 8.7–10.3)
Chloride: 102 mmol/L (ref 96–106)
Creatinine, Ser: 0.88 mg/dL (ref 0.57–1.00)
Globulin, Total: 2.5 g/dL (ref 1.5–4.5)
Glucose: 96 mg/dL (ref 70–99)
Potassium: 4.5 mmol/L (ref 3.5–5.2)
Sodium: 139 mmol/L (ref 134–144)
Total Protein: 6.5 g/dL (ref 6.0–8.5)
eGFR: 75 mL/min/{1.73_m2} (ref >59)

## 2023-04-14 LAB — LIPID PANEL WITH LDL/HDL RATIO
Cholesterol, Total: 165 mg/dL (ref 100–199)
HDL: 54 mg/dL (ref >39)
LDL Chol Calc (NIH): 92 mg/dL (ref 0–99)
LDL/HDL Ratio: 1.7 ratio (ref 0.0–3.2)
Triglycerides: 107 mg/dL (ref 0–149)
VLDL Cholesterol Cal: 19 mg/dL (ref 5–40)

## 2023-04-22 DIAGNOSIS — E1169 Type 2 diabetes mellitus with other specified complication: Secondary | ICD-10-CM | POA: Diagnosis not present

## 2023-04-22 DIAGNOSIS — E1159 Type 2 diabetes mellitus with other circulatory complications: Secondary | ICD-10-CM | POA: Diagnosis not present

## 2023-04-22 DIAGNOSIS — F17200 Nicotine dependence, unspecified, uncomplicated: Secondary | ICD-10-CM | POA: Diagnosis not present

## 2023-04-22 DIAGNOSIS — R809 Proteinuria, unspecified: Secondary | ICD-10-CM | POA: Diagnosis not present

## 2023-04-22 DIAGNOSIS — E785 Hyperlipidemia, unspecified: Secondary | ICD-10-CM | POA: Diagnosis not present

## 2023-04-22 DIAGNOSIS — E1129 Type 2 diabetes mellitus with other diabetic kidney complication: Secondary | ICD-10-CM | POA: Diagnosis not present

## 2023-04-22 DIAGNOSIS — I152 Hypertension secondary to endocrine disorders: Secondary | ICD-10-CM | POA: Diagnosis not present

## 2023-04-22 DIAGNOSIS — Z79899 Other long term (current) drug therapy: Secondary | ICD-10-CM | POA: Diagnosis not present

## 2023-04-22 LAB — HEMOGLOBIN A1C: Hemoglobin A1C: 6.6

## 2023-06-03 DIAGNOSIS — E119 Type 2 diabetes mellitus without complications: Secondary | ICD-10-CM | POA: Diagnosis not present

## 2023-06-03 DIAGNOSIS — H2513 Age-related nuclear cataract, bilateral: Secondary | ICD-10-CM | POA: Diagnosis not present

## 2023-06-03 DIAGNOSIS — H40013 Open angle with borderline findings, low risk, bilateral: Secondary | ICD-10-CM | POA: Diagnosis not present

## 2023-06-25 DIAGNOSIS — M25561 Pain in right knee: Secondary | ICD-10-CM | POA: Diagnosis not present

## 2023-06-25 DIAGNOSIS — M4716 Other spondylosis with myelopathy, lumbar region: Secondary | ICD-10-CM | POA: Diagnosis not present

## 2023-06-25 DIAGNOSIS — Z79899 Other long term (current) drug therapy: Secondary | ICD-10-CM | POA: Diagnosis not present

## 2023-06-25 DIAGNOSIS — M5116 Intervertebral disc disorders with radiculopathy, lumbar region: Secondary | ICD-10-CM | POA: Diagnosis not present

## 2023-06-25 DIAGNOSIS — M25562 Pain in left knee: Secondary | ICD-10-CM | POA: Diagnosis not present

## 2023-06-25 DIAGNOSIS — G894 Chronic pain syndrome: Secondary | ICD-10-CM | POA: Diagnosis not present

## 2023-08-20 DIAGNOSIS — G894 Chronic pain syndrome: Secondary | ICD-10-CM | POA: Diagnosis not present

## 2023-08-20 DIAGNOSIS — Z79899 Other long term (current) drug therapy: Secondary | ICD-10-CM | POA: Diagnosis not present

## 2023-08-20 DIAGNOSIS — M25561 Pain in right knee: Secondary | ICD-10-CM | POA: Diagnosis not present

## 2023-08-20 DIAGNOSIS — M4716 Other spondylosis with myelopathy, lumbar region: Secondary | ICD-10-CM | POA: Diagnosis not present

## 2023-08-20 DIAGNOSIS — M5116 Intervertebral disc disorders with radiculopathy, lumbar region: Secondary | ICD-10-CM | POA: Diagnosis not present

## 2023-08-20 DIAGNOSIS — M25562 Pain in left knee: Secondary | ICD-10-CM | POA: Diagnosis not present

## 2023-08-27 ENCOUNTER — Encounter: Payer: Self-pay | Admitting: Family Medicine

## 2023-09-22 ENCOUNTER — Ambulatory Visit: Payer: PPO

## 2023-09-22 DIAGNOSIS — Z1231 Encounter for screening mammogram for malignant neoplasm of breast: Secondary | ICD-10-CM

## 2023-09-22 DIAGNOSIS — Z1211 Encounter for screening for malignant neoplasm of colon: Secondary | ICD-10-CM | POA: Diagnosis not present

## 2023-09-22 DIAGNOSIS — Z0000 Encounter for general adult medical examination without abnormal findings: Secondary | ICD-10-CM | POA: Diagnosis not present

## 2023-09-22 NOTE — Progress Notes (Signed)
Subjective:   Erica Ruiz is a 63 y.o. female who presents for Medicare Annual (Subsequent) preventive examination.  Visit Complete: Virtual I connected with  Virgil Slinger [place] on 09/22/23 by a audio enabled telemedicine application and verified that I am speaking with the correct person using two identifiers.  Patient Location: Home  Provider Location: Office/Clinic  I discussed the limitations of evaluation and management by telemedicine. The patient expressed understanding and agreed to proceed.  Vital Signs: Because this visit was a virtual/telehealth visit, some criteria may be missing or patient reported. Any vitals not documented were not able to be obtained and vitals that have been documented are patient reported.  Cardiac Risk Factors include: advanced age (>79men, >7 women);diabetes mellitus;hypertension;obesity (BMI >30kg/m2);sedentary lifestyle     Objective:    There were no vitals filed for this visit. There is no height or weight on file to calculate BMI.     09/22/2023    2:35 PM 08/07/2021    3:27 PM 08/06/2020    3:39 PM 08/03/2019    3:37 PM 08/23/2018    7:42 AM 07/28/2018    1:57 PM 06/28/2018    4:29 PM  Advanced Directives  Does Patient Have a Medical Advance Directive? No No No No No No No  Would patient like information on creating a medical advance directive? No - Patient declined No - Patient declined No - Patient declined Yes (MAU/Ambulatory/Procedural Areas - Information given) No - Patient declined Yes (MAU/Ambulatory/Procedural Areas - Information given) No - Patient declined    Current Medications (verified) Outpatient Encounter Medications as of 09/22/2023  Medication Sig   atorvastatin (LIPITOR) 10 MG tablet TAKE 1 TABLET BY MOUTH ONCE EVERY EVENING   DULoxetine (CYMBALTA) 30 MG capsule TK ONE C PO ONCE A DAY FOR 90 DAYS- pain management   fluticasone (FLONASE) 50 MCG/ACT nasal spray Place 2 sprays into both nostrils daily.   gabapentin  (NEURONTIN) 600 MG tablet TK 1 T PO BID- pain management   glucose blood test strip 1 each by Other route 2 (two) times daily.   hydrochlorothiazide (HYDRODIURIL) 25 MG tablet Take 1 tablet (25 mg total) by mouth daily.   Lancets (ONETOUCH ULTRASOFT) lancets 1 each by Other route 2 (two) times daily.   lisinopril (ZESTRIL) 10 MG tablet Take 1 tablet (10 mg total) by mouth daily.   morphine (MS CONTIN) 15 MG 12 hr tablet TK 1 T PO  Q 8 H FOR 30 DAYS- pain management   NARCAN 4 MG/0.1ML LIQD nasal spray kit NAR REP ALN   Oxycodone HCl 10 MG TABS Take 10 mg by mouth 4 (four) times daily as needed. Pain clinic   SYNJARDY XR 12.04-999 MG TB24 Take 2 tablets by mouth daily. O'Connell   tiZANidine (ZANAFLEX) 4 MG tablet TK 1 T PO Q 8 H PRF SPASMS- pain management   naproxen sodium (ALEVE) 220 MG tablet Take 220 mg by mouth every 12 (twelve) hours as needed. (Patient not taking: Reported on 09/22/2023)   No facility-administered encounter medications on file as of 09/22/2023.    Allergies (verified) Patient has no known allergies.   History: Past Medical History:  Diagnosis Date   Back pain    Diabetes mellitus without complication (HCC)    Hypertension    Past Surgical History:  Procedure Laterality Date   BREAST BIOPSY Right    neg bx/clip   CHOLECYSTECTOMY     COLON SURGERY     COLONOSCOPY WITH PROPOFOL N/A  08/23/2018   Procedure: COLONOSCOPY WITH PROPOFOL with biopsies;  Surgeon: Midge Minium, MD;  Location: [facility];  Service: Endoscopy;  Laterality: N/A;  DIABETIC   COLOSTOMY REVERSAL     KNEE SURGERY     POLYPECTOMY N/A 08/23/2018   Procedure: POLYPECTOMY;  Surgeon: Midge Minium, MD;  Location: [facility];  Service: Endoscopy;  Laterality: N/A;   TONSILLECTOMY     TUBAL LIGATION     Family History  Problem Relation Age of Onset   Pulmonary embolism Mother    Cancer Father        prostate   Heart disease Father    Hypertension Father    Cancer Sister         non-hodgkin's lymphoma   Hepatitis C Sister    Cancer Brother        colon   Diabetes Brother    Heart disease Brother    Hypertension Brother    Diabetes Daughter    Diabetes Brother    Hypertension Sister    Cancer Brother        prostate   Breast cancer Paternal Aunt    Social History   Socioeconomic History   Marital status: Married    Spouse name: Bobbye Riggs   Number of children: 4   Years of education: Not on file   Highest education level: GED or equivalent  Occupational History   Occupation: Disabled  Tobacco Use   Smoking status: Every Day    Current packs/day: 0.50    Average packs/day: 0.5 packs/day for 25.0 years (12.5 ttl pk-yrs)    Types: Cigarettes   Smokeless tobacco: Never  Vaping Use   Vaping status: Never Used  Substance and Sexual Activity   Alcohol use: Never   Drug use: Never   Sexual activity: Not Currently    Birth control/protection: Spermicide  Other Topics Concern   Not on file  Social History Narrative   ** Merged History Encounter **       Social Determinants of Health   Financial Resource Strain: Low Risk  (09/22/2023)   Overall Financial Resource Strain (CARDIA)    Difficulty of Paying Living Expenses: Not hard at all  Food Insecurity: No Food Insecurity (09/22/2023)   Hunger Vital Sign    Worried About Running Out of Food in the Last Year: Never true    Ran Out of Food in the Last Year: Never true  Transportation Needs: No Transportation Needs (09/22/2023)   PRAPARE - Administrator, Civil Service (Medical): No    Lack of Transportation (Non-Medical): No  Physical Activity: Insufficiently Active (09/22/2023)   Exercise Vital Sign    Days of Exercise per Week: 2 days    Minutes of Exercise per Session: 20 min  Stress: No Stress Concern Present (09/22/2023)   [employer] of Occupational Health - Occupational Stress Questionnaire    Feeling of Stress : Only a little  Social Connections: Moderately Isolated  (09/22/2023)   Social Connection and Isolation Panel [NHANES]    Frequency of Communication with Friends and Family: More than three times a week    Frequency of Social Gatherings with Friends and Family: More than three times a week    Attends Religious Services: Never    Database administrator or Organizations: No    Attends Banker Meetings: Never    Marital Status: Married    Tobacco Counseling Ready to quit: Not Answered Counseling given: Not Answered  Clinical Intake:  Pre-visit preparation completed: Yes  Pain : No/denies pain     Nutritional Status: BMI > 30  Obese Nutritional Risks: None Diabetes: Yes CBG done?: No Did pt. bring in CBG monitor from home?: No  How often do you need to have someone help you when you read instructions, pamphlets, or other written materials from your doctor or pharmacy?: 1 - Never  Interpreter Needed?: No  Information entered by :: Kennedy Bucker, LPN   Activities of Daily Living    09/22/2023    2:36 PM  In your present state of health, do you have any difficulty performing the following activities:  Hearing? 0  Vision? 0  Difficulty concentrating or making decisions? 0  Walking or climbing stairs? 1  Comment back pain, dropfoot  Dressing or bathing? 0  Doing errands, shopping? 0  Preparing Food and eating ? N  Using the Toilet? N  In the past six months, have you accidently leaked urine? N  Do you have problems with loss of bowel control? N  Managing your Medications? N  Managing your Finances? N  Housekeeping or managing your Housekeeping? N    Patient Care Team: Duanne Limerick, MD as PCP - General (Family Medicine) Otelia Sergeant, NP (Inactive) as Nurse Practitioner (Surgery)  Indicate any recent Medical Services you may have received from other than [facility] providers in the past year (date may be approximate).     Assessment:   This is a routine wellness examination for [place].  Hearing/Vision  screen Hearing Screening - Comments:: No aids Vision Screening - Comments:: Wears glasses- Dr.Woodard   Goals Addressed             This Visit's Progress    DIET - EAT MORE FRUITS AND VEGETABLES         Depression Screen    09/22/2023    2:34 PM 04/13/2023    2:01 PM 10/10/2022    2:07 PM 08/15/2022    3:18 PM 04/10/2022    3:08 PM 10/10/2021    9:21 AM 08/07/2021    3:25 PM  PHQ 2/9 Scores  PHQ - 2 Score 0 0 0 0 0 0 0  PHQ- 9 Score 0 0 0 2 3 0     Fall Risk    09/22/2023    2:36 PM 04/13/2023    2:01 PM 10/10/2022    2:07 PM 08/15/2022    2:55 PM 08/07/2021    3:27 PM  Fall Risk   Falls in the past year? 1 0 0 1 1  Number falls in past yr: 0 0 0 0 1  Injury with Fall? 0 0 0 0 0  Risk for fall due to : History of fall(s) No Fall Risks No Fall Risks Impaired balance/gait History of fall(s);Impaired balance/gait  Follow up Falls prevention discussed;Falls evaluation completed Falls evaluation completed Falls evaluation completed Falls evaluation completed Falls prevention discussed    MEDICARE RISK AT HOME: Medicare Risk at Home Any stairs in or around the home?: No If so, are there any without handrails?: No Home free of loose throw rugs in walkways, pet beds, electrical cords, etc?: Yes Adequate lighting in your home to reduce risk of falls?: Yes Life alert?: No Use of a cane, walker or w/c?: Yes (cane occasionally) Grab bars in the bathroom?: Yes Shower chair or bench in shower?: Yes Elevated toilet seat or a handicapped toilet?: Yes  TIMED UP AND GO:  Was the test performed?  No  Cognitive Function:        09/22/2023    2:38 PM 08/15/2022    2:55 PM 08/06/2020    3:42 PM 08/03/2019    3:41 PM 07/28/2018    2:00 PM  6CIT Screen  What Year? 0 points 0 points 0 points 0 points 0 points  What month? 0 points 0 points 0 points 0 points 0 points  What time? 3 points 0 points 0 points 0 points 0 points  Count back from 20 0 points 0 points 0 points 0 points 0  points  Months in reverse 0 points 0 points 0 points 0 points 4 points  Repeat phrase 0 points 0 points 0 points 0 points 6 points  Total Score 3 points 0 points 0 points 0 points 10 points    Immunizations Immunization History  Administered Date(s) Administered   Influenza Inj Mdck Quad With Preservative 12/17/2017   Influenza,inj,Quad PF,6+ Mos 10/14/2018, 10/10/2021   Influenza-Unspecified 08/29/2020   PNEUMOCOCCAL CONJUGATE-20 08/07/2021   Tdap 08/04/2018    TDAP status: Up to date  Flu Vaccine status: Due, Education has been provided regarding the importance of this vaccine. Advised may receive this vaccine at local pharmacy or Health Dept. Aware to provide a copy of the vaccination record if obtained from local pharmacy or Health Dept. Verbalized acceptance and understanding.  Pneumococcal vaccine status: Up to date  Covid-19 vaccine status: Declined, Education has been provided regarding the importance of this vaccine but patient still declined. Advised may receive this vaccine at local pharmacy or Health Dept.or vaccine clinic. Aware to provide a copy of the vaccination record if obtained from local pharmacy or Health Dept. Verbalized acceptance and understanding.  Qualifies for Shingles Vaccine? Yes   Zostavax completed No   Shingrix Completed?: No.    Education has been provided regarding the importance of this vaccine. Patient has been advised to call insurance company to determine out of pocket expense if they have not yet received this vaccine. Advised may also receive vaccine at local pharmacy or Health Dept. Verbalized acceptance and understanding.  Screening Tests Health Maintenance  Topic Date Due   Zoster Vaccines- Shingrix (1 of 2) Never done   Cervical Cancer Screening (HPV/Pap Cotest)  08/05/2021   MAMMOGRAM  05/01/2022   OPHTHALMOLOGY EXAM  08/30/2022   FOOT EXAM  04/10/2023   HEMOGLOBIN A1C  04/11/2023   INFLUENZA VACCINE  07/16/2023   Colonoscopy   08/24/2023   Diabetic kidney evaluation - Urine ACR  10/11/2023   Diabetic kidney evaluation - eGFR measurement  04/12/2024   Medicare Annual Wellness (AWV)  09/21/2024   DTaP/Tdap/Td (2 - Td or Tdap) 08/04/2028   Hepatitis C Screening  Completed   HPV VACCINES  Aged Out   COVID-19 Vaccine  Discontinued   HIV Screening  Discontinued    Health Maintenance  Health Maintenance Due  Topic Date Due   Zoster Vaccines- Shingrix (1 of 2) Never done   Cervical Cancer Screening (HPV/Pap Cotest)  08/05/2021   MAMMOGRAM  05/01/2022   OPHTHALMOLOGY EXAM  08/30/2022   FOOT EXAM  04/10/2023   HEMOGLOBIN A1C  04/11/2023   INFLUENZA VACCINE  07/16/2023   Colonoscopy  08/24/2023   Diabetic kidney evaluation - Urine ACR  10/11/2023    Colorectal cancer screening: Referral to GI placed 09/22/23. Pt aware the office will call re: appt.  Mammogram status: Ordered 09/22/23. Pt provided with contact info and advised to call to schedule appt.    Lung Cancer  Screening: (Low Dose CT Chest recommended if Age 8-80 years, 20 pack-year currently smoking OR have quit w/in 15years.) does qualify.   Lung Cancer Screening Referral: had CT scan   Additional Screening:  Hepatitis C Screening: does qualify; Completed 08/04/18  Vision Screening: Recommended annual ophthalmology exams for early detection of glaucoma and other disorders of the eye. Is the patient up to date with their annual eye exam?  Yes  Who is the provider or what is the name of the office in which the patient attends annual eye exams? Dr.Woodard If pt is not established with a provider, would they like to be referred to a provider to establish care? No .   Dental Screening: Recommended annual dental exams for proper oral hygiene  Diabetic Foot Exam: Diabetic Foot Exam: Overdue, Pt has been advised about the importance in completing this exam. Pt is scheduled for diabetic foot exam on next physical in November w/ endocrinologist.  Community  Resource Referral / Chronic Care Management: CRR required this visit?  No   CCM required this visit?  No     Plan:     I have personally reviewed and noted the following in the patient's chart:   Medical and social history Use of alcohol, tobacco or illicit drugs  Current medications and supplements including opioid prescriptions. Patient is currently taking opioid prescriptions. Information provided to patient regarding non-opioid alternatives. Patient advised to discuss non-opioid treatment plan with their provider. Functional ability and status Nutritional status Physical activity Advanced directives List of other physicians Hospitalizations, surgeries, and ER visits in previous 12 months Vitals Screenings to include cognitive, depression, and falls Referrals and appointments  In addition, I have reviewed and discussed with patient certain preventive protocols, quality metrics, and best practice recommendations. A written personalized care plan for preventive services as well as general preventive health recommendations were provided to patient.     Hal Hope, LPN   05/18/159   After Visit Summary: (MyChart) Due to this being a telephonic visit, the after visit summary with patients personalized plan was offered to patient via MyChart   Nurse Notes: none

## 2023-09-22 NOTE — Patient Instructions (Addendum)
Ms. Laconte , Thank you for taking time to come for your Medicare Wellness Visit. I appreciate your ongoing commitment to your health goals. Please review the following plan we discussed and let me know if I can assist you in the future.   Referrals/Orders/Follow-Ups/Clinician Recommendations: colonoscopy and mammogram referrals made  This is a list of the screening recommended for you and due dates:  Health Maintenance  Topic Date Due   Zoster (Shingles) Vaccine (1 of 2) Never done   Pap with HPV screening  08/05/2021   Mammogram  05/01/2022   Eye exam for diabetics  08/30/2022   Complete foot exam   04/10/2023   Hemoglobin A1C  04/11/2023   Flu Shot  07/16/2023   Colon Cancer Screening  08/24/2023   Yearly kidney health urinalysis for diabetes  10/11/2023   Yearly kidney function blood test for diabetes  04/12/2024   Medicare Annual Wellness Visit  09/21/2024   DTaP/Tdap/Td vaccine (2 - Td or Tdap) 08/04/2028   Hepatitis C Screening  Completed   HPV Vaccine  Aged Out   COVID-19 Vaccine  Discontinued   HIV Screening  Discontinued    Advanced directives: (ACP Link)Information on Advanced Care Planning can be found at [facility] Advance Health Care Directives Advance Health Care Directives ([email or web address]   Next Medicare Annual Wellness Visit scheduled for next year: Yes   09/27/24 @ 1:40 pm by video

## 2023-09-24 ENCOUNTER — Other Ambulatory Visit: Payer: Self-pay

## 2023-09-24 ENCOUNTER — Telehealth: Payer: Self-pay

## 2023-09-24 DIAGNOSIS — Z860100 Personal history of colon polyps, unspecified: Secondary | ICD-10-CM

## 2023-09-24 MED ORDER — NA SULFATE-K SULFATE-MG SULF 17.5-3.13-1.6 GM/177ML PO SOLN: 1.0000 | Freq: Once | ORAL | 0 refills | Status: AC

## 2023-09-24 NOTE — Telephone Encounter (Signed)
Gastroenterology Pre-Procedure Review  Request Date: 10/13/23 Requesting Physician: Dr. Servando Snare  PATIENT REVIEW QUESTIONS: The patient responded to the following health history questions as indicated:    1. Are you having any GI issues? no 2. Do you have a personal history of Polyps? yes (last colonoscopy performed by Dr.Wohl 08/23/18 recommended repeat in 5 years) 3. Do you have a family history of Colon Cancer or Polyps? no 4. Diabetes Mellitus? Yes takes Synjardy. Stop  5. Joint replacements in the past 12 months?no 6. Major health problems in the past 3 months?no 7. Any artificial heart valves, MVP, or defibrillator?no    MEDICATIONS & ALLERGIES:    Patient reports the following regarding taking any anticoagulation/antiplatelet therapy:   Plavix, Coumadin, Eliquis, Xarelto, Lovenox, Pradaxa, Brilinta, or Effient? no Aspirin? no  Patient confirms/reports the following medications:  Current Outpatient Medications  Medication Sig Dispense Refill   Na Sulfate-K Sulfate-Mg Sulf 17.5-3.13-1.6 GM/177ML SOLN Take 1 kit by mouth once for 1 dose. 354 mL 0   atorvastatin (LIPITOR) 10 MG tablet TAKE 1 TABLET BY MOUTH ONCE EVERY EVENING 90 tablet 1   DULoxetine (CYMBALTA) 30 MG capsule TK ONE C PO ONCE A DAY FOR 90 DAYS- pain management  3   fluticasone (FLONASE) 50 MCG/ACT nasal spray Place 2 sprays into both nostrils daily. 16 g 6   gabapentin (NEURONTIN) 600 MG tablet TK 1 T PO BID- pain management  3   glucose blood test strip 1 each by Other route 2 (two) times daily. 100 each 0   hydrochlorothiazide (HYDRODIURIL) 25 MG tablet Take 1 tablet (25 mg total) by mouth daily. 90 tablet 1   Lancets (ONETOUCH ULTRASOFT) lancets 1 each by Other route 2 (two) times daily. 100 each 0   lisinopril (ZESTRIL) 10 MG tablet Take 1 tablet (10 mg total) by mouth daily. 90 tablet 1   morphine (MS CONTIN) 15 MG 12 hr tablet TK 1 T PO  Q 8 H FOR 30 DAYS- pain management  0   naproxen sodium (ALEVE) 220 MG  tablet Take 220 mg by mouth every 12 (twelve) hours as needed. (Patient not taking: Reported on 09/22/2023)     NARCAN 4 MG/0.1ML LIQD nasal spray kit NAR REP ALN     Oxycodone HCl 10 MG TABS Take 10 mg by mouth 4 (four) times daily as needed. Pain clinic     SYNJARDY XR 12.04-999 MG TB24 Take 2 tablets by mouth daily. O'Connell     tiZANidine (ZANAFLEX) 4 MG tablet TK 1 T PO Q 8 H PRF SPASMS- pain management  3   No current facility-administered medications for this visit.    Patient confirms/reports the following allergies:  No Known Allergies  No orders of the defined types were placed in this encounter.   AUTHORIZATION INFORMATION Primary Insurance: 1D#: Group #:  Secondary Insurance: 1D#: Group #:  SCHEDULE INFORMATION: Date: 10/13/23 Time: Location: [facility]

## 2023-10-05 ENCOUNTER — Encounter: Payer: Self-pay | Admitting: Gastroenterology

## 2023-10-08 ENCOUNTER — Encounter: Payer: Self-pay | Admitting: Family Medicine

## 2023-10-08 ENCOUNTER — Ambulatory Visit: Payer: PPO | Admitting: Family Medicine

## 2023-10-08 ENCOUNTER — Encounter: Payer: Self-pay | Admitting: Gastroenterology

## 2023-10-08 VITALS — BP 128/82 | HR 108 | Ht 61.0 in | Wt 176.0 lb

## 2023-10-08 DIAGNOSIS — Z23 Encounter for immunization: Secondary | ICD-10-CM | POA: Diagnosis not present

## 2023-10-08 DIAGNOSIS — I10 Essential (primary) hypertension: Secondary | ICD-10-CM

## 2023-10-08 DIAGNOSIS — E7801 Familial hypercholesterolemia: Secondary | ICD-10-CM

## 2023-10-08 MED ORDER — LISINOPRIL 10 MG PO TABS: 10.0000 mg | ORAL_TABLET | Freq: Every day | ORAL | 1 refills | Status: DC

## 2023-10-08 MED ORDER — ATORVASTATIN CALCIUM 10 MG PO TABS: ORAL_TABLET | ORAL | 1 refills | Status: DC

## 2023-10-08 MED ORDER — HYDROCHLOROTHIAZIDE 25 MG PO TABS: 25.0000 mg | ORAL_TABLET | Freq: Every day | ORAL | 1 refills | Status: DC

## 2023-10-08 NOTE — Progress Notes (Signed)
Date:  10/08/2023   Name:  Erica Ruiz   DOB:  August 26, 1961   MRN:  [id number]   Chief Complaint: Hypertension and Hyperlipidemia  Hypertension This is a chronic problem. The current episode started more than 1 year ago. The problem has been gradually improving since onset. The problem is controlled. Pertinent negatives include no anxiety, blurred vision, chest pain, headaches, neck pain, orthopnea, palpitations, PND, shortness of breath or sweats. There are no associated agents to hypertension. There are no known risk factors for coronary artery disease. Past treatments include ACE inhibitors and diuretics. The current treatment provides moderate improvement. There are no compliance problems.  There is no history of CAD/MI or CVA. There is no history of chronic renal disease, a hypertension causing med or renovascular disease.  Hyperlipidemia This is a chronic problem. The current episode started more than 1 year ago. The problem is controlled. Recent lipid tests were reviewed and are normal. She has no history of chronic renal disease, diabetes or nephrotic syndrome. Factors aggravating her hyperlipidemia include thiazides. Pertinent negatives include no chest pain, focal sensory loss, focal weakness, leg pain, myalgias or shortness of breath. Current antihyperlipidemic treatment includes statins. The current treatment provides moderate improvement of lipids. There are no compliance problems.  Risk factors for coronary artery disease include dyslipidemia.    Lab Results  Component Value Date   NA 139 04/13/2023   K 4.5 04/13/2023   CO2 25 04/13/2023   GLUCOSE 96 04/13/2023   BUN 11 04/13/2023   CREATININE 0.88 04/13/2023   CALCIUM 9.4 04/13/2023   EGFR 75 04/13/2023   GFRNONAA 92 10/14/2018   Lab Results  Component Value Date   CHOL 165 04/13/2023   HDL 54 04/13/2023   LDLCALC 92 04/13/2023   LDLDIRECT 97 10/10/2022   TRIG 107 04/13/2023   CHOLHDL 3.3 07/15/2021   Lab  Results  Component Value Date   TSH 1.070 10/10/2022   Lab Results  Component Value Date   HGBA1C 5.6 10/10/2022   Lab Results  Component Value Date   WBC 7.3 10/10/2022   HGB 14.0 10/10/2022   HCT 41.3 10/10/2022   MCV 89 10/10/2022   PLT 227 10/10/2022   Lab Results  Component Value Date   ALT 11 04/13/2023   AST 14 04/13/2023   ALKPHOS 58 04/13/2023   BILITOT <0.2 04/13/2023   No results found for: "25OHVITD2", "25OHVITD3", "VD25OH"   Review of Systems  Constitutional:  Negative for fatigue and unexpected weight change.  HENT:  Negative for trouble swallowing.   Eyes:  Negative for blurred vision, redness and visual disturbance.  Respiratory:  Negative for choking, chest tightness, shortness of breath and stridor.   Cardiovascular:  Negative for chest pain, palpitations, orthopnea and PND.  Gastrointestinal:  Negative for abdominal distention.  Endocrine: Negative for polydipsia and polyuria.  Musculoskeletal:  Negative for myalgias and neck pain.  Neurological:  Negative for focal weakness and headaches.    Patient Active Problem List   Diagnosis Date Noted   Type 2 diabetes mellitus with hyperglycemia, without long-term current use of insulin (HCC) 09/17/2018   Tobacco dependence 09/17/2018   Special screening for malignant neoplasms, colon    Benign neoplasm of transverse colon    Polyp of sigmoid colon    Rectal polyp    Hyperglycemia 04/16/2018   Essential hypertension 04/16/2018   Venous insufficiency 04/16/2018   Abnormal weight gain 04/16/2018    No Known Allergies  Past Surgical History:  Procedure Laterality Date   BREAST BIOPSY Right    neg bx/clip   CHOLECYSTECTOMY     COLON SURGERY     COLONOSCOPY WITH PROPOFOL N/A 08/23/2018   Procedure: COLONOSCOPY WITH PROPOFOL with biopsies;  Surgeon: Midge Minium, MD;  Location: [facility];  Service: Endoscopy;  Laterality: N/A;  DIABETIC   COLOSTOMY REVERSAL     KNEE SURGERY     POLYPECTOMY  N/A 08/23/2018   Procedure: POLYPECTOMY;  Surgeon: Midge Minium, MD;  Location: [facility];  Service: Endoscopy;  Laterality: N/A;   TONSILLECTOMY     TUBAL LIGATION      Social History   Tobacco Use   Smoking status: Every Day    Current packs/day: 0.50    Average packs/day: 0.5 packs/day for 29.8 years (14.9 ttl pk-yrs)    Types: Cigarettes    Start date: 1995   Smokeless tobacco: Never  Vaping Use   Vaping status: Never Used  Substance Use Topics   Alcohol use: Never   Drug use: Never     Medication list has been reviewed and updated.  Current Meds  Medication Sig   atorvastatin (LIPITOR) 10 MG tablet TAKE 1 TABLET BY MOUTH ONCE EVERY EVENING   DULoxetine (CYMBALTA) 30 MG capsule TK ONE C PO ONCE A DAY FOR 90 DAYS- pain management   fluticasone (FLONASE) 50 MCG/ACT nasal spray Place 2 sprays into both nostrils daily.   gabapentin (NEURONTIN) 600 MG tablet TK 1 T PO BID- pain management   glucose blood test strip 1 each by Other route 2 (two) times daily.   hydrochlorothiazide (HYDRODIURIL) 25 MG tablet Take 1 tablet (25 mg total) by mouth daily.   Lancets (ONETOUCH ULTRASOFT) lancets 1 each by Other route 2 (two) times daily.   lisinopril (ZESTRIL) 10 MG tablet Take 1 tablet (10 mg total) by mouth daily.   morphine (MS CONTIN) 15 MG 12 hr tablet TK 1 T PO  Q 8 H FOR 30 DAYS- pain management   NARCAN 4 MG/0.1ML LIQD nasal spray kit NAR REP ALN   Oxycodone HCl 10 MG TABS Take 10 mg by mouth 4 (four) times daily as needed. Pain clinic   Probiotic Product (PROBIOTIC DAILY PO) Take by mouth daily.   SYNJARDY XR 12.04-999 MG TB24 Take 2 tablets by mouth daily. [place]   tiZANidine (ZANAFLEX) 4 MG tablet TK 1 T PO Q 8 H PRF SPASMS- pain management       10/08/2023    3:41 PM 04/13/2023    2:02 PM 10/10/2022    2:07 PM 08/15/2022    3:18 PM  GAD 7 : Generalized Anxiety Score  Nervous, Anxious, on Edge 0  0 0  Control/stop worrying 0 0 0 0  Worry too much -  different things 0 0 0 0  Trouble relaxing 0 0 0 0  Restless 0 0 0 0  Easily annoyed or irritable 0 0 0 0  Afraid - awful might happen 0 0 0 0  Total GAD 7 Score 0  0 0  Anxiety Difficulty Not difficult at all Not difficult at all Not difficult at all Not difficult at all       10/08/2023    3:41 PM 09/22/2023    2:34 PM 04/13/2023    2:01 PM  Depression screen PHQ 2/9  Decreased Interest 0 0 0  Down, Depressed, Hopeless 0 0 0  PHQ - 2 Score 0 0 0  Altered sleeping 2 0 0  Tired, decreased energy 1 0 0  Change in appetite 0 0 0  Feeling bad or failure about yourself  0 0 0  Trouble concentrating 0 0 0  Moving slowly or fidgety/restless 0 0 0  Suicidal thoughts 0 0 0  PHQ-9 Score 3 0 0  Difficult doing work/chores Not difficult at all Not difficult at all Not difficult at all    BP Readings from Last 3 Encounters:  10/08/23 128/82  04/13/23 120/76  10/10/22 122/78    Physical Exam Vitals and nursing note reviewed.  Constitutional:      Appearance: She is well-developed.  HENT:     Head: Normocephalic.     Right Ear: Tympanic membrane, ear canal and external ear normal.     Left Ear: Tympanic membrane, ear canal and external ear normal.     Mouth/Throat:     Mouth: Mucous membranes are moist.  Eyes:     General: Lids are everted, no foreign bodies appreciated. No scleral icterus.       Left eye: No foreign body or hordeolum.     Conjunctiva/sclera: Conjunctivae normal.     Right eye: Right conjunctiva is not injected.     Left eye: Left conjunctiva is not injected.     Pupils: Pupils are equal, round, and reactive to light.  Neck:     Thyroid: No thyromegaly.     Vascular: No JVD.     Trachea: No tracheal deviation.  Cardiovascular:     Rate and Rhythm: Normal rate and regular rhythm.     Heart sounds: Normal heart sounds. No murmur heard.    No friction rub. No gallop.  Pulmonary:     Effort: Pulmonary effort is normal. No respiratory distress.     Breath  sounds: Normal breath sounds. No stridor. No wheezing, rhonchi or rales.  Chest:     Chest wall: No tenderness.  Abdominal:     General: Bowel sounds are normal.     Palpations: Abdomen is soft. There is no mass.     Tenderness: There is no abdominal tenderness. There is no guarding or rebound.  Musculoskeletal:        General: No tenderness. Normal range of motion.     Cervical back: Normal range of motion and neck supple.  Lymphadenopathy:     Cervical: No cervical adenopathy.  Skin:    General: Skin is warm.     Findings: No rash.  Neurological:     Mental Status: She is alert and oriented to person, place, and time.     Cranial Nerves: No cranial nerve deficit.     Deep Tendon Reflexes: Reflexes normal.  Psychiatric:        Mood and Affect: Mood is not anxious or depressed.     Wt Readings from Last 3 Encounters:  10/08/23 176 lb (79.8 kg)  04/13/23 180 lb (81.6 kg)  10/10/22 185 lb (83.9 kg)    BP 128/82   Pulse (!) 108   Ht 5\' 1"  (1.549 m)   Wt 176 lb (79.8 kg)   SpO2 94%   BMI 33.25 kg/m   Assessment and Plan: 1. Essential hypertension Chronic.  Controlled.  Stable.  Blood pressure today is 128/82.  Asymptomatic.  Tolerating medication well.  Continue hydrochlorothiazide 25 mg once a day and lisinopril 10 mg once a day.  Review of previous renal function panel is acceptable and will likely be repeated by endocrinology.  Patient to return in 6 months for  hypertension recheck. - hydrochlorothiazide (HYDRODIURIL) 25 MG tablet; Take 1 tablet (25 mg total) by mouth daily.  Dispense: 90 tablet; Refill: 1 - lisinopril (ZESTRIL) 10 MG tablet; Take 1 tablet (10 mg total) by mouth daily.  Dispense: 90 tablet; Refill: 1  2. Familial hypercholesterolemia Chronic.  Controlled.  Stable.  Continue atorvastatin 10 mg once a day.  Review of previous lipid panel absolutely in normal range on current dosing and patient has lost weight and is doing well with her dietary approach.  We  will recheck in 6 months. - atorvastatin (LIPITOR) 10 MG tablet; TAKE 1 TABLET BY MOUTH ONCE EVERY EVENING  Dispense: 90 tablet; Refill: 1  3. Need for influenza vaccination Discussed and administered - Flu vaccine trivalent PF, 6mos and older(Flulaval,Afluria,Fluarix,Fluzone)     Elizabeth Sauer, MD

## 2023-10-13 ENCOUNTER — Ambulatory Visit: Payer: PPO | Admitting: Anesthesiology

## 2023-10-13 ENCOUNTER — Ambulatory Visit: Payer: PPO | Admitting: Family Medicine

## 2023-10-13 ENCOUNTER — Encounter: Payer: Self-pay | Admitting: Gastroenterology

## 2023-10-13 ENCOUNTER — Ambulatory Visit
Admission: RE | Admit: 2023-10-13 | Discharge: 2023-10-13 | Disposition: A | Discharge status: Home / Self Care | Payer: PPO | Attending: Gastroenterology | Admitting: Gastroenterology

## 2023-10-13 ENCOUNTER — Encounter
Admission: RE | Disposition: A | Discharge status: Home / Self Care | Payer: Self-pay | Source: Home / Self Care | Attending: Gastroenterology

## 2023-10-13 ENCOUNTER — Other Ambulatory Visit: Payer: Self-pay

## 2023-10-13 DIAGNOSIS — Z980 Intestinal bypass and anastomosis status: Secondary | ICD-10-CM | POA: Diagnosis not present

## 2023-10-13 DIAGNOSIS — F17210 Nicotine dependence, cigarettes, uncomplicated: Secondary | ICD-10-CM | POA: Insufficient documentation

## 2023-10-13 DIAGNOSIS — Z1211 Encounter for screening for malignant neoplasm of colon: Secondary | ICD-10-CM | POA: Insufficient documentation

## 2023-10-13 DIAGNOSIS — E119 Type 2 diabetes mellitus without complications: Secondary | ICD-10-CM | POA: Insufficient documentation

## 2023-10-13 DIAGNOSIS — I10 Essential (primary) hypertension: Secondary | ICD-10-CM | POA: Diagnosis not present

## 2023-10-13 DIAGNOSIS — D123 Benign neoplasm of transverse colon: Secondary | ICD-10-CM | POA: Insufficient documentation

## 2023-10-13 DIAGNOSIS — Z860100 Personal history of colon polyps, unspecified: Secondary | ICD-10-CM | POA: Diagnosis not present

## 2023-10-13 DIAGNOSIS — K640 First degree hemorrhoids: Secondary | ICD-10-CM | POA: Insufficient documentation

## 2023-10-13 DIAGNOSIS — K5730 Diverticulosis of large intestine without perforation or abscess without bleeding: Secondary | ICD-10-CM | POA: Diagnosis not present

## 2023-10-13 DIAGNOSIS — K635 Polyp of colon: Secondary | ICD-10-CM

## 2023-10-13 DIAGNOSIS — Z860101 Personal history of adenomatous and serrated colon polyps: Secondary | ICD-10-CM | POA: Diagnosis not present

## 2023-10-13 HISTORY — PX: POLYPECTOMY: SHX5525

## 2023-10-13 HISTORY — PX: COLONOSCOPY WITH PROPOFOL: SHX5780

## 2023-10-13 SURGERY — COLONOSCOPY WITH PROPOFOL
Anesthesia: General | Site: Rectum

## 2023-10-13 MED ORDER — STERILE WATER FOR IRRIGATION IR SOLN
Status: DC | PRN
  Administered 2023-10-13: 1

## 2023-10-13 MED ORDER — LIDOCAINE 2% (20 MG/ML) 5 ML SYRINGE
INTRAMUSCULAR | Status: DC | PRN
  Administered 2023-10-13: 50 mg via INTRAVENOUS

## 2023-10-13 MED ORDER — PROPOFOL 10 MG/ML IV BOLUS
INTRAVENOUS | Status: DC | PRN
  Administered 2023-10-13: 80 mg via INTRAVENOUS
  Administered 2023-10-13: 20 mg via INTRAVENOUS
  Administered 2023-10-13 (×2): 40 mg via INTRAVENOUS

## 2023-10-13 MED ORDER — LACTATED RINGERS IV SOLN: INTRAVENOUS | Status: DC

## 2023-10-13 MED ORDER — SODIUM CHLORIDE 0.9 % IV SOLN: INTRAVENOUS | Status: DC

## 2023-10-13 MED ORDER — SODIUM CHLORIDE 0.9% FLUSH
10.0000 mL | INTRAVENOUS | Status: DC | PRN
  Administered 2023-10-13: 3 mL via INTRAVENOUS
  Administered 2023-10-13 (×2): 5 mL via INTRAVENOUS

## 2023-10-13 SURGICAL SUPPLY — 8 items
GOWN CVR UNV OPN BCK APRN NK (MISCELLANEOUS) ×4 IMPLANT
GOWN ISOL THUMB LOOP REG UNIV (MISCELLANEOUS) ×4
KIT PRC NS LF DISP ENDO (KITS) ×2 IMPLANT
KIT PROCEDURE OLYMPUS (KITS) ×2
MANIFOLD NEPTUNE II (INSTRUMENTS) ×2 IMPLANT
SNARE COLD EXACTO (MISCELLANEOUS) IMPLANT
TRAP ETRAP POLY (MISCELLANEOUS) IMPLANT
WATER STERILE IRR 250ML POUR (IV SOLUTION) ×2 IMPLANT

## 2023-10-13 NOTE — Anesthesia Preprocedure Evaluation (Signed)
Anesthesia Evaluation  Patient identified by MRN, date of birth, ID band Patient awake    Reviewed: Allergy & Precautions, H&P , NPO status , Patient's Chart, lab work & pertinent test results  Airway Mallampati: III  TM Distance: >3 FB Neck ROM: Full    Dental  (+) Chipped   Pulmonary Current Smoker   Pulmonary exam normal breath sounds clear to auscultation       Cardiovascular hypertension, Normal cardiovascular exam Rhythm:Regular Rate:Normal     Neuro/Psych negative neurological ROS  negative psych ROS   GI/Hepatic negative GI ROS, Neg liver ROS,,,  Endo/Other  diabetes    Renal/GU negative Renal ROS  negative genitourinary   Musculoskeletal negative musculoskeletal ROS (+)    Abdominal   Peds negative pediatric ROS (+)  Hematology negative hematology ROS (+)   Anesthesia Other Findings Back pain 'diabetes hypertension  Reproductive/Obstetrics negative OB ROS                             Anesthesia Physical Anesthesia Plan  ASA: 2  Anesthesia Plan: General   Post-op Pain Management:    Induction: Intravenous  PONV Risk Score and Plan:   Airway Management Planned: Natural Airway and Nasal Cannula  Additional Equipment:   Intra-op Plan:   Post-operative Plan:   Informed Consent: I have reviewed the patients History and Physical, chart, labs and discussed the procedure including the risks, benefits and alternatives for the proposed anesthesia with the patient or authorized representative who has indicated his/her understanding and acceptance.     Dental Advisory Given  Plan Discussed with: Anesthesiologist, CRNA and Surgeon  Anesthesia Plan Comments: (Patient consented for risks of anesthesia including but not limited to:  - adverse reactions to medications - risk of airway placement if required - damage to eyes, teeth, lips or other oral mucosa - nerve damage  due to positioning  - sore throat or hoarseness - Damage to heart, brain, nerves, lungs, other parts of body or loss of life  Patient voiced understanding and assent.)       Anesthesia Quick Evaluation

## 2023-10-13 NOTE — Op Note (Signed)
[facility] Patient Name: Erica Ruiz Procedure Date: 10/13/2023 8:20 AM MRN: [id number] Account #: [id number] Date of Birth: [birth year] Admit Type: Outpatient Age: 62 Room: [facility] OR ROOM 01 Gender: Female Note Status: Finalized Instrument Name: [id number] Procedure:             Colonoscopy Indications:           High risk colon cancer surveillance: Personal history                         of colonic polyps Providers:             Midge Minium MD, MD Referring MD:          Duanne Limerick, MD (Referring MD) Medicines:             Propofol per Anesthesia Complications:         No immediate complications. Procedure:             Pre-Anesthesia Assessment:                        - Prior to the procedure, a History and Physical was                         performed, and patient medications and allergies were                         reviewed. The patient's tolerance of previous                         anesthesia was also reviewed. The risks and benefits                         of the procedure and the sedation options and risks                         were discussed with the patient. All questions were                         answered, and informed consent was obtained. Prior                         Anticoagulants: The patient has taken no anticoagulant                         or antiplatelet agents. ASA Grade Assessment: II - A                         patient with mild systemic disease. After reviewing                         the risks and benefits, the patient was deemed in                         satisfactory condition to undergo the procedure.                        After obtaining informed consent, the colonoscope was  passed under direct vision. Throughout the procedure,                         the patient's blood pressure, pulse, and oxygen                         saturations were monitored continuously. The                          Colonoscope was introduced through the anus and                         advanced to the the cecum, identified by appendiceal                         orifice and ileocecal valve. The colonoscopy was                         performed without difficulty. The patient tolerated                         the procedure well. The quality of the bowel                         preparation was excellent. Findings:      The perianal and digital rectal examinations were normal.      There was evidence of a prior end-to-side colo-colonic anastomosis in       the sigmoid colon. This was patent.      A 5 mm polyp was found in the transverse colon. The polyp was sessile.       The polyp was removed with a cold snare. Resection and retrieval were       complete.      Non-bleeding internal hemorrhoids were found during retroflexion. The       hemorrhoids were Grade I (internal hemorrhoids that do not prolapse).      Many small-mouthed diverticula were found in the entire colon. Impression:            - Patent end-to-side colo-colonic anastomosis.                        - One 5 mm polyp in the transverse colon, removed with                         a cold snare. Resected and retrieved.                        - Non-bleeding internal hemorrhoids.                        - Diverticulosis in the entire examined colon. Recommendation:        - Discharge patient to home.                        - Resume previous diet.                        - Continue present medications.                        -  Await pathology results.                        - Repeat colonoscopy in 7 years for surveillance. Procedure Code(s):     --- Professional ---                        [id number], Colonoscopy, flexible; with removal of                         tumor(s), polyp(s), or other lesion(s) by snare                         technique Diagnosis Code(s):     --- Professional ---                        Z86.010, Personal history of colonic  polyps                        D12.3, Benign neoplasm of transverse colon (hepatic                         flexure or splenic flexure) CPT copyright 2022 [employer]. All rights reserved. The codes documented in this report are preliminary and upon coder review may  be revised to meet current compliance requirements. Midge Minium MD, MD 10/13/2023 8:45:40 AM This report has been signed electronically. Number of Addenda: 0 Note Initiated On: 10/13/2023 8:20 AM Scope Withdrawal Time: 0 hours 7 minutes 15 seconds  Total Procedure Duration: 0 hours 12 minutes 58 seconds  Estimated Blood Loss:  Estimated blood loss: none.      [facility]

## 2023-10-13 NOTE — Transfer of Care (Signed)
Immediate Anesthesia Transfer of Care Note  Patient: Erica Ruiz [facility]  Procedure(s) Performed: COLONOSCOPY WITH BIOPSY (Rectum) POLYPECTOMY (Rectum)  Patient Location: PACU  Anesthesia Type:General  Level of Consciousness: awake, alert , and oriented  Airway & Oxygen Therapy: Patient Spontanous Breathing  Post-op Assessment: Report given to RN and Post -op Vital signs reviewed and stable  Post vital signs: Reviewed and stable  Last Vitals:  Vitals Value Taken Time  BP 104/70 10/13/23 0846  Temp    Pulse 93 10/13/23 0847  Resp 18 10/13/23 0847  SpO2 95 % 10/13/23 0847  Vitals shown include unfiled device data.  Last Pain:  Vitals:   10/13/23 0751  TempSrc: Temporal  PainSc: 0-No pain         Complications: No notable events documented.

## 2023-10-13 NOTE — H&P (Signed)
Midge Minium, MD [facility] [address] [place], [place] [address] Phone:[phone] Fax : [phone]  Primary Care Physician:  Duanne Limerick, MD Primary Gastroenterologist:  Dr. Servando Snare  Pre-Procedure History & Physical: HPI:  Erica Ruiz is a 62 y.o. female is here for an colonoscopy.   Past Medical History:  Diagnosis Date   Back pain    Diabetes mellitus without complication (HCC)    Hypertension     Past Surgical History:  Procedure Laterality Date   BREAST BIOPSY Right    neg bx/clip   CHOLECYSTECTOMY     COLON SURGERY     COLONOSCOPY WITH PROPOFOL N/A 08/23/2018   Procedure: COLONOSCOPY WITH PROPOFOL with biopsies;  Surgeon: Midge Minium, MD;  Location: [facility];  Service: Endoscopy;  Laterality: N/A;  DIABETIC   COLOSTOMY REVERSAL     KNEE SURGERY     POLYPECTOMY N/A 08/23/2018   Procedure: POLYPECTOMY;  Surgeon: Midge Minium, MD;  Location: [facility];  Service: Endoscopy;  Laterality: N/A;   TONSILLECTOMY     TUBAL LIGATION      Prior to Admission medications   Medication Sig Start Date End Date Taking? Authorizing Provider  atorvastatin (LIPITOR) 10 MG tablet TAKE 1 TABLET BY MOUTH ONCE EVERY EVENING 10/08/23  Yes Duanne Limerick, MD  DULoxetine (CYMBALTA) 30 MG capsule TK ONE C PO ONCE A DAY FOR 90 DAYS- pain management 03/18/18  Yes [provider]  fluticasone (FLONASE) 50 MCG/ACT nasal spray Place 2 sprays into both nostrils daily. 06/17/20  Yes Hawks, Christy A, FNP  gabapentin (NEURONTIN) 600 MG tablet TK 1 T PO BID- pain management 03/19/18  Yes [provider]  hydrochlorothiazide (HYDRODIURIL) 25 MG tablet Take 1 tablet (25 mg total) by mouth daily. 10/08/23  Yes Duanne Limerick, MD  lisinopril (ZESTRIL) 10 MG tablet Take 1 tablet (10 mg total) by mouth daily. 10/08/23  Yes Duanne Limerick, MD  morphine (MS CONTIN) 15 MG 12 hr tablet TK 1 T PO  Q 8 H FOR 30 DAYS- pain management 03/27/18  Yes [provider]  Oxycodone HCl 10 MG TABS Take 10 mg by mouth 4 (four) times daily as needed. Pain clinic 11/05/20  Yes [provider]  Probiotic Product (PROBIOTIC DAILY PO) Take by mouth daily.   Yes [provider]  SYNJARDY XR 12.04-999 MG TB24 Take 2 tablets by mouth daily. [place] 01/21/22  Yes [provider]  tiZANidine (ZANAFLEX) 4 MG tablet TK 1 T PO Q 8 H PRF SPASMS- pain management 03/18/18  Yes [provider]  glucose blood test strip 1 each by Other route 2 (two) times daily. 10/06/18   Duanne Limerick, MD  Lancets [facility] ULTRASOFT) lancets 1 each by Other route 2 (two) times daily. 10/06/18   Duanne Limerick, MD  NARCAN 4 MG/0.1ML LIQD nasal spray kit NAR REP ALN 07/11/19   [provider]    Allergies as of 09/24/2023   (No Known Allergies)    Family History  Problem Relation Age of Onset   Pulmonary embolism Mother    Cancer Father        prostate   Heart disease Father    Hypertension Father    Cancer Sister        non-hodgkin's lymphoma   Hepatitis C Sister    Cancer Brother        colon   Diabetes Brother    Heart disease Brother  Hypertension Brother    Diabetes Daughter    Diabetes Brother    Hypertension Sister    Cancer Brother        prostate   Breast cancer Paternal Aunt     Social History   Socioeconomic History   Marital status: Married    Spouse name: Bobbye Riggs   Number of children: 4   Years of education: Not on file   Highest education level: Some college, no degree  Occupational History   Occupation: Disabled  Tobacco Use   Smoking status: Every Day    Current packs/day: 0.50    Average packs/day: 0.5 packs/day for 29.8 years (14.9 ttl pk-yrs)    Types: Cigarettes    Start date: 1995   Smokeless tobacco: Never  Vaping Use   Vaping status: Never Used  Substance and Sexual Activity   Alcohol use: Never   Drug use: Never   Sexual activity: Not Currently    Birth  control/protection: Spermicide  Other Topics Concern   Not on file  Social History Narrative   ** Merged History Encounter **       Social Determinants of Health   Financial Resource Strain: Low Risk  (10/08/2023)   Overall Financial Resource Strain (CARDIA)    Difficulty of Paying Living Expenses: Not hard at all  Food Insecurity: No Food Insecurity (10/08/2023)   Hunger Vital Sign    Worried About Running Out of Food in the Last Year: Never true    Ran Out of Food in the Last Year: Never true  Transportation Needs: No Transportation Needs (10/08/2023)   PRAPARE - Administrator, Civil Service (Medical): No    Lack of Transportation (Non-Medical): No  Physical Activity: Inactive (10/08/2023)   Exercise Vital Sign    Days of Exercise per Week: 0 days    Minutes of Exercise per Session: 20 min  Stress: No Stress Concern Present (10/08/2023)   [employer] of Occupational Health - Occupational Stress Questionnaire    Feeling of Stress : Not at all  Social Connections: Unknown (10/08/2023)   Social Connection and Isolation Panel [NHANES]    Frequency of Communication with Friends and Family: More than three times a week    Frequency of Social Gatherings with Friends and Family: More than three times a week    Attends Religious Services: Patient declined    Database administrator or Organizations: No    Attends Banker Meetings: Never    Marital Status: Married  Recent Concern: Social Connections - Moderately Isolated (09/22/2023)   Social Connection and Isolation Panel [NHANES]    Frequency of Communication with Friends and Family: More than three times a week    Frequency of Social Gatherings with Friends and Family: More than three times a week    Attends Religious Services: Never    Database administrator or Organizations: No    Attends Banker Meetings: Never    Marital Status: Married  Catering manager Violence: Not At Risk  (09/22/2023)   Humiliation, Afraid, Rape, and Kick questionnaire    Fear of Current or Ex-Partner: No    Emotionally Abused: No    Physically Abused: No    Sexually Abused: No    Review of Systems: See HPI, otherwise negative ROS  Physical Exam: BP 123/84   Temp (!) 96.8 F (36 C) (Temporal)   Resp 15   Ht 5' 0.98" (1.549 m)   Wt 78 kg  SpO2 96%   BMI 32.52 kg/m  General:   Alert,  pleasant and cooperative in NAD Head:  Normocephalic and atraumatic. Neck:  Supple; no masses or thyromegaly. Lungs:  Clear throughout to auscultation.    Heart:  Regular rate and rhythm. Abdomen:  Soft, nontender and nondistended. Normal bowel sounds, without guarding, and without rebound.   Neurologic:  Alert and  oriented x4;  grossly normal neurologically.  Impression/Plan: Erica Ruiz is here for an colonoscopy to be performed for a history of adenomatous polyps on 2019    Risks, benefits, limitations, and alternatives regarding  colonoscopy have been reviewed with the patient.  Questions have been answered.  All parties agreeable.   Midge Minium, MD  10/13/2023, 8:19 AM

## 2023-10-13 NOTE — Anesthesia Postprocedure Evaluation (Signed)
Anesthesia Post Note  Patient: Erica Ruiz [facility]  Procedure(s) Performed: COLONOSCOPY WITH BIOPSY (Rectum) POLYPECTOMY (Rectum)  Patient location during evaluation: PACU Anesthesia Type: General Level of consciousness: awake and alert Pain management: pain level controlled Vital Signs Assessment: post-procedure vital signs reviewed and stable Respiratory status: spontaneous breathing, nonlabored ventilation, respiratory function stable and patient connected to nasal cannula oxygen Cardiovascular status: blood pressure returned to baseline and stable Postop Assessment: no apparent nausea or vomiting Anesthetic complications: no   No notable events documented.   Last Vitals:  Vitals:   10/13/23 0846 10/13/23 0853  BP: 104/70 123/84  Pulse: 92 94  Resp: 19 15  Temp: (!) 36.1 C (!) 36.1 C  SpO2: 96% 97%    Last Pain:  Vitals:   10/13/23 0853  TempSrc:   PainSc: 0-No pain                 Marisue Humble

## 2023-10-14 ENCOUNTER — Encounter: Payer: Self-pay | Admitting: Gastroenterology

## 2023-10-14 LAB — SURGICAL PATHOLOGY

## 2023-10-15 ENCOUNTER — Encounter: Payer: Self-pay | Admitting: Gastroenterology

## 2023-10-15 DIAGNOSIS — M25562 Pain in left knee: Secondary | ICD-10-CM | POA: Diagnosis not present

## 2023-10-15 DIAGNOSIS — G894 Chronic pain syndrome: Secondary | ICD-10-CM | POA: Diagnosis not present

## 2023-10-15 DIAGNOSIS — M4716 Other spondylosis with myelopathy, lumbar region: Secondary | ICD-10-CM | POA: Diagnosis not present

## 2023-10-15 DIAGNOSIS — M25561 Pain in right knee: Secondary | ICD-10-CM | POA: Diagnosis not present

## 2023-10-15 DIAGNOSIS — M5116 Intervertebral disc disorders with radiculopathy, lumbar region: Secondary | ICD-10-CM | POA: Diagnosis not present

## 2023-10-29 ENCOUNTER — Encounter: Payer: Self-pay | Admitting: Family Medicine

## 2023-10-30 DIAGNOSIS — R809 Proteinuria, unspecified: Secondary | ICD-10-CM | POA: Diagnosis not present

## 2023-10-30 DIAGNOSIS — E1129 Type 2 diabetes mellitus with other diabetic kidney complication: Secondary | ICD-10-CM | POA: Diagnosis not present

## 2023-12-02 ENCOUNTER — Encounter: Payer: Self-pay | Admitting: Family Medicine

## 2023-12-02 NOTE — Telephone Encounter (Signed)
 Care team updated and letter sent for eye exam notes.

## 2023-12-17 DIAGNOSIS — M25561 Pain in right knee: Secondary | ICD-10-CM | POA: Diagnosis not present

## 2023-12-17 DIAGNOSIS — Z79899 Other long term (current) drug therapy: Secondary | ICD-10-CM | POA: Diagnosis not present

## 2023-12-17 DIAGNOSIS — M5116 Intervertebral disc disorders with radiculopathy, lumbar region: Secondary | ICD-10-CM | POA: Diagnosis not present

## 2023-12-17 DIAGNOSIS — M25562 Pain in left knee: Secondary | ICD-10-CM | POA: Diagnosis not present

## 2023-12-17 DIAGNOSIS — M4716 Other spondylosis with myelopathy, lumbar region: Secondary | ICD-10-CM | POA: Diagnosis not present

## 2023-12-17 DIAGNOSIS — G894 Chronic pain syndrome: Secondary | ICD-10-CM | POA: Diagnosis not present

## 2024-01-04 ENCOUNTER — Encounter: Payer: Self-pay | Admitting: *Deleted

## 2024-01-04 ENCOUNTER — Ambulatory Visit
Admission: EM | Admit: 2024-01-04 | Discharge: 2024-01-04 | Disposition: A | Discharge status: Home / Self Care | Payer: PPO | Attending: Emergency Medicine | Admitting: Emergency Medicine

## 2024-01-04 DIAGNOSIS — K5732 Diverticulitis of large intestine without perforation or abscess without bleeding: Secondary | ICD-10-CM

## 2024-01-04 DIAGNOSIS — R109 Unspecified abdominal pain: Secondary | ICD-10-CM

## 2024-01-04 MED ORDER — ONDANSETRON 8 MG PO TBDP: 8.0000 mg | ORAL_TABLET | Freq: Three times a day (TID) | ORAL | 0 refills | Status: DC | PRN

## 2024-01-04 MED ORDER — AMOXICILLIN-POT CLAVULANATE 875-125 MG PO TABS: 1.0000 | ORAL_TABLET | Freq: Two times a day (BID) | ORAL | 0 refills | Status: AC

## 2024-01-04 NOTE — Discharge Instructions (Addendum)
Take the Augmentin 875 mg twice daily with food for 10 days for treatment of your diverticulitis.  Use over-the-counter Tylenol and/or ibuprofen according to the package instructions as needed for pain.  Use the Zofran every 8 hours as needed for nausea or vomiting.  Drink plenty of fluids to maintain hydration.  Follow a low residue diet until after you finish antibiotics until after your pain resolves.  Once your infection has resolved return to high-fiber diet to help achieve regular bowel movements and prevent diverticulitis flares from reoccurring.  If you develop any fever, sharp increase in abdominal pain, nausea and vomiting where you cannot keep medications or fluids down, or blood in your stools you need to go to the ER for evaluation.

## 2024-01-04 NOTE — ED Triage Notes (Signed)
Patient states lower abdominal pain for weeks, worse in the past few days, stabbing pain that sometimes radiates to chest.  No dysuria, endorses chills and vomited 2 days ago, no bloody stools.  Taking OTC Goody powder with some relief

## 2024-01-04 NOTE — ED Provider Notes (Addendum)
[facility]    CSN: [id number] Arrival date & time: 01/04/24  1646      History   Chief Complaint Chief Complaint  Patient presents with   Abdominal Pain    HPI Erica Ruiz is a 63 y.o. female.   HPI  63 year old female with past medical history significant for hypertension, diabetes, colon polyps, and venous insufficiency presents for evaluation of lower abdominal pain that has been going on since before Christmas.  She reports that the pain would happen approximately once a week and would resolve.  Over the last 2 days she has had an increase in the pain to where it is more constant.  She describes the pain as being across the middle of her abdomen.  There has been associated chills, nausea, and vomiting but no blood in her emesis or stools.  She states that the pain does increase after eating.  No alleviating factors.  She describes the pain as a stabbing and 10/10 at maximum and is currently a 4/10.  Patient has not talked to her gastroenterologist.  Past Medical History:  Diagnosis Date   Back pain    Diabetes mellitus without complication (HCC)    Hypertension     Patient Active Problem List   Diagnosis Date Noted   History of colonic polyps 10/13/2023   Polyp of transverse colon 10/13/2023   Type 2 diabetes mellitus with hyperglycemia, without long-term current use of insulin (HCC) 09/17/2018   Tobacco dependence 09/17/2018   Special screening for malignant neoplasms, colon    Benign neoplasm of transverse colon    Polyp of sigmoid colon    Rectal polyp    Hyperglycemia 04/16/2018   Essential hypertension 04/16/2018   Venous insufficiency 04/16/2018   Abnormal weight gain 04/16/2018    Past Surgical History:  Procedure Laterality Date   BREAST BIOPSY Right    neg bx/clip   CHOLECYSTECTOMY     COLON SURGERY     COLONOSCOPY WITH PROPOFOL N/A 08/23/2018   Procedure: COLONOSCOPY WITH PROPOFOL with biopsies;  Surgeon: Midge Minium, MD;   Location: [facility];  Service: Endoscopy;  Laterality: N/A;  DIABETIC   COLONOSCOPY WITH PROPOFOL N/A 10/13/2023   Procedure: COLONOSCOPY WITH BIOPSY;  Surgeon: Midge Minium, MD;  Location: [facility];  Service: Endoscopy;  Laterality: N/A;  Diabetic   COLOSTOMY REVERSAL     KNEE SURGERY     POLYPECTOMY N/A 08/23/2018   Procedure: POLYPECTOMY;  Surgeon: Midge Minium, MD;  Location: [facility];  Service: Endoscopy;  Laterality: N/A;   POLYPECTOMY  10/13/2023   Procedure: POLYPECTOMY;  Surgeon: Midge Minium, MD;  Location: [facility];  Service: Endoscopy;;   TONSILLECTOMY     TUBAL LIGATION      OB History   No obstetric history on file.      Home Medications    Prior to Admission medications   Medication Sig Start Date End Date Taking? Authorizing Provider  amoxicillin-clavulanate (AUGMENTIN) 875-125 MG tablet Take 1 tablet by mouth every 12 (twelve) hours for 10 days. 01/04/24 01/14/24 Yes Becky Augusta, NP  ondansetron (ZOFRAN-ODT) 8 MG disintegrating tablet Take 1 tablet (8 mg total) by mouth every 8 (eight) hours as needed for nausea or vomiting. 01/04/24  Yes Becky Augusta, NP  atorvastatin (LIPITOR) 10 MG tablet TAKE 1 TABLET BY MOUTH ONCE EVERY EVENING 10/08/23   Duanne Limerick, MD  DULoxetine (CYMBALTA) 30 MG capsule TK ONE C PO ONCE A DAY FOR 90 DAYS-  pain management 03/18/18   [provider]  fluticasone (FLONASE) 50 MCG/ACT nasal spray Place 2 sprays into both nostrils daily. 06/17/20   Junie Spencer, FNP  gabapentin (NEURONTIN) 600 MG tablet TK 1 T PO BID- pain management 03/19/18   [provider]  glucose blood test strip 1 each by Other route 2 (two) times daily. 10/06/18   Duanne Limerick, MD  hydrochlorothiazide (HYDRODIURIL) 25 MG tablet Take 1 tablet (25 mg total) by mouth daily. 10/08/23   Duanne Limerick, MD  Lancets [facility] ULTRASOFT) lancets 1 each by Other route 2 (two) times daily. 10/06/18   Duanne Limerick, MD   lisinopril (ZESTRIL) 10 MG tablet Take 1 tablet (10 mg total) by mouth daily. 10/08/23   Duanne Limerick, MD  morphine (MS CONTIN) 15 MG 12 hr tablet TK 1 T PO  Q 8 H FOR 30 DAYS- pain management 03/27/18   [provider]  NARCAN 4 MG/0.1ML LIQD nasal spray kit NAR REP ALN 07/11/19   [provider]  Oxycodone HCl 10 MG TABS Take 10 mg by mouth 4 (four) times daily as needed. Pain clinic 11/05/20   [provider]  Probiotic Product (PROBIOTIC DAILY PO) Take by mouth daily.    [provider]  SYNJARDY XR 12.04-999 MG TB24 Take 2 tablets by mouth daily. Gershon Crane 01/21/22   [provider]  tiZANidine (ZANAFLEX) 4 MG tablet TK 1 T PO Q 8 H PRF SPASMS- pain management 03/18/18   [provider]    Family History Family History  Problem Relation Age of Onset   Pulmonary embolism Mother    Cancer Father        prostate   Heart disease Father    Hypertension Father    Cancer Sister        non-hodgkin's lymphoma   Hepatitis C Sister    Cancer Brother        colon   Diabetes Brother    Heart disease Brother    Hypertension Brother    Diabetes Daughter    Diabetes Brother    Hypertension Sister    Cancer Brother        prostate   Breast cancer Paternal Aunt     Social History Social History   Tobacco Use   Smoking status: Every Day    Current packs/day: 0.50    Average packs/day: 0.5 packs/day for 30.1 years (15.0 ttl pk-yrs)    Types: Cigarettes    Start date: 1995   Smokeless tobacco: Never  Vaping Use   Vaping status: Never Used  Substance Use Topics   Alcohol use: Never   Drug use: Never     Allergies   Patient has no known allergies.   Review of Systems Review of Systems  Constitutional:  Positive for chills. Negative for fever.  Gastrointestinal:  Positive for abdominal pain, nausea and vomiting. Negative for blood in stool.     Physical Exam Triage Vital Signs ED Triage Vitals  Encounter Vitals Group      BP      Systolic BP Percentile      Diastolic BP Percentile      Pulse      Resp      Temp      Temp src      SpO2      Weight      Height      Head Circumference      Peak Flow  Pain Score      Pain Loc      Pain Education      Exclude from Growth Chart    No data found.  Updated Vital Signs BP 138/89 (BP Location: Left Arm)   Pulse (!) 103   Temp 98.1 F (36.7 C) (Oral)   Resp 20   Ht 5\' 1"  (1.549 m)   Wt 175 lb 12.8 oz (79.7 kg)   SpO2 99%   BMI 33.22 kg/m   Visual Acuity Right Eye Distance:   Left Eye Distance:   Bilateral Distance:    Right Eye Near:   Left Eye Near:    Bilateral Near:     Physical Exam Vitals and nursing note reviewed.  Constitutional:      Appearance: Normal appearance. She is not ill-appearing.  HENT:     Head: Normocephalic and atraumatic.  Cardiovascular:     Rate and Rhythm: Normal rate and regular rhythm.     Pulses: Normal pulses.     Heart sounds: Normal heart sounds. No murmur heard.    No friction rub. No gallop.  Pulmonary:     Effort: Pulmonary effort is normal.     Breath sounds: Normal breath sounds. No wheezing, rhonchi or rales.  Abdominal:     General: Abdomen is flat.     Palpations: Abdomen is soft.     Tenderness: There is abdominal tenderness. There is no guarding or rebound.  Skin:    General: Skin is warm and dry.     Capillary Refill: Capillary refill takes less than 2 seconds.     Findings: No rash.  Neurological:     General: No focal deficit present.     Mental Status: She is alert and oriented to person, place, and time.      UC Treatments / Results  Labs (all labs ordered are listed, but only abnormal results are displayed) Labs Reviewed - No data to display  EKG   Radiology No results found.  Procedures Procedures (including critical care time)  Medications Ordered in UC Medications - No data to display  Initial Impression / Assessment and Plan / UC Course  I have  reviewed the triage vital signs and the nursing notes.  Pertinent labs & imaging results that were available during my care of the patient were reviewed by me and considered in my medical decision making (see chart for details).   Patient is a pleasant, nontoxic-appearing 63 year old female presenting for evaluation of abdominal pain as outlined HPI above.  On exam patient's abdomen is soft with mild epigastric and left upper quadrant tenderness.  No guarding or rebound.  The remainder of her abdomen is benign.  10/13/2023 patient had a colonoscopy which resulted in the removal of a tubular adenoma from the transverse colon.  Many small bowel diverticula were found throughout the colon at that time.  Patient also had 1 grade 1 internal hemorrhoid but no bleeding.  The patient reports that she has been using Goody powders, which alleviates the pain.  She is concerned about possible diverticulitis flare.  Given her history of diffuse diverticula this is a distinct possibility.  We discussed abdominal imaging, which we are unable to perform here at the urgent care, along with managements of possible diverticulitis.  Through shared decision making it was decided to do a trial of Augmentin 875 mg twice daily with food for 10 days.  If she develops any increased abdominal pain, fevers, or develops blood in her  stools she is to go to the ER for evaluation.  I will also send over prescription for Zofran that she can use every 8 hours as needed for nausea or vomiting.   Final Clinical Impressions(s) / UC Diagnoses   Final diagnoses:  Abdominal pain, unspecified abdominal location  Diverticulitis of colon without hemorrhage     Discharge Instructions      Take the Augmentin 875 mg twice daily with food for 10 days for treatment of your diverticulitis.  Use over-the-counter Tylenol and/or ibuprofen according to the package instructions as needed for pain.  Use the Zofran every 8 hours as needed for nausea  or vomiting.  Drink plenty of fluids to maintain hydration.  Follow a low residue diet until after you finish antibiotics until after your pain resolves.  Once your infection has resolved return to high-fiber diet to help achieve regular bowel movements and prevent diverticulitis flares from reoccurring.  If you develop any fever, sharp increase in abdominal pain, nausea and vomiting where you cannot keep medications or fluids down, or blood in your stools you need to go to the ER for evaluation.     ED Prescriptions     Medication Sig Dispense Auth. Provider   amoxicillin-clavulanate (AUGMENTIN) 875-125 MG tablet Take 1 tablet by mouth every 12 (twelve) hours for 10 days. 20 tablet Becky Augusta, NP   ondansetron (ZOFRAN-ODT) 8 MG disintegrating tablet Take 1 tablet (8 mg total) by mouth every 8 (eight) hours as needed for nausea or vomiting. 20 tablet Becky Augusta, NP      PDMP not reviewed this encounter.   Becky Augusta, NP 01/04/24 1730    Becky Augusta, NP 01/04/24 [phone]

## 2024-01-06 ENCOUNTER — Ambulatory Visit
Admission: RE | Admit: 2024-01-06 | Discharge: 2024-01-06 | Disposition: A | Discharge status: Home / Self Care | Payer: PPO | Source: Ambulatory Visit | Attending: Family Medicine | Admitting: Family Medicine

## 2024-01-06 DIAGNOSIS — Z1231 Encounter for screening mammogram for malignant neoplasm of breast: Secondary | ICD-10-CM | POA: Diagnosis not present

## 2024-01-16 ENCOUNTER — Inpatient Hospital Stay
Admission: EM | Admit: 2024-01-16 | Discharge: 2024-01-19 | DRG: 378 | Disposition: A | Discharge status: Home / Self Care | Payer: PPO | Attending: Internal Medicine | Admitting: Internal Medicine

## 2024-01-16 ENCOUNTER — Other Ambulatory Visit: Payer: Self-pay

## 2024-01-16 DIAGNOSIS — T39015A Adverse effect of aspirin, initial encounter: Secondary | ICD-10-CM | POA: Diagnosis present

## 2024-01-16 DIAGNOSIS — R944 Abnormal results of kidney function studies: Secondary | ICD-10-CM | POA: Diagnosis not present

## 2024-01-16 DIAGNOSIS — K921 Melena: Secondary | ICD-10-CM

## 2024-01-16 DIAGNOSIS — R600 Localized edema: Secondary | ICD-10-CM | POA: Diagnosis present

## 2024-01-16 DIAGNOSIS — K920 Hematemesis: Secondary | ICD-10-CM | POA: Diagnosis not present

## 2024-01-16 DIAGNOSIS — I9589 Other hypotension: Secondary | ICD-10-CM

## 2024-01-16 DIAGNOSIS — E1165 Type 2 diabetes mellitus with hyperglycemia: Secondary | ICD-10-CM | POA: Diagnosis not present

## 2024-01-16 DIAGNOSIS — Z8042 Family history of malignant neoplasm of prostate: Secondary | ICD-10-CM | POA: Diagnosis not present

## 2024-01-16 DIAGNOSIS — Z9049 Acquired absence of other specified parts of digestive tract: Secondary | ICD-10-CM | POA: Diagnosis not present

## 2024-01-16 DIAGNOSIS — D649 Anemia, unspecified: Secondary | ICD-10-CM | POA: Diagnosis present

## 2024-01-16 DIAGNOSIS — I959 Hypotension, unspecified: Secondary | ICD-10-CM | POA: Diagnosis present

## 2024-01-16 DIAGNOSIS — Z807 Family history of other malignant neoplasms of lymphoid, hematopoietic and related tissues: Secondary | ICD-10-CM | POA: Diagnosis not present

## 2024-01-16 DIAGNOSIS — D62 Acute posthemorrhagic anemia: Secondary | ICD-10-CM | POA: Diagnosis not present

## 2024-01-16 DIAGNOSIS — E669 Obesity, unspecified: Secondary | ICD-10-CM | POA: Diagnosis present

## 2024-01-16 DIAGNOSIS — F39 Unspecified mood [affective] disorder: Secondary | ICD-10-CM | POA: Diagnosis present

## 2024-01-16 DIAGNOSIS — Z833 Family history of diabetes mellitus: Secondary | ICD-10-CM | POA: Diagnosis not present

## 2024-01-16 DIAGNOSIS — R000 Tachycardia, unspecified: Secondary | ICD-10-CM | POA: Diagnosis not present

## 2024-01-16 DIAGNOSIS — F17210 Nicotine dependence, cigarettes, uncomplicated: Secondary | ICD-10-CM | POA: Diagnosis present

## 2024-01-16 DIAGNOSIS — Z79891 Long term (current) use of opiate analgesic: Secondary | ICD-10-CM

## 2024-01-16 DIAGNOSIS — Z860100 Personal history of colon polyps, unspecified: Secondary | ICD-10-CM

## 2024-01-16 DIAGNOSIS — I10 Essential (primary) hypertension: Secondary | ICD-10-CM | POA: Diagnosis not present

## 2024-01-16 DIAGNOSIS — G894 Chronic pain syndrome: Secondary | ICD-10-CM | POA: Diagnosis present

## 2024-01-16 DIAGNOSIS — Z79899 Other long term (current) drug therapy: Secondary | ICD-10-CM

## 2024-01-16 DIAGNOSIS — K269 Duodenal ulcer, unspecified as acute or chronic, without hemorrhage or perforation: Secondary | ICD-10-CM | POA: Diagnosis not present

## 2024-01-16 DIAGNOSIS — Z8249 Family history of ischemic heart disease and other diseases of the circulatory system: Secondary | ICD-10-CM | POA: Diagnosis not present

## 2024-01-16 DIAGNOSIS — E785 Hyperlipidemia, unspecified: Secondary | ICD-10-CM | POA: Diagnosis not present

## 2024-01-16 DIAGNOSIS — I890 Lymphedema, not elsewhere classified: Secondary | ICD-10-CM | POA: Diagnosis present

## 2024-01-16 DIAGNOSIS — K922 Gastrointestinal hemorrhage, unspecified: Principal | ICD-10-CM | POA: Diagnosis present

## 2024-01-16 DIAGNOSIS — Z8719 Personal history of other diseases of the digestive system: Secondary | ICD-10-CM | POA: Diagnosis not present

## 2024-01-16 DIAGNOSIS — Z803 Family history of malignant neoplasm of breast: Secondary | ICD-10-CM

## 2024-01-16 DIAGNOSIS — K624 Stenosis of anus and rectum: Secondary | ICD-10-CM | POA: Diagnosis not present

## 2024-01-16 DIAGNOSIS — K264 Chronic or unspecified duodenal ulcer with hemorrhage: Principal | ICD-10-CM | POA: Diagnosis present

## 2024-01-16 LAB — COMPREHENSIVE METABOLIC PANEL
ALT: 12 U/L (ref 0–44)
AST: 14 U/L — ABNORMAL LOW (ref 15–41)
Albumin: 2.8 g/dL — ABNORMAL LOW (ref 3.5–5.0)
Alkaline Phosphatase: 31 U/L — ABNORMAL LOW (ref 38–126)
Anion gap: 10 (ref 5–15)
BUN: 54 mg/dL — ABNORMAL HIGH (ref 8–23)
CO2: 26 mmol/L (ref 22–32)
Calcium: 8.6 mg/dL — ABNORMAL LOW (ref 8.9–10.3)
Chloride: 99 mmol/L (ref 98–111)
Creatinine, Ser: 0.81 mg/dL (ref 0.44–1.00)
GFR, Estimated: >60 mL/min (ref >60)
Glucose, Bld: 123 mg/dL — ABNORMAL HIGH (ref 70–99)
Potassium: 4.1 mmol/L (ref 3.5–5.1)
Sodium: 135 mmol/L (ref 135–145)
Total Bilirubin: 0.4 mg/dL (ref 0.0–1.2)
Total Protein: 5.7 g/dL — ABNORMAL LOW (ref 6.5–8.1)

## 2024-01-16 LAB — CBC
HCT: 28.7 % — ABNORMAL LOW (ref 36.0–46.0)
Hemoglobin: 9.2 g/dL — ABNORMAL LOW (ref 12.0–15.0)
MCH: 28.2 pg (ref 26.0–34.0)
MCHC: 32.1 g/dL (ref 30.0–36.0)
MCV: 88 fL (ref 80.0–100.0)
Platelets: 294 10*3/uL (ref 150–400)
RBC: 3.26 MIL/uL — ABNORMAL LOW (ref 3.87–5.11)
RDW: 14.6 % (ref 11.5–15.5)
WBC: 9.2 10*3/uL (ref 4.0–10.5)
nRBC: 0 % (ref 0.0–0.2)

## 2024-01-16 LAB — TROPONIN I (HIGH SENSITIVITY)
Troponin I (High Sensitivity): 5 ng/L (ref <18)
Troponin I (High Sensitivity): 6 ng/L (ref <18)

## 2024-01-16 LAB — TYPE AND SCREEN

## 2024-01-16 LAB — PROTIME-INR
INR: 1 (ref 0.8–1.2)
Prothrombin Time: 13.5 s (ref 11.4–15.2)

## 2024-01-16 LAB — ABO/RH: ABO/RH(D): A POS

## 2024-01-16 LAB — PREPARE RBC (CROSSMATCH)

## 2024-01-16 LAB — LIPASE, BLOOD: Lipase: 29 U/L (ref 11–51)

## 2024-01-16 MED ORDER — SODIUM CHLORIDE 0.9 % IV SOLN: 10.0000 mL/h | Freq: Once | INTRAVENOUS | Status: DC

## 2024-01-16 MED ORDER — MORPHINE SULFATE ER 15 MG PO TBCR
15.0000 mg | EXTENDED_RELEASE_TABLET | Freq: Three times a day (TID) | ORAL | Status: DC | PRN
  Administered 2024-01-17 – 2024-01-19 (×5): 15 mg via ORAL
  Filled 2024-01-16 (×5): qty 1

## 2024-01-16 MED ORDER — ATORVASTATIN CALCIUM 20 MG PO TABS
20.0000 mg | ORAL_TABLET | Freq: Every day | ORAL | Status: DC
  Administered 2024-01-17 – 2024-01-19 (×3): 20 mg via ORAL
  Filled 2024-01-16 (×2): qty 1
  Filled 2024-01-16: qty 2

## 2024-01-16 MED ORDER — GABAPENTIN 300 MG PO CAPS
600.0000 mg | ORAL_CAPSULE | Freq: Two times a day (BID) | ORAL | Status: DC
  Administered 2024-01-16 – 2024-01-19 (×6): 600 mg via ORAL
  Filled 2024-01-16 (×6): qty 2

## 2024-01-16 MED ORDER — ONDANSETRON HCL 4 MG/2ML IJ SOLN
4.0000 mg | Freq: Once | INTRAMUSCULAR | Status: AC
  Administered 2024-01-16: 4 mg via INTRAVENOUS
  Filled 2024-01-16: qty 2

## 2024-01-16 MED ORDER — SODIUM CHLORIDE 0.9% FLUSH
3.0000 mL | Freq: Two times a day (BID) | INTRAVENOUS | Status: DC
  Administered 2024-01-16 – 2024-01-18 (×5): 3 mL via INTRAVENOUS

## 2024-01-16 MED ORDER — ONDANSETRON HCL 4 MG PO TABS: 4.0000 mg | ORAL_TABLET | Freq: Four times a day (QID) | ORAL | Status: DC | PRN

## 2024-01-16 MED ORDER — PANTOPRAZOLE SODIUM 40 MG IV SOLR
80.0000 mg | Freq: Once | INTRAVENOUS | Status: AC
  Administered 2024-01-16: 80 mg via INTRAVENOUS
  Filled 2024-01-16: qty 20

## 2024-01-16 MED ORDER — PANTOPRAZOLE SODIUM 40 MG IV SOLR
40.0000 mg | Freq: Two times a day (BID) | INTRAVENOUS | Status: DC
  Administered 2024-01-16 – 2024-01-19 (×6): 40 mg via INTRAVENOUS
  Filled 2024-01-16 (×6): qty 10

## 2024-01-16 MED ORDER — OXYCODONE HCL 5 MG PO TABS
10.0000 mg | ORAL_TABLET | Freq: Four times a day (QID) | ORAL | Status: DC | PRN
  Administered 2024-01-17 – 2024-01-19 (×7): 10 mg via ORAL
  Filled 2024-01-16 (×8): qty 2

## 2024-01-16 MED ORDER — ONDANSETRON HCL 4 MG/2ML IJ SOLN: 4.0000 mg | Freq: Four times a day (QID) | INTRAMUSCULAR | Status: DC | PRN

## 2024-01-16 MED ORDER — DULOXETINE HCL 30 MG PO CPEP
30.0000 mg | ORAL_CAPSULE | Freq: Every day | ORAL | Status: DC
  Administered 2024-01-16 – 2024-01-19 (×4): 30 mg via ORAL
  Filled 2024-01-16 (×4): qty 1

## 2024-01-16 MED ORDER — ACETAMINOPHEN 650 MG RE SUPP: 650.0000 mg | Freq: Four times a day (QID) | RECTAL | Status: DC | PRN

## 2024-01-16 MED ORDER — LACTATED RINGERS IV SOLN: INTRAVENOUS | Status: AC

## 2024-01-16 MED ORDER — SODIUM CHLORIDE 0.9 % IV BOLUS
500.0000 mL | Freq: Once | INTRAVENOUS | Status: AC
  Administered 2024-01-16: 500 mL via INTRAVENOUS

## 2024-01-16 MED ORDER — ACETAMINOPHEN 325 MG PO TABS: 650.0000 mg | ORAL_TABLET | Freq: Four times a day (QID) | ORAL | Status: DC | PRN

## 2024-01-16 MED ORDER — INSULIN ASPART 100 UNIT/ML IJ SOLN: 0.0000 [IU] | Freq: Three times a day (TID) | INTRAMUSCULAR | Status: DC

## 2024-01-16 NOTE — Assessment & Plan Note (Signed)
-   Continue home regimen of morphine 15 mg 3 times daily as needed, oxycodone 10 mg 4 times daily as needed, and gabapentin 600 mg 3 times daily.  PDMP reviewed and appropriate

## 2024-01-16 NOTE — Assessment & Plan Note (Signed)
Patient presenting with hemoglobin of 9.2 complicated by tachycardia and hypotension.  Previous hemoglobin of 14.0.  - 1 unit of packed RBC ordered - Posttransfusion CBC - Continue to transfuse for hemoglobin less than 7 or hemodynamic instability

## 2024-01-16 NOTE — Assessment & Plan Note (Signed)
-  Hold home antihypertensives in the setting of hypotension

## 2024-01-16 NOTE — Assessment & Plan Note (Signed)
In the setting of GI bleed and associated blood loss.  - IV fluids - Packed RBCs ordered as noted above - Hold home antihypertensives

## 2024-01-16 NOTE — ED Triage Notes (Signed)
Pt was treated for diverticulitis w/ 10 days of antibiotics and reports coffee grounds emesis and stool and blood in stool since yesterday. Pt reports dizziness and palpitations, and mild SHOB. Pt AOX4, NAD noted.

## 2024-01-16 NOTE — Assessment & Plan Note (Signed)
Potentially in the setting of NSAID use for abdominal pain (Goody powder).  No prior history of GI bleed.  - GI consulted; appreciate their recommendations - Protonix 80 mg once, continue Protonix 40 mg twice daily - N.p.o. - If further hemodynamic compromise occurs, will order CTA

## 2024-01-16 NOTE — Consult Note (Signed)
Consultation  Referring Provider:  Hospitalist    Admit date: 01/16/2024 Consult date: 01/16/2024         Reason for Consultation: Melena/Coffee ground emesis              HPI:   Erica Ruiz is a 63 y.o. lady with history of chronic pain on opioids, DM II, and hypertension who has been having melena for 3-4 days and an episode of coffee ground emesis today. She was seen at an outside urgent care with epigastric pain and was treated empirically for diverticulitis. No imaging was done. She also started taking goodies powders during this time for the pain. No blood thinners. No family history of GI malignancies. She has a history of partial colectomy for diverticulitis. Last colonoscopy was in October of last year with a small polyp found. She is hemodynamically stable. She has an elevated BUN/Cr ratio.  Past Medical History:  Diagnosis Date   Back pain    Diabetes mellitus without complication (HCC)    Hypertension     Past Surgical History:  Procedure Laterality Date   BREAST BIOPSY Right    neg bx/clip   CHOLECYSTECTOMY     COLON SURGERY     COLONOSCOPY WITH PROPOFOL N/A 08/23/2018   Procedure: COLONOSCOPY WITH PROPOFOL with biopsies;  Surgeon: Midge Minium, MD;  Location: [facility];  Service: Endoscopy;  Laterality: N/A;  DIABETIC   COLONOSCOPY WITH PROPOFOL N/A 10/13/2023   Procedure: COLONOSCOPY WITH BIOPSY;  Surgeon: Midge Minium, MD;  Location: [facility];  Service: Endoscopy;  Laterality: N/A;  Diabetic   COLOSTOMY REVERSAL     KNEE SURGERY     POLYPECTOMY N/A 08/23/2018   Procedure: POLYPECTOMY;  Surgeon: Midge Minium, MD;  Location: [facility];  Service: Endoscopy;  Laterality: N/A;   POLYPECTOMY  10/13/2023   Procedure: POLYPECTOMY;  Surgeon: Midge Minium, MD;  Location: [facility];  Service: Endoscopy;;   TONSILLECTOMY     TUBAL LIGATION      Family History  Problem Relation Age of Onset   Pulmonary embolism Mother    Cancer  Father        prostate   Heart disease Father    Hypertension Father    Cancer Sister        non-hodgkin's lymphoma   Hepatitis C Sister    Cancer Brother        colon   Diabetes Brother    Heart disease Brother    Hypertension Brother    Diabetes Daughter    Diabetes Brother    Hypertension Sister    Cancer Brother        prostate   Breast cancer Paternal Aunt     Social History   Tobacco Use   Smoking status: Every Day    Current packs/day: 0.50    Average packs/day: 0.5 packs/day for 30.1 years (15.0 ttl pk-yrs)    Types: Cigarettes    Start date: 1995   Smokeless tobacco: Never  Vaping Use   Vaping status: Never Used  Substance Use Topics   Alcohol use: Never   Drug use: Never    Prior to Admission medications   Medication Sig Start Date End Date Taking? Authorizing Provider  atorvastatin (LIPITOR) 10 MG tablet TAKE 1 TABLET BY MOUTH ONCE EVERY EVENING 10/08/23   Duanne Limerick, MD  DULoxetine (CYMBALTA) 30 MG capsule TK ONE C PO ONCE A DAY FOR 90 DAYS- pain management 03/18/18   [provider]  fluticasone (FLONASE) 50 MCG/ACT nasal spray Place 2 sprays into both nostrils daily. 06/17/20   Junie Spencer, FNP  gabapentin (NEURONTIN) 600 MG tablet TK 1 T PO BID- pain management 03/19/18   [provider]  glucose blood test strip 1 each by Other route 2 (two) times daily. 10/06/18   Duanne Limerick, MD  hydrochlorothiazide (HYDRODIURIL) 25 MG tablet Take 1 tablet (25 mg total) by mouth daily. 10/08/23   Duanne Limerick, MD  Lancets [facility] ULTRASOFT) lancets 1 each by Other route 2 (two) times daily. 10/06/18   Duanne Limerick, MD  lisinopril (ZESTRIL) 10 MG tablet Take 1 tablet (10 mg total) by mouth daily. 10/08/23   Duanne Limerick, MD  morphine (MS CONTIN) 15 MG 12 hr tablet TK 1 T PO  Q 8 H FOR 30 DAYS- pain management 03/27/18   [provider]  NARCAN 4 MG/0.1ML LIQD nasal spray kit NAR REP ALN 07/11/19   [provider]   ondansetron (ZOFRAN-ODT) 8 MG disintegrating tablet Take 1 tablet (8 mg total) by mouth every 8 (eight) hours as needed for nausea or vomiting. 01/04/24   Becky Augusta, NP  Oxycodone HCl 10 MG TABS Take 10 mg by mouth 4 (four) times daily as needed. Pain clinic 11/05/20   [provider]  Probiotic Product (PROBIOTIC DAILY PO) Take by mouth daily.    [provider]  SYNJARDY XR 12.04-999 MG TB24 Take 2 tablets by mouth daily. Gershon Crane 01/21/22   [provider]  tiZANidine (ZANAFLEX) 4 MG tablet TK 1 T PO Q 8 H PRF SPASMS- pain management 03/18/18   [provider]    Current Facility-Administered Medications  Medication Dose Route Frequency Provider Last Rate Last Admin   0.9 %  sodium chloride infusion  10 mL/hr Intravenous Once Corena Herter, MD   Held at 01/16/24 1749   pantoprazole (PROTONIX) injection 40 mg  40 mg Intravenous Q12H Verdene Lennert, MD       Current Outpatient Medications  Medication Sig Dispense Refill   atorvastatin (LIPITOR) 10 MG tablet TAKE 1 TABLET BY MOUTH ONCE EVERY EVENING 90 tablet 1   DULoxetine (CYMBALTA) 30 MG capsule TK ONE C PO ONCE A DAY FOR 90 DAYS- pain management  3   fluticasone (FLONASE) 50 MCG/ACT nasal spray Place 2 sprays into both nostrils daily. 16 g 6   gabapentin (NEURONTIN) 600 MG tablet TK 1 T PO BID- pain management  3   glucose blood test strip 1 each by Other route 2 (two) times daily. 100 each 0   hydrochlorothiazide (HYDRODIURIL) 25 MG tablet Take 1 tablet (25 mg total) by mouth daily. 90 tablet 1   Lancets (ONETOUCH ULTRASOFT) lancets 1 each by Other route 2 (two) times daily. 100 each 0   lisinopril (ZESTRIL) 10 MG tablet Take 1 tablet (10 mg total) by mouth daily. 90 tablet 1   morphine (MS CONTIN) 15 MG 12 hr tablet TK 1 T PO  Q 8 H FOR 30 DAYS- pain management  0   NARCAN 4 MG/0.1ML LIQD nasal spray kit NAR REP ALN     ondansetron (ZOFRAN-ODT) 8 MG disintegrating tablet Take 1 tablet (8 mg total)  by mouth every 8 (eight) hours as needed for nausea or vomiting. 20 tablet 0   Oxycodone HCl 10 MG TABS Take 10 mg by mouth 4 (four) times daily as needed. Pain clinic     Probiotic Product (PROBIOTIC DAILY PO) Take  by mouth daily.     SYNJARDY XR 12.04-999 MG TB24 Take 2 tablets by mouth daily. [place]     tiZANidine (ZANAFLEX) 4 MG tablet TK 1 T PO Q 8 H PRF SPASMS- pain management  3    Allergies as of 01/16/2024   (No Known Allergies)     Review of Systems:    All systems reviewed and negative except where noted in HPI.  Review of Systems  Constitutional:  Negative for chills and fever.  Respiratory:  Negative for shortness of breath.   Cardiovascular:  Negative for chest pain.  Gastrointestinal:  Positive for abdominal pain, melena and vomiting.  Musculoskeletal:  Positive for back pain.  Skin:  Negative for rash.  Neurological:  Negative for focal weakness.  Psychiatric/Behavioral:  Negative for substance abuse.   All other systems reviewed and are negative.      Physical Exam:  Vital signs in last 24 hours: Temp:  [99.5 F (37.5 C)] 99.5 F (37.5 C) (02/01 1420) Pulse Rate:  [107-120] 107 (02/01 1600) Resp:  [19-20] 19 (02/01 1600) BP: (93-96)/(59) 96/59 (02/01 1600) SpO2:  [95 %-98 %] 98 % (02/01 1600)   General:   Pleasant in NAD Head:  Normocephalic and atraumatic. Eyes:   No icterus.   Conjunctiva pink. Mouth: Mucosa pink moist, no lesions. Neck:  Supple; no masses felt Lungs: No respiratory distress Heart:  S1S2, RRR, no MRG. No edema. Abdomen:   Flat, soft, nondistended, non-tender Msk:  Strength 5/5. Symmetrical without gross deformities. Neurologic:  Alert and  oriented x4;  No focal deficits Skin:  Warm, dry, pink without significant lesions or rashes. Psych:  Alert and cooperative. Normal affect.  LAB RESULTS: Recent Labs    01/16/24 1423  WBC 9.2  HGB 9.2*  HCT 28.7*  PLT 294   BMET Recent Labs    01/16/24 1557  NA 135  K 4.1  CL  99  CO2 26  GLUCOSE 123*  BUN 54*  CREATININE 0.81  CALCIUM 8.6*   LFT Recent Labs    01/16/24 1557  PROT 5.7*  ALBUMIN 2.8*  AST 14*  ALT 12  ALKPHOS 31*  BILITOT 0.4   PT/INR Recent Labs    01/16/24 1423  LABPROT 13.5  INR 1.0    STUDIES: No results found.     Impression / Plan:   63 y/o lady with history of chronic pain and now melena/coffee ground emesis in the setting of NSAID use concerning for PUD  - will plan on EGD tomorrow - clear liquids and NPO at midnight - maintain active type and screen - daily CBC - transfuse to keep hemoglobin > 7 - PPI IV BID  Further recs after EGD  Merlyn Lot MD, MPH [facility]

## 2024-01-16 NOTE — ED Notes (Signed)
Pt up to the bathroom at this time. Pt denies any dizziness. Gait steady.

## 2024-01-16 NOTE — ED Provider Triage Note (Signed)
Emergency Medicine Provider Triage Evaluation Note  Erica Ruiz , a 63 y.o. female  was evaluated in triage.  Pt complains of coffee-ground emesis and bloody and black stool for the past 2 days.  She reports that she was recently treated for diverticulitis from urgent care, did not have any scans done but it was empiric treatment.  She reports that she had epigastric abdominal pain.  Also recently had colonoscopy and endoscopy in the fall. Reports abd pain improved. No blood thinners.  Review of Systems  Positive: N/v/d with blood Negative: fever  Physical Exam  There were no vitals taken for this visit. Gen:   Awake, no distress   Resp:  Normal effort  MSK:   Moves extremities without difficulty  Other:    Medical Decision Making  Medically screening exam initiated at 2:19 PM.  Appropriate orders placed.  Erica Ruiz [place] was informed that the remainder of the evaluation will be completed by another provider, this initial triage assessment does not replace that evaluation, and the importance of remaining in the ED until their evaluation is complete.     Jackelyn Hoehn, PA-C 01/16/24 1422

## 2024-01-16 NOTE — Assessment & Plan Note (Signed)
On examination, appears more consistent with lymphedema.  Nonpitting, with no history of heart failure.

## 2024-01-16 NOTE — ED Notes (Signed)
Per Blood bank. New collection sample of Pink tube hemolyzed. Per blood bank, they are sending lab down for blood draw.

## 2024-01-16 NOTE — Assessment & Plan Note (Addendum)
Well-controlled type 2 diabetes with last A1c of 6.6% approximately 8 months prior.  - A1c pending - SSI, very sensitive given high risk for hypoglycemia - Hold home regimen

## 2024-01-16 NOTE — ED Provider Notes (Signed)
[facility] Provider Note    Event Date/Time   First MD Initiated Contact with Patient 01/16/24 1517     (approximate)   History   Hematemesis   HPI  Erica Ruiz is a 63 y.o. female past medical tree significant for hypertension, presents to the emergency department with hematemesis.  Patient states that she had a recent colonoscopy and was told that she had a colon polyp.  Over the past couple of days he developed upper abdominal pain.  States that she is recently been on an antibiotic for thoughts of possible diverticulitis.  Has been taking Goody powder for abdominal pain.  States that she had significantly worsening upper abdominal pain associated with black dark tarry stools yesterday and then an episode of vomiting blood today.  Denies any alcohol use.  Not on anticoagulation.  No prior upper endoscopy.     Physical Exam   Triage Vital Signs: ED Triage Vitals [01/16/24 1420]  Encounter Vitals Group     BP (!) 93/59     Systolic BP Percentile      Diastolic BP Percentile      Pulse Rate (!) 120     Resp 20     Temp 99.5 F (37.5 C)     Temp Source Oral     SpO2 95 %     Weight      Height      Head Circumference      Peak Flow      Pain Score 4     Pain Loc      Pain Education      Exclude from Growth Chart     Most recent vital signs: Vitals:   01/16/24 1420 01/16/24 1600  BP: (!) 93/59 (!) 96/59  Pulse: (!) 120 (!) 107  Resp: 20 19  Temp: 99.5 F (37.5 C)   SpO2: 95% 98%    Physical Exam Exam conducted with a chaperone present.  Constitutional:      Appearance: She is well-developed.  HENT:     Head: Atraumatic.  Eyes:     Conjunctiva/sclera: Conjunctivae normal.  Cardiovascular:     Rate and Rhythm: Regular rhythm. Tachycardia present.  Pulmonary:     Effort: No respiratory distress.  Abdominal:     General: There is no distension.     Tenderness: There is abdominal tenderness (Epigastric).  Genitourinary:     Comments: Melanotic stool Musculoskeletal:        General: Normal range of motion.     Cervical back: Normal range of motion.  Skin:    General: Skin is warm.     Capillary Refill: Capillary refill takes less than 2 seconds.  Neurological:     Mental Status: She is alert. Mental status is at baseline.     IMPRESSION / MDM / ASSESSMENT AND PLAN / ED COURSE  I reviewed the triage vital signs and the nursing notes.  Differential diagnosis including lower GI bleed, upper GI bleed, peptic ulcer disease/gastritis.  Patient denies any history of alcohol use.  Denies any anticoagulation.  On arrival soft blood pressure, tachycardic to 120.  Patient was typed and screened.  EKG  I, Corena Herter, the attending physician, personally viewed and interpreted this ECG.  Sinus tachycardia.  Normal intervals.  No chamber enlargement.  No significant ST elevation or depression.  No findings of acute ischemia or dysrhythmia.  Sinus tachycardia while on cardiac telemetry.  LABS (all labs ordered are listed,  but only abnormal results are displayed) Labs interpreted as -    Labs Reviewed  CBC - Abnormal; Notable for the following components:      Result Value   RBC 3.26 (*)    Hemoglobin 9.2 (*)    HCT 28.7 (*)    All other components within normal limits  COMPREHENSIVE METABOLIC PANEL - Abnormal; Notable for the following components:   Glucose, Bld 123 (*)    BUN 54 (*)    Calcium 8.6 (*)    Total Protein 5.7 (*)    Albumin 2.8 (*)    AST 14 (*)    Alkaline Phosphatase 31 (*)    All other components within normal limits  PROTIME-INR  LIPASE, BLOOD  POC OCCULT BLOOD, ED  TYPE AND SCREEN  TYPE AND SCREEN  TROPONIN I (HIGH SENSITIVITY)  TROPONIN I (HIGH SENSITIVITY)     MDM    Patient presents to the emergency department with findings concerning for significant upper GI bleed likely in the setting of Goody powder use.  Denies any alcohol use.  Patient was typed and  screened.  Hemoglobin with significant drop of 9.2 from a baseline of 14.  Rectal exam with melena.  Concern for upper GI bleed.  Given a 500 bolus of IV fluids.  Given IV Protonix and IV Zofran.  Patient with significantly elevated BUN of 54 from a baseline of 11.  Lipase normal.  Normal LFTs.  Plan for admission for upper GI bleed, consulted hospitalist for admission for upper GI bleed with significant anemia   PROCEDURES:  Critical Care performed: yes  .Critical Care  Performed by: Corena Herter, MD Authorized by: Corena Herter, MD   Critical care provider statement:    Critical care time (minutes):  30   Critical care time was exclusive of:  Separately billable procedures and treating other patients   Critical care was necessary to treat or prevent imminent or life-threatening deterioration of the following conditions:  Circulatory failure   Critical care was time spent personally by me on the following activities:  Development of treatment plan with patient or surrogate, discussions with consultants, evaluation of patient's response to treatment, examination of patient, ordering and review of laboratory studies, ordering and review of radiographic studies, ordering and performing treatments and interventions, pulse oximetry, re-evaluation of patient's condition and review of old charts   Care discussed with: admitting provider     Patient's presentation is most consistent with acute presentation with potential threat to life or bodily function.   MEDICATIONS ORDERED IN ED: Medications  ondansetron (ZOFRAN) injection 4 mg (4 mg Intravenous Given 01/16/24 1600)  sodium chloride 0.9 % bolus 500 mL (500 mLs Intravenous New Bag/Given 01/16/24 1605)  pantoprazole (PROTONIX) injection 80 mg (80 mg Intravenous Given 01/16/24 1600)    FINAL CLINICAL IMPRESSION(S) / ED DIAGNOSES   Final diagnoses:  Upper GI bleed  Hematemesis with nausea  Melena     Rx / DC Orders   ED Discharge  Orders     None        Note:  This document was prepared using Dragon voice recognition software and may include unintentional dictation errors.   Corena Herter, MD 01/16/24 [phone]

## 2024-01-16 NOTE — H&P (Signed)
History and Physical    Patient: Erica Ruiz ION:[id number] DOB: Mar 10, 1961 DOA: 01/16/2024 DOS: the patient was seen and examined on 01/16/2024 PCP: Duanne Limerick, MD  Patient coming from: Home  Chief Complaint:  Chief Complaint  Patient presents with   Hematemesis   HPI: Erica Ruiz is a 63 y.o. female with medical history significant of Type 2 diabetes, chronic pain syndrome, hypertension, hyperlipidemia, who presents to the ED due to hematemesis.  Erica Ruiz states that she had been experiencing abdominal pain for several weeks and went to an urgent care on 1/21, at which time she was started on Augmentin.  Since that visit, she has been taking Goody powder to help with the pain.  She completed the antibiotics yesterday and noted that her GI pain had improved quite a bit.  However she subsequently developed melanotic stool in addition to dizziness/palpitations with exertion.  Then today, she had multiple episodes of coffee-ground emesis.  She denies any shortness of breath or chest pain at this time.    She notes chronic lower extremity edema that is worse now as she has been sleeping sitting up due to the abdominal pain.  She denies any known history of heart failure.  ED course: On arrival to the ED, patient was hypotensive at 93/59 with heart rate of 120.  She was saturating at 95% on room air.  She was afebrile at 99.5.  Initial workup notable for hemoglobin of 9.2, BUN of 54, glucose 123, creat 0.81, and GFR above 60.  INR 1.0.  Patient started on Protonix and GI consulted.  TRH contacted for admission.   Review of Systems: As mentioned in the history of present illness. All other systems reviewed and are negative.  Past Medical History:  Diagnosis Date   Back pain    Diabetes mellitus without complication (HCC)    Hypertension    Past Surgical History:  Procedure Laterality Date   BREAST BIOPSY Right    neg bx/clip   CHOLECYSTECTOMY     COLON SURGERY      COLONOSCOPY WITH PROPOFOL N/A 08/23/2018   Procedure: COLONOSCOPY WITH PROPOFOL with biopsies;  Surgeon: Midge Minium, MD;  Location: [facility];  Service: Endoscopy;  Laterality: N/A;  DIABETIC   COLONOSCOPY WITH PROPOFOL N/A 10/13/2023   Procedure: COLONOSCOPY WITH BIOPSY;  Surgeon: Midge Minium, MD;  Location: [facility];  Service: Endoscopy;  Laterality: N/A;  Diabetic   COLOSTOMY REVERSAL     KNEE SURGERY     POLYPECTOMY N/A 08/23/2018   Procedure: POLYPECTOMY;  Surgeon: Midge Minium, MD;  Location: [facility];  Service: Endoscopy;  Laterality: N/A;   POLYPECTOMY  10/13/2023   Procedure: POLYPECTOMY;  Surgeon: Midge Minium, MD;  Location: [facility];  Service: Endoscopy;;   TONSILLECTOMY     TUBAL LIGATION     Social History:  reports that she has been smoking cigarettes. She started smoking about 30 years ago. She has a 15 pack-year smoking history. She has never used smokeless tobacco. She reports that she does not drink alcohol and does not use drugs.  No Known Allergies  Family History  Problem Relation Age of Onset   Pulmonary embolism Mother    Cancer Father        prostate   Heart disease Father    Hypertension Father    Cancer Sister        non-hodgkin's lymphoma   Hepatitis C Sister    Cancer Brother  colon   Diabetes Brother    Heart disease Brother    Hypertension Brother    Diabetes Daughter    Diabetes Brother    Hypertension Sister    Cancer Brother        prostate   Breast cancer Paternal Aunt     Prior to Admission medications   Medication Sig Start Date End Date Taking? Authorizing Provider  atorvastatin (LIPITOR) 10 MG tablet TAKE 1 TABLET BY MOUTH ONCE EVERY EVENING 10/08/23   Duanne Limerick, MD  DULoxetine (CYMBALTA) 30 MG capsule TK ONE C PO ONCE A DAY FOR 90 DAYS- pain management 03/18/18   [provider]  fluticasone (FLONASE) 50 MCG/ACT nasal spray Place 2 sprays into both nostrils daily. 06/17/20    Junie Spencer, FNP  gabapentin (NEURONTIN) 600 MG tablet TK 1 T PO BID- pain management 03/19/18   [provider]  glucose blood test strip 1 each by Other route 2 (two) times daily. 10/06/18   Duanne Limerick, MD  hydrochlorothiazide (HYDRODIURIL) 25 MG tablet Take 1 tablet (25 mg total) by mouth daily. 10/08/23   Duanne Limerick, MD  Lancets [facility] ULTRASOFT) lancets 1 each by Other route 2 (two) times daily. 10/06/18   Duanne Limerick, MD  lisinopril (ZESTRIL) 10 MG tablet Take 1 tablet (10 mg total) by mouth daily. 10/08/23   Duanne Limerick, MD  morphine (MS CONTIN) 15 MG 12 hr tablet TK 1 T PO  Q 8 H FOR 30 DAYS- pain management 03/27/18   [provider]  NARCAN 4 MG/0.1ML LIQD nasal spray kit NAR REP ALN 07/11/19   [provider]  ondansetron (ZOFRAN-ODT) 8 MG disintegrating tablet Take 1 tablet (8 mg total) by mouth every 8 (eight) hours as needed for nausea or vomiting. 01/04/24   Becky Augusta, NP  Oxycodone HCl 10 MG TABS Take 10 mg by mouth 4 (four) times daily as needed. Pain clinic 11/05/20   [provider]  Probiotic Product (PROBIOTIC DAILY PO) Take by mouth daily.    [provider]  SYNJARDY XR 12.04-999 MG TB24 Take 2 tablets by mouth daily. Gershon Crane 01/21/22   [provider]  tiZANidine (ZANAFLEX) 4 MG tablet TK 1 T PO Q 8 H PRF SPASMS- pain management 03/18/18   [provider]    Physical Exam: Vitals:   01/16/24 1420 01/16/24 1600 01/16/24 1758  BP: (!) 93/59 (!) 96/59   Pulse: (!) 120 (!) 107   Resp: 20 19   Temp: 99.5 F (37.5 C)  (!) 97.5 F (36.4 C)  TempSrc: Oral  Oral  SpO2: 95% 98%    Physical Exam Vitals and nursing note reviewed.  Constitutional:      Appearance: She is normal weight.  HENT:     Head: Normocephalic and atraumatic.  Cardiovascular:     Rate and Rhythm: Regular rhythm. Tachycardia present.     Heart sounds: No murmur heard. Pulmonary:     Effort: Pulmonary effort is  normal. No respiratory distress.     Breath sounds: Normal breath sounds.  Abdominal:     General: Bowel sounds are normal. There is no distension.     Palpations: Abdomen is soft.     Tenderness: There is no abdominal tenderness. There is no guarding.  Musculoskeletal:     Comments: Nonpitting edema of bilateral lower extremities  Skin:    General: Skin is warm and dry.  Neurological:     General: No  focal deficit present.     Mental Status: She is alert and oriented to person, place, and time. Mental status is at baseline.  Psychiatric:        Mood and Affect: Mood normal.        Behavior: Behavior normal.    Data Reviewed: CBC with WBC of 9.2, hemoglobin of 9.2, platelets of 295 CMP with sodium of 135, potassium 4.1, bicarb 26, glucose 123, BUN 54, creatinine 0.81, AST 14, ALT 12, GFR above 60 Troponin negative x 2 INR 1.0  EKG personally reviewed.  Sinus rhythm with rate of 123.  No ischemic appearing changes.  There are no new results to review at this time.  Assessment and Plan:  * Acute upper GI bleed Potentially in the setting of NSAID use for abdominal pain (Goody powder).  No prior history of GI bleed.  - GI consulted; appreciate their recommendations - Protonix 80 mg once, continue Protonix 40 mg twice daily - N.p.o. - If further hemodynamic compromise occurs, will order CTA  Symptomatic anemia Patient presenting with hemoglobin of 9.2 complicated by tachycardia and hypotension.  Previous hemoglobin of 14.0.  - 1 unit of packed RBC ordered - Posttransfusion CBC - Continue to transfuse for hemoglobin less than 7 or hemodynamic instability  Hypotension In the setting of GI bleed and associated blood loss.  - IV fluids - Packed RBCs ordered as noted above - Hold home antihypertensives  Chronic pain syndrome - Continue home regimen of morphine 15 mg 3 times daily as needed, oxycodone 10 mg 4 times daily as needed, and gabapentin 600 mg 3 times daily.  PDMP  reviewed and appropriate  Lower extremity edema On examination, appears more consistent with lymphedema.  Nonpitting, with no history of heart failure.  Type 2 diabetes mellitus with hyperglycemia, without long-term current use of insulin (HCC) Well-controlled type 2 diabetes with last A1c of 6.6% approximately 8 months prior.  - A1c pending - SSI, very sensitive given high risk for hypoglycemia - Hold home regimen  Essential hypertension - Hold home antihypertensives in the setting of hypotension  Advance Care Planning:   Code Status: Full Code   Consults: GI  Family Communication: No family at bedside  Severity of Illness: The appropriate patient status for this patient is INPATIENT. Inpatient status is judged to be reasonable and necessary in order to provide the required intensity of service to ensure the patient's safety. The patient's presenting symptoms, physical exam findings, and initial radiographic and laboratory data in the context of their chronic comorbidities is felt to place them at high risk for further clinical deterioration. Furthermore, it is not anticipated that the patient will be medically stable for discharge from the hospital within 2 midnights of admission.   * I certify that at the point of admission it is my clinical judgment that the patient will require inpatient hospital care spanning beyond 2 midnights from the point of admission due to high intensity of service, high risk for further deterioration and high frequency of surveillance required.*  Author: Verdene Lennert, MD 01/16/2024 6:27 PM  For on call review [email or web address].

## 2024-01-17 ENCOUNTER — Inpatient Hospital Stay: Payer: Self-pay | Admitting: Certified Registered Nurse Anesthetist

## 2024-01-17 ENCOUNTER — Encounter
Admission: EM | Disposition: A | Discharge status: Home / Self Care | Payer: Self-pay | Source: Home / Self Care | Attending: Internal Medicine

## 2024-01-17 ENCOUNTER — Encounter: Payer: Self-pay | Admitting: Internal Medicine

## 2024-01-17 ENCOUNTER — Other Ambulatory Visit: Payer: Self-pay

## 2024-01-17 DIAGNOSIS — K922 Gastrointestinal hemorrhage, unspecified: Secondary | ICD-10-CM | POA: Diagnosis not present

## 2024-01-17 HISTORY — PX: HEMOSTASIS CONTROL: SHX6838

## 2024-01-17 HISTORY — PX: ESOPHAGOGASTRODUODENOSCOPY (EGD) WITH PROPOFOL: SHX5813

## 2024-01-17 HISTORY — PX: HEMOSTASIS CLIP PLACEMENT: SHX6857

## 2024-01-17 HISTORY — PX: SUBMUCOSAL INJECTION: SHX5543

## 2024-01-17 LAB — BASIC METABOLIC PANEL
Anion gap: 8 (ref 5–15)
BUN: 34 mg/dL — ABNORMAL HIGH (ref 8–23)
CO2: 25 mmol/L (ref 22–32)
Calcium: 8.3 mg/dL — ABNORMAL LOW (ref 8.9–10.3)
Chloride: 105 mmol/L (ref 98–111)
Creatinine, Ser: 0.63 mg/dL (ref 0.44–1.00)
GFR, Estimated: >60 mL/min (ref >60)
Glucose, Bld: 105 mg/dL — ABNORMAL HIGH (ref 70–99)
Potassium: 3.8 mmol/L (ref 3.5–5.1)
Sodium: 138 mmol/L (ref 135–145)

## 2024-01-17 LAB — TYPE AND SCREEN
ABO/RH(D): A POS
Antibody Screen: NEGATIVE
Unit division: 0

## 2024-01-17 LAB — CBC WITH DIFFERENTIAL/PLATELET
Abs Immature Granulocytes: 0.02 10*3/uL (ref 0.00–0.07)
Basophils Absolute: 0.1 10*3/uL (ref 0.0–0.1)
Basophils Relative: 1 %
Eosinophils Absolute: 0.1 10*3/uL (ref 0.0–0.5)
Eosinophils Relative: 2 %
HCT: 25.2 % — ABNORMAL LOW (ref 36.0–46.0)
Hemoglobin: 8.3 g/dL — ABNORMAL LOW (ref 12.0–15.0)
Immature Granulocytes: 0 %
Lymphocytes Relative: 27 %
Lymphs Abs: 1.9 10*3/uL (ref 0.7–4.0)
MCH: 29 pg (ref 26.0–34.0)
MCHC: 32.9 g/dL (ref 30.0–36.0)
MCV: 88.1 fL (ref 80.0–100.0)
Monocytes Absolute: 0.7 10*3/uL (ref 0.1–1.0)
Monocytes Relative: 10 %
Neutro Abs: 4.2 10*3/uL (ref 1.7–7.7)
Neutrophils Relative %: 60 %
Platelets: 234 10*3/uL (ref 150–400)
RBC: 2.86 MIL/uL — ABNORMAL LOW (ref 3.87–5.11)
RDW: 14.3 % (ref 11.5–15.5)
WBC: 7 10*3/uL (ref 4.0–10.5)
nRBC: 0 % (ref 0.0–0.2)

## 2024-01-17 LAB — BPAM RBC
Blood Product Expiration Date: 202503082359
ISSUE DATE / TIME: 202502012052
Unit Type and Rh: 6200

## 2024-01-17 LAB — CBG MONITORING, ED
Glucose-Capillary: 103 mg/dL — ABNORMAL HIGH (ref 70–99)
Glucose-Capillary: 99 mg/dL (ref 70–99)
Glucose-Capillary: 136 mg/dL — ABNORMAL HIGH (ref 70–99)
Glucose-Capillary: 85 mg/dL (ref 70–99)

## 2024-01-17 LAB — HEMOGLOBIN A1C
Hgb A1c MFr Bld: 6.6 % — ABNORMAL HIGH (ref 4.8–5.6)
Mean Plasma Glucose: 142.72 mg/dL

## 2024-01-17 SURGERY — ESOPHAGOGASTRODUODENOSCOPY (EGD) WITH PROPOFOL
Anesthesia: General

## 2024-01-17 MED ORDER — PROPOFOL 10 MG/ML IV BOLUS
INTRAVENOUS | Status: DC | PRN
  Administered 2024-01-17 (×2): 50 mg via INTRAVENOUS
  Administered 2024-01-17: 30 mg via INTRAVENOUS
  Administered 2024-01-17: 80 mg via INTRAVENOUS
  Administered 2024-01-17: 30 mg via INTRAVENOUS

## 2024-01-17 MED ORDER — EPINEPHRINE 1 MG/10ML IJ SOSY
PREFILLED_SYRINGE | INTRAMUSCULAR | Status: DC | PRN
  Administered 2024-01-17: 2 mg via INTRAVENOUS

## 2024-01-17 MED ORDER — PROPOFOL 1000 MG/100ML IV EMUL
INTRAVENOUS | Status: AC
  Filled 2024-01-17: qty 100

## 2024-01-17 MED ORDER — SODIUM CHLORIDE 0.9 % IV SOLN: INTRAVENOUS | Status: DC

## 2024-01-17 MED ORDER — LACTATED RINGERS IV SOLN: INTRAVENOUS | Status: AC

## 2024-01-17 NOTE — ED Notes (Signed)
Report given to Manufacturing engineer

## 2024-01-17 NOTE — Anesthesia Postprocedure Evaluation (Signed)
Anesthesia Post Note  Patient: Erica Ruiz [facility]  Procedure(s) Performed: ESOPHAGOGASTRODUODENOSCOPY (EGD) WITH PROPOFOL SUBMUCOSAL INJECTION HEMOSTASIS CONTROL HEMOSTASIS CLIP PLACEMENT  Patient location during evaluation: Endoscopy Anesthesia Type: General Level of consciousness: awake and alert Pain management: pain level controlled Vital Signs Assessment: post-procedure vital signs reviewed and stable Respiratory status: spontaneous breathing, nonlabored ventilation, respiratory function stable and patient connected to nasal cannula oxygen Cardiovascular status: blood pressure returned to baseline and stable Postop Assessment: no apparent nausea or vomiting Anesthetic complications: no   No notable events documented.   Last Vitals:  Vitals:   01/17/24 1257 01/17/24 1306  BP: 103/84 124/73  Pulse: 96 97  Resp: 18   Temp:    SpO2: 95% 97%    Last Pain:  Vitals:   01/17/24 1306  TempSrc:   PainSc: 0-No pain                 Louie Boston

## 2024-01-17 NOTE — Transfer of Care (Signed)
Immediate Anesthesia Transfer of Care Note  Patient: Tawnia Schirm [facility]  Procedure(s) Performed: ESOPHAGOGASTRODUODENOSCOPY (EGD) WITH PROPOFOL SUBMUCOSAL INJECTION HEMOSTASIS CONTROL HEMOSTASIS CLIP PLACEMENT  Patient Location: PACU and Endoscopy Unit  Anesthesia Type:General  Level of Consciousness: awake, drowsy, and patient cooperative  Airway & Oxygen Therapy: Patient Spontanous Breathing  Post-op Assessment: Report given to RN and Post -op Vital signs reviewed and stable  Post vital signs: Reviewed and stable  Last Vitals:  Vitals Value Taken Time  BP    Temp    Pulse    Resp    SpO2      Last Pain:  Vitals:   01/17/24 1157  TempSrc: Temporal  PainSc: 0-No pain         Complications: No notable events documented.

## 2024-01-17 NOTE — Anesthesia Preprocedure Evaluation (Addendum)
Anesthesia Evaluation  Patient identified by MRN, date of birth, ID band Patient awake    Reviewed: Allergy & Precautions, NPO status , Patient's Chart, lab work & pertinent test results  History of Anesthesia Complications Negative for: history of anesthetic complications  Airway Mallampati: III  TM Distance: >3 FB Neck ROM: full    Dental no notable dental hx.    Pulmonary Current Smoker and Patient abstained from smoking.   Pulmonary exam normal        Cardiovascular hypertension, On Medications Normal cardiovascular exam     Neuro/Psych negative neurological ROS  negative psych ROS   GI/Hepatic negative GI ROS, Neg liver ROS,,,  Endo/Other  diabetes, Type 2    Renal/GU negative Renal ROS  negative genitourinary   Musculoskeletal  (+)  Fibromyalgia -, narcotic dependent  Abdominal   Peds  Hematology  (+) Blood dyscrasia, anemia   Anesthesia Other Findings Past Medical History: No date: Back pain No date: Diabetes mellitus without complication (HCC) No date: Hypertension  Past Surgical History: No date: BREAST BIOPSY; Right     Comment:  neg bx/clip No date: CHOLECYSTECTOMY No date: COLON SURGERY 08/23/2018: COLONOSCOPY WITH PROPOFOL; N/A     Comment:  Procedure: COLONOSCOPY WITH PROPOFOL with biopsies;                Surgeon: Midge Minium, MD;  Location: [facility];  Service: Endoscopy;  Laterality: N/A;  DIABETIC 10/13/2023: COLONOSCOPY WITH PROPOFOL; N/A     Comment:  Procedure: COLONOSCOPY WITH BIOPSY;  Surgeon: Midge Minium, MD;  Location: [facility];  Service:               Endoscopy;  Laterality: N/A;  Diabetic No date: COLOSTOMY REVERSAL No date: KNEE SURGERY 08/23/2018: POLYPECTOMY; N/A     Comment:  Procedure: POLYPECTOMY;  Surgeon: Midge Minium, MD;                Location: [facility];  Service: Endoscopy;                Laterality:  N/A; 10/13/2023: POLYPECTOMY     Comment:  Procedure: POLYPECTOMY;  Surgeon: Midge Minium, MD;                Location: [facility];  Service: Endoscopy;; No date: TONSILLECTOMY No date: TUBAL LIGATION     Reproductive/Obstetrics negative OB ROS                             Anesthesia Physical Anesthesia Plan  ASA: 3  Anesthesia Plan: General   Post-op Pain Management: Minimal or no pain anticipated   Induction: Intravenous  PONV Risk Score and Plan: 2 and Propofol infusion and TIVA  Airway Management Planned: Natural Airway and Nasal Cannula  Additional Equipment:   Intra-op Plan:   Post-operative Plan:   Informed Consent: I have reviewed the patients History and Physical, chart, labs and discussed the procedure including the risks, benefits and alternatives for the proposed anesthesia with the patient or authorized representative who has indicated his/her understanding and acceptance.     Dental Advisory Given  Plan Discussed with: Anesthesiologist, CRNA and Surgeon  Anesthesia Plan Comments: (Patient consented for risks of anesthesia including but not limited to:  - adverse reactions to medications -  risk of airway placement if required - damage to eyes, teeth, lips or other oral mucosa - nerve damage due to positioning  - sore throat or hoarseness - Damage to heart, brain, nerves, lungs, other parts of body or loss of life  Patient voiced understanding and assent.)        Anesthesia Quick Evaluation

## 2024-01-17 NOTE — Progress Notes (Signed)
Progress Note   Patient: Erica Ruiz WUJ:[id number] DOB: 04/28/1961 DOA: 01/16/2024     1 DOS: the patient was seen and examined on 01/17/2024   Brief hospital course: 63yo with h/o DM, chronic pain syndrome, HTN, and HLD who presented with on 2/1 with hematemesis.  Has been taking Goody powders.  GI consulted, EGD today.  Hgb has dropped but no indication for transfusion at this time.  Assessment and Plan:  Acute upper GI bleed Likely related to recent use of Goody powders   No prior history of GI bleed GI consulted, EGD today with one non-bleeding cratered duodenal ulcer with adherent clot treated with injection, hemostatic spray, and hemostatic clip She is recommended to have IV Protonix BID x 3 days If recurrent bleeding, she needs STAT CTA and IR consultation    Symptomatic anemia Patient presenting with hemoglobin of 9.2 complicated by tachycardia and hypotension; previous hemoglobin of 14.0 1 unit of packed RBC transfused CBC post-transfusion is 8.3 Continue to transfuse for hemoglobin less than 7 or hemodynamic instability   Hypotension with known h/o HTN In the setting of GI bleed and associated blood loss Resolved after IV fluid and PRBC Continue to hold home antihypertensives (hydrochlorothiazide, lisinopril)   Chronic pain syndrome Continue home regimen of MS Contin 15 mg 3 times daily as needed, oxycodone 10 mg 4 times daily as needed, and gabapentin 600 mg 3 times daily PDMP reviewed on admission and appropriate   Lower extremity edema On examination, appears more consistent with lymphedema Nonpitting, with no history of heart failure   Type 2 diabetes mellitus with hyperglycemia, without long-term current use of insulin  A1c 6.6, good control SSI, very sensitive given high risk for hypoglycemia Hold Synjardy  HLD Continue atorvastatin  Mood d/o Continue duloxetine  Obesity Weight loss should be encouraged Outpatient PCP/bariatric medicine f/u  encouraged      Consultants: GI  Procedures: EGD 2/2  Antibiotics: None   Subjective: No further emesis since presentation, persistent mild epigastric pain.  No light-headed/dizzy with ambulation.  Physical Exam: Vitals:   01/17/24 0430 01/17/24 0530 01/17/24 0600 01/17/24 0742  BP: 112/70 110/75 106/66 129/76  Pulse: (!) 108 (!) 111 (!) 107 (!) 105  Resp: 15 20 15 12   Temp:    98.4 F (36.9 C)  TempSrc:    Oral  SpO2: 93% 92% 93% 98%      Objective: Vitals:   01/17/24 0600 01/17/24 0742  BP: 106/66 129/76  Pulse: (!) 107 (!) 105  Resp: 15 12  Temp:  98.4 F (36.9 C)  SpO2: 93% 98%    Exam:  General:  Appears calm and comfortable and is in NAD Eyes:  EOMI, normal lids, iris ENT:  grossly normal hearing, lips & tongue, mmm Neck:  no LAD, masses or thyromegaly Cardiovascular:  RR with mild tachycardia. No LE edema.  Respiratory:   CTA bilaterally with no wheezes/rales/rhonchi.  Normal respiratory effort. Abdomen:  soft, +epigastric TTP, ND Skin:  no rash or induration seen on limited exam Musculoskeletal:  grossly normal tone BUE/BLE, good ROM, no bony abnormality Psychiatric:  bluntedmood and affect, speech fluent and appropriate, AOx3 Neurologic:  CN 2-12 grossly intact, moves all extremities in coordinated fashion  Data Reviewed: I have reviewed the patient's lab results since admission.  Pertinent labs for today include:   Glucose 105 BUN 34, down from 54 on 2/1 Hgb 8.3, 9.2 on 2/1    Family Communication: None present  Disposition: Status is: Inpatient Remains  inpatient appropriate because: IV PPI   Planned Discharge Destination: Home    Time spent: 50 minutes  Author: Jonah Blue, MD 01/17/2024 8:42 AM  For on call review [email or web address].

## 2024-01-17 NOTE — Op Note (Signed)
[facility] Gastroenterology Patient Name: Erica Ruiz Procedure Date: 01/17/2024 11:10 AM MRN: [id number] Account #: [id number] Date of Birth: [birth year] Admit Type: Inpatient Age: 63 Room: [facility] ENDO ROOM 4 Gender: Female Note Status: Finalized Instrument Name: Upper Endoscope [id number] Procedure:             Upper GI endoscopy Indications:           Coffee-ground emesis, Melena Providers:             Eather Colas MD, MD Referring MD:          Duanne Limerick, MD (Referring MD) Medicines:             Monitored Anesthesia Care Complications:         No immediate complications. Estimated blood loss:                         Minimal. Procedure:             Pre-Anesthesia Assessment:                        - Prior to the procedure, a History and Physical was                         performed, and patient medications and allergies were                         reviewed. The patient is competent. The risks and                         benefits of the procedure and the sedation options and                         risks were discussed with the patient. All questions                         were answered and informed consent was obtained.                         Patient identification and proposed procedure were                         verified by the physician, the nurse, the                         anesthesiologist, the anesthetist and the technician                         in the endoscopy suite. Mental Status Examination:                         alert and oriented. Airway Examination: normal                         oropharyngeal airway and neck mobility. Respiratory                         Examination: clear to auscultation. CV Examination:  normal. Prophylactic Antibiotics: The patient does not                         require prophylactic antibiotics. Prior                         Anticoagulants: The patient has taken no anticoagulant                          or antiplatelet agents. ASA Grade Assessment: III - A                         patient with severe systemic disease. After reviewing                         the risks and benefits, the patient was deemed in                         satisfactory condition to undergo the procedure. The                         anesthesia plan was to use monitored anesthesia care                         (MAC). Immediately prior to administration of                         medications, the patient was re-assessed for adequacy                         to receive sedatives. The heart rate, respiratory                         rate, oxygen saturations, blood pressure, adequacy of                         pulmonary ventilation, and response to care were                         monitored throughout the procedure. The physical                         status of the patient was re-assessed after the                         procedure.                        After obtaining informed consent, the endoscope was                         passed under direct vision. Throughout the procedure,                         the patient's blood pressure, pulse, and oxygen                         saturations were monitored continuously. The Endoscope  was introduced through the mouth, and advanced to the                         second part of duodenum. The upper GI endoscopy was                         somewhat difficult due to excessive bleeding.                         Successful completion of the procedure was aided by                         controlling the bleeding. The patient tolerated the                         procedure well. Findings:      The examined esophagus was normal.      Red blood was found in the gastric body.      One non-bleeding cratered duodenal ulcer with an adherent clot (Forrest       Class IIb) was found in the duodenal bulb. The lesion was 15 mm in       largest dimension.  Area was successfully injected with 2 mL of a 0.1       mg/mL solution of epinephrine for drug delivery. Estimated blood loss       was minimal.      The adherent clot was then washed away. There appeared to be no visible       vessel underneath the clot. The ulcer did start to ooze blood however.       Purastat was initally applied but it was hard to tell if there was       adequate hemostasis. For hemostasis, hemostatic spray was deployed. A       single spray was applied. There did not appear to be any more bleeding.       For location marking, one hemostatic clip was successfully placed. Impression:            - Normal esophagus.                        - Red blood in the gastric body.                        - Non-bleeding duodenal ulcer with an adherent clot                         (Forrest Class IIb). Injected with epinephrine.                         Purastat applied and then hemospray applied. Clip                         placed for marking.                        - No specimens collected. Recommendation:        - Return patient to hospital ward for ongoing care.                        - Clear  liquid diet.                        - Use Protonix (pantoprazole) 40 mg IV BID for 3 days.                        - If significant recurrent bleeding, would recommend                         stat CTA and VIR consultation. Procedure Code(s):     --- Professional ---                        [phone], Esophagogastroduodenoscopy, flexible,                         transoral; with control of bleeding, any method                        43236, 59, Esophagogastroduodenoscopy, flexible,                         transoral; with directed submucosal injection(s), any                         substance                        44799, 59, Unlisted procedure, small intestine Diagnosis Code(s):     --- Professional ---                        K92.2, Gastrointestinal hemorrhage, unspecified                         K26.4, Chronic or unspecified duodenal ulcer with                         hemorrhage                        K92.0, Hematemesis                        K92.1, Melena (includes Hematochezia) CPT copyright 2022 American Medical Association. All rights reserved. The codes documented in this report are preliminary and upon coder review may  be revised to meet current compliance requirements. Eather Colas MD, MD 01/17/2024 12:56:16 PM Number of Addenda: 0 Note Initiated On: 01/17/2024 11:10 AM Estimated Blood Loss:  Estimated blood loss was minimal.      [facility]

## 2024-01-17 NOTE — ED Notes (Signed)
Report given to [employer] in ENDO

## 2024-01-17 NOTE — Anesthesia Procedure Notes (Signed)
Procedure Name: MAC Date/Time: 01/17/2024 12:17 PM  Performed by: Elmarie Mainland, CRNAPre-anesthesia Checklist: Patient identified, Emergency Drugs available, Suction available and Patient being monitored Patient Re-evaluated:Patient Re-evaluated prior to induction Oxygen Delivery Method: Nasal cannula

## 2024-01-18 ENCOUNTER — Encounter: Payer: Self-pay | Admitting: Gastroenterology

## 2024-01-18 DIAGNOSIS — K269 Duodenal ulcer, unspecified as acute or chronic, without hemorrhage or perforation: Secondary | ICD-10-CM | POA: Diagnosis not present

## 2024-01-18 DIAGNOSIS — K922 Gastrointestinal hemorrhage, unspecified: Secondary | ICD-10-CM | POA: Diagnosis not present

## 2024-01-18 LAB — CBC WITH DIFFERENTIAL/PLATELET
Abs Immature Granulocytes: 0.03 10*3/uL (ref 0.00–0.07)
Basophils Absolute: 0 10*3/uL (ref 0.0–0.1)
Basophils Relative: 1 %
Eosinophils Absolute: 0.1 10*3/uL (ref 0.0–0.5)
Eosinophils Relative: 2 %
HCT: 22.4 % — ABNORMAL LOW (ref 36.0–46.0)
Hemoglobin: 7.4 g/dL — ABNORMAL LOW (ref 12.0–15.0)
Immature Granulocytes: 1 %
Lymphocytes Relative: 31 %
Lymphs Abs: 1.8 10*3/uL (ref 0.7–4.0)
MCH: 29.4 pg (ref 26.0–34.0)
MCHC: 33 g/dL (ref 30.0–36.0)
MCV: 88.9 fL (ref 80.0–100.0)
Monocytes Absolute: 0.5 10*3/uL (ref 0.1–1.0)
Monocytes Relative: 8 %
Neutro Abs: 3.3 10*3/uL (ref 1.7–7.7)
Neutrophils Relative %: 57 %
Platelets: 199 10*3/uL (ref 150–400)
RBC: 2.52 MIL/uL — ABNORMAL LOW (ref 3.87–5.11)
RDW: 14.5 % (ref 11.5–15.5)
WBC: 5.8 10*3/uL (ref 4.0–10.5)
nRBC: 0 % (ref 0.0–0.2)

## 2024-01-18 LAB — CBG MONITORING, ED
Glucose-Capillary: 93 mg/dL (ref 70–99)
Glucose-Capillary: 98 mg/dL (ref 70–99)
Glucose-Capillary: 89 mg/dL (ref 70–99)

## 2024-01-18 LAB — BASIC METABOLIC PANEL
Anion gap: 9 (ref 5–15)
BUN: 13 mg/dL (ref 8–23)
CO2: 25 mmol/L (ref 22–32)
Calcium: 8.2 mg/dL — ABNORMAL LOW (ref 8.9–10.3)
Chloride: 104 mmol/L (ref 98–111)
Creatinine, Ser: 0.51 mg/dL (ref 0.44–1.00)
GFR, Estimated: >60 mL/min (ref >60)
Glucose, Bld: 119 mg/dL — ABNORMAL HIGH (ref 70–99)
Potassium: 3.4 mmol/L — ABNORMAL LOW (ref 3.5–5.1)
Sodium: 138 mmol/L (ref 135–145)

## 2024-01-18 LAB — GLUCOSE, CAPILLARY: Glucose-Capillary: 106 mg/dL — ABNORMAL HIGH (ref 70–99)

## 2024-01-18 NOTE — Progress Notes (Signed)
    Midge Minium, MD [facility]   [address], [address] [place], [place] [address] Phone: [phone] Fax : [phone]   Subjective: The patient had an upper endoscopy yesterday by Dr. Mia Creek and was found to have a nonbleeding duodenal ulcer with adherent clot that was injected with epinephrine with Hemospray applied and a clip placed for marking.  The patient was started on a PPI for her GI bleed.  The patient states that she had only 1 black stool yesterday and has had no further bleeding.  The patient's hemoglobin this morning has decreased from 8.3 yesterday to 7.4 today.  She denies any abdominal pain and states that she is feeling better today than she was yesterday.  She is asking if she can go home.   Objective: Vital signs in last 24 hours: Vitals:   01/17/24 2228 01/18/24 0237 01/18/24 0658 01/18/24 0948  BP:   120/79 124/72  Pulse:   99 89  Resp:  16 15 16   Temp: 98.5 F (36.9 C) 98.5 F (36.9 C) 98.5 F (36.9 C)   TempSrc: Oral Oral Oral   SpO2:   97% 99%   Weight change:   Intake/Output Summary (Last 24 hours) at 01/18/2024 1005 Last data filed at 01/17/2024 1817 Gross per 24 hour  Intake 240 ml  Output --  Net 240 ml     Exam: General: Patient sitting up in bed in no apparent distress.   Lab Results: @LABTEST2 @ Micro Results: No results found for this or any previous visit (from the past 240 hours). Studies/Results: No results found. Medications: I have reviewed the patient's current medications. Scheduled Meds:  atorvastatin  20 mg Oral Daily   DULoxetine  30 mg Oral Daily   gabapentin  600 mg Oral BID   insulin aspart  0-6 Units Subcutaneous TID WC   pantoprazole (PROTONIX) IV  40 mg Intravenous Q12H   sodium chloride flush  3 mL Intravenous Q12H   Continuous Infusions:  sodium chloride Stopped (01/16/24 1749)   PRN Meds:.acetaminophen **OR** acetaminophen, morphine, ondansetron **OR** ondansetron (ZOFRAN) IV,  oxyCODONE   Assessment: Principal Problem:   Acute upper GI bleed Active Problems:   Essential hypertension   Type 2 diabetes mellitus with hyperglycemia, without long-term current use of insulin (HCC)   Symptomatic anemia   Hypotension   Lower extremity edema   Chronic pain syndrome    Plan: This patient underwent an EGD with multiple modalities used to prevent any further bleeding.  Despite this the patient's hemoglobin has dropped by about 1 g overnight.  This could be due to equilibration although further bleeding may be an issue.  The patient had a clip placed at the bleeding site so that if further intervention is needed by interventional radiology or vascular surgery that the vessel area could be delineated with the clip.  Repeating any endoscopic procedures to stop any further bleeding is unlikely to be successful since multiple modalities were attempted yesterday and if the bleeding should continue then interventional radiology versus vascular surgery should be contacted for embolization.  Nothing further to do from GI point of view.  I will sign off.  Please call if any further GI concerns or questions.  We would like to thank you for the opportunity to participate in the care of [facility].    LOS: 2 days   Midge Minium, MD.FACG 01/18/2024, 10:05 AM Pager [phone] 7am-5pm  Check AMION for 5pm -7am coverage and on weekends

## 2024-01-18 NOTE — Plan of Care (Signed)

## 2024-01-18 NOTE — Progress Notes (Signed)
Progress Note   Patient: Erica Ruiz RUE:[id number] DOB: 05-11-1961 DOA: 01/16/2024     2 DOS: the patient was seen and examined on 01/18/2024   Brief hospital course: 63yo with h/o DM, chronic pain syndrome, HTN, and HLD who presented with on 2/1 with hematemesis. Has been taking Goody powders. GI consulted, EGD 2/2 with large bleeding ulcer s/p injection and clip. Transfused 1 unit PRBC.  Needs IV PPI BID x 3 days.  Assessment and Plan:  Acute upper GI bleed Likely related to recent use of Goody powders   No prior history of GI bleed GI consulted, EGD today with one non-bleeding cratered duodenal ulcer with adherent clot treated with injection, hemostatic spray, and hemostatic clip She is recommended to have IV Protonix BID x 3 days If recurrent bleeding, she needs STAT CTA and IR consultation  If she is stable tomorrow, will likely dc to home   Symptomatic anemia Patient presenting with hemoglobin of 9.2 complicated by tachycardia and hypotension; previous hemoglobin of 14.0 1 unit of packed RBC transfused CBC post-transfusion was 8.3, now 7.4 Continue to transfuse for hemoglobin less than 7 or hemodynamic instability   Hypotension with known h/o HTN In the setting of GI bleed and associated blood loss Resolved after IV fluid and PRBC Continue to hold home antihypertensives (hydrochlorothiazide, lisinopril)   Chronic pain syndrome Continue home regimen of MS Contin 15 mg 3 times daily as needed, oxycodone 10 mg 4 times daily as needed, and gabapentin 600 mg 3 times daily PDMP reviewed on admission and appropriate   Lower extremity edema On examination, appears more consistent with lymphedema Nonpitting, with no history of heart failure   Type 2 diabetes mellitus with hyperglycemia, without long-term current use of insulin  A1c 6.6, good control SSI, very sensitive given high risk for hypoglycemia Hold Synjardy   HLD Continue atorvastatin   Mood d/o Continue  duloxetine   Obesity Weight loss should be encouraged Outpatient PCP/bariatric medicine f/u encouraged          Consultants: GI   Procedures: EGD 2/2   Antibiotics: None    Subjective: Feeling better.  No further n/v, no abdominal pain.  Would like to go home.  Physical Exam: Vitals:   01/17/24 2228 01/18/24 0237 01/18/24 0658 01/18/24 0948  BP:   120/79 124/72  Pulse:   99 89  Resp:  16 15 16   Temp: 98.5 F (36.9 C) 98.5 F (36.9 C) 98.5 F (36.9 C)   TempSrc: Oral Oral Oral   SpO2:   97% 99%      Intake/Output Summary (Last 24 hours) at 01/18/2024 1530 Last data filed at 01/17/2024 1817 Gross per 24 hour  Intake 240 ml  Output --  Net 240 ml   There were no vitals filed for this visit.  Exam:  General:  Appears calm and comfortable and is in NAD Eyes:  EOMI, normal lids, iris ENT:  grossly normal hearing, lips & tongue, mmm Neck:  no LAD, masses or thyromegaly Cardiovascular:  RR with mild tachycardia. No LE edema.  Respiratory:   CTA bilaterally with no wheezes/rales/rhonchi.  Normal respiratory effort. Abdomen:  soft, +epigastric TTP, ND Skin:  no rash or induration seen on limited exam Musculoskeletal:  grossly normal tone BUE/BLE, good ROM, no bony abnormality Psychiatric:  bluntedmood and affect, speech fluent and appropriate, AOx3 Neurologic:  CN 2-12 grossly intact, moves all extremities in coordinated fashion  Data Reviewed: I have reviewed the patient's lab results since admission.  Pertinent labs for today include:   K+ 3.4 Glucose 119 Hgb 7.4, down from 8.3     Family Communication: None present  Disposition: Status is: Inpatient Remains inpatient appropriate because: ongoing management  Planned Discharge Destination: Home    Time spent: 50 minutes  Author: Jonah Blue, MD 01/18/2024 3:30 PM  For on call review [email or web address].

## 2024-01-18 NOTE — ED Notes (Signed)
Patient being transported up to inpatient room at this time by transport.

## 2024-01-19 DIAGNOSIS — K922 Gastrointestinal hemorrhage, unspecified: Secondary | ICD-10-CM | POA: Diagnosis not present

## 2024-01-19 LAB — CBC WITH DIFFERENTIAL/PLATELET
Abs Immature Granulocytes: 0.03 10*3/uL (ref 0.00–0.07)
Basophils Absolute: 0 10*3/uL (ref 0.0–0.1)
Basophils Relative: 1 %
Eosinophils Absolute: 0.2 10*3/uL (ref 0.0–0.5)
Eosinophils Relative: 3 %
HCT: 23.6 % — ABNORMAL LOW (ref 36.0–46.0)
Hemoglobin: 7.7 g/dL — ABNORMAL LOW (ref 12.0–15.0)
Immature Granulocytes: 1 %
Lymphocytes Relative: 32 %
Lymphs Abs: 1.9 10*3/uL (ref 0.7–4.0)
MCH: 28.6 pg (ref 26.0–34.0)
MCHC: 32.6 g/dL (ref 30.0–36.0)
MCV: 87.7 fL (ref 80.0–100.0)
Monocytes Absolute: 0.6 10*3/uL (ref 0.1–1.0)
Monocytes Relative: 9 %
Neutro Abs: 3.2 10*3/uL (ref 1.7–7.7)
Neutrophils Relative %: 54 %
Platelets: 210 10*3/uL (ref 150–400)
RBC: 2.69 MIL/uL — ABNORMAL LOW (ref 3.87–5.11)
RDW: 14.4 % (ref 11.5–15.5)
WBC: 5.9 10*3/uL (ref 4.0–10.5)
nRBC: 0 % (ref 0.0–0.2)

## 2024-01-19 LAB — HIV ANTIBODY (ROUTINE TESTING W REFLEX): HIV Screen 4th Generation wRfx: NONREACTIVE

## 2024-01-19 LAB — GLUCOSE, CAPILLARY
Glucose-Capillary: 97 mg/dL (ref 70–99)
Glucose-Capillary: 122 mg/dL — ABNORMAL HIGH (ref 70–99)

## 2024-01-19 MED ORDER — PANTOPRAZOLE SODIUM 40 MG PO TBEC: 40.0000 mg | DELAYED_RELEASE_TABLET | Freq: Two times a day (BID) | ORAL | 1 refills | Status: AC

## 2024-01-19 MED ORDER — NICOTINE 21 MG/24HR TD PT24: 21.0000 mg | MEDICATED_PATCH | TRANSDERMAL | 1 refills | Status: DC

## 2024-01-19 NOTE — Hospital Course (Signed)
63yo with h/o DM, chronic pain syndrome, HTN, and HLD who presented with on 2/1 with hematemesis. Has been taking Goody powders. GI consulted, EGD 2/2 with large bleeding ulcer s/p injection and clip. Transfused 1 unit PRBC.  Needs IV PPI BID x 3 days.

## 2024-01-19 NOTE — Discharge Summary (Signed)
 Physician Discharge Summary   Patient: Erica Ruiz MRN: [id number] DOB: August 24, 1961  Admit date:     01/16/2024  Discharge date: 01/19/24  Discharge Physician: Delon Herald   PCP: Joshua Cathryne BROCKS, MD   Recommendations at discharge:   Continue Protonix  40 mg twice daily for 8 weeks and then once daily until instructed otherwise Follow up with GI; call for an appointment Follow up with Dr. Joshua in 1-2 weeks for hospital follow up and recheck CBC] Stop smoking; nicotine  patch is ordered Slowly advance diet to full liquids, soft diet, and then regular Return to the ER ASAP should recurrent GI bleeding occur  Discharge Diagnoses: Principal Problem:   Acute upper GI bleed Active Problems:   Symptomatic anemia   Hypotension   Essential hypertension   Type 2 diabetes mellitus with hyperglycemia, without long-term current use of insulin  (HCC)   Lower extremity edema   Chronic pain syndrome    Hospital Course: 63yo with h/o DM, chronic pain syndrome, HTN, and HLD who presented with on 2/1 with hematemesis. Has been taking Goody powders. GI consulted, EGD 2/2 with large bleeding ulcer s/p injection and clip. Transfused 1 unit PRBC.  Needs IV PPI BID x 3 days.   Assessment and Plan:  Acute upper GI bleed Likely related to recent use of Goody powders   No prior history of GI bleed GI consulted, EGD today with one non-bleeding cratered duodenal ulcer with adherent clot treated with injection, hemostatic spray, and hemostatic clip She is recommended to have IV Protonix  BID x 3 days If recurrent bleeding, she needs STAT CTA and IR consultation  Remains stable, will dc to home Continue Protonix  40 mg BID x 8 weeks If taking any NSAIDs, she will need Protonix  daily indefinitely   Symptomatic anemia Patient presenting with hemoglobin of 9.2 complicated by tachycardia and hypotension; previous hemoglobin of 14.0 1 unit of packed RBC transfused CBC post-transfusion was 8.3, 7.4,  7.7 - appears to be stable at this time   Hypotension with known h/o HTN In the setting of GI bleed and associated blood loss Resolved after IV fluid and PRBC Resume home antihypertensives (hydrochlorothiazide , lisinopril ) on 2/5   Chronic pain syndrome Continue home regimen of MS Contin  15 mg 3 times daily as needed, oxycodone  10 mg 4 times daily as needed, and gabapentin  600 mg 3 times daily PDMP reviewed on admission and appropriate   Lower extremity edema On examination, appears more consistent with lymphedema Nonpitting, with no history of heart failure   Type 2 diabetes mellitus with hyperglycemia, without long-term current use of insulin   A1c 6.6, good control Resume Synjardy   HLD Continue atorvastatin    Mood d/o Continue duloxetine    Obesity Weight loss should be encouraged Outpatient PCP/bariatric medicine f/u encouraged          Consultants: GI   Procedures: EGD 2/2   Antibiotics: None      Pain control -   Controlled Substance Reporting System database was reviewed. and patient was instructed, not to drive, operate heavy machinery, perform activities at heights, swimming or participation in water  activities or provide baby-sitting services while on Pain, Sleep and Anxiety Medications; until their outpatient Physician has advised to do so again. Also recommended to not to take more than prescribed Pain, Sleep and Anxiety Medications.    Disposition: Home Diet recommendation:  Full liquid -> soft diet -> regular diet, slowly advancing as tolerated DISCHARGE MEDICATION: Allergies as of 01/19/2024   No Known Allergies  Medication List     TAKE these medications    atorvastatin  20 MG tablet Commonly known as: LIPITOR Take 20 mg by mouth daily.   DULoxetine  30 MG capsule Commonly known as: CYMBALTA  Take 30 mg by mouth daily.   fluticasone  50 MCG/ACT nasal spray Commonly known as: FLONASE  Place 2 sprays into both nostrils  daily. What changed:  when to take this reasons to take this   gabapentin  600 MG tablet Commonly known as: NEURONTIN  Take 600 mg by mouth 2 (two) times daily.   glucose blood test strip 1 each by Other route 2 (two) times daily.   hydrochlorothiazide  25 MG tablet Commonly known as: HYDRODIURIL  Take 1 tablet (25 mg total) by mouth daily.   lisinopril  10 MG tablet Commonly known as: ZESTRIL  Take 1 tablet (10 mg total) by mouth daily.   morphine  15 MG 12 hr tablet Commonly known as: MS CONTIN  Take 15 mg by mouth every 8 (eight) hours as needed for pain.   Narcan  4 MG/0.1ML Liqd nasal spray kit Generic drug: naloxone  Place 1 spray into the nose once.   nicotine  21 mg/24hr patch Commonly known as: NICODERM CQ  - dosed in mg/24 hours Place 1 patch (21 mg total) onto the skin daily.   ondansetron  8 MG disintegrating tablet Commonly known as: ZOFRAN -ODT Take 1 tablet (8 mg total) by mouth every 8 (eight) hours as needed for nausea or vomiting.   onetouch ultrasoft lancets 1 each by Other route 2 (two) times daily.   Oxycodone  HCl 10 MG Tabs Take 10 mg by mouth 4 (four) times daily as needed. Pain clinic   pantoprazole  40 MG tablet Commonly known as: Protonix  Take 1 tablet (40 mg total) by mouth 2 (two) times daily.   PROBIOTIC DAILY PO Take 1 tablet by mouth daily.   Synjardy XR 12.04-999 MG Tb24 Generic drug: Empagliflozin -metFORMIN  HCl ER Take 2 tablets by mouth daily. [place]   tiZANidine  4 MG tablet Commonly known as: ZANAFLEX  Take 4 mg by mouth every 8 (eight) hours as needed for muscle spasms.        Discharge Exam:   Subjective: Feeling good - no pain, no n/v, small dark BM this AM intermixed with brown stool.  Really wants to go home.   Objective: Vitals:   01/19/24 0429 01/19/24 0731  BP: 106/68 121/68  Pulse: 86 84  Resp: 16 16  Temp: 98.9 F (37.2 C) 99.2 F (37.3 C)  SpO2: 95% 94%   No intake or output data in the 24 hours ending  01/19/24 1232 There were no vitals filed for this visit.  Exam:  General:  Appears calm and comfortable and is in NAD Eyes:  EOMI, normal lids, iris ENT:  grossly normal hearing, lips & tongue, mmm Neck:  no LAD, masses or thyromegaly Cardiovascular:  RR with mild tachycardia. No LE edema.  Respiratory:   CTA bilaterally with no wheezes/rales/rhonchi.  Normal respiratory effort. Abdomen:  soft, NT, ND Skin:  no rash or induration seen on limited exam Musculoskeletal:  grossly normal tone BUE/BLE, good ROM, no bony abnormality Psychiatric:  bluntedmood and affect, speech fluent and appropriate, AOx3 Neurologic:  CN 2-12 grossly intact, moves all extremities in coordinated fashion  Data Reviewed: I have reviewed the patient's lab results since admission.  Pertinent labs for today include:  WBC 5.9 Hgb 7.7, stable     Condition at discharge: improving  The results of significant diagnostics from this hospitalization (including imaging, microbiology, ancillary and laboratory) are  listed below for reference.   Imaging Studies: MM 3D SCREENING MAMMOGRAM BILATERAL BREAST Result Date: 01/07/2024 CLINICAL DATA:  Screening. EXAM: DIGITAL SCREENING BILATERAL MAMMOGRAM WITH TOMOSYNTHESIS AND CAD TECHNIQUE: Bilateral screening digital craniocaudal and mediolateral oblique mammograms were obtained. Bilateral screening digital breast tomosynthesis was performed. The images were evaluated with computer-aided detection. COMPARISON:  Previous exam(s). ACR Breast Density Category b: There are scattered areas of fibroglandular density. FINDINGS: There are no findings suspicious for malignancy. IMPRESSION: No mammographic evidence of malignancy. A result letter of this screening mammogram will be mailed directly to the patient. RECOMMENDATION: Screening mammogram in one year. (Code:SM-B-01Y) BI-RADS CATEGORY  1: Negative. Electronically Signed   By: Toribio Agreste M.D.   On: 01/07/2024 15:01     Microbiology: No results found for this or any previous visit.  Labs: CBC: Recent Labs  Lab 01/16/24 1423 01/17/24 0447 01/18/24 0952 01/19/24 0652  WBC 9.2 7.0 5.8 5.9  NEUTROABS  --  4.2 3.3 3.2  HGB 9.2* 8.3* 7.4* 7.7*  HCT 28.7* 25.2* 22.4* 23.6*  MCV 88.0 88.1 88.9 87.7  PLT 294 234 199 210   Basic Metabolic Panel: Recent Labs  Lab 01/16/24 1557 01/17/24 0447 01/18/24 0952  NA 135 138 138  K 4.1 3.8 3.4*  CL 99 105 104  CO2 26 25 25   GLUCOSE 123* 105* 119*  BUN 54* 34* 13  CREATININE 0.81 0.63 0.51  CALCIUM  8.6* 8.3* 8.2*   Liver Function Tests: Recent Labs  Lab 01/16/24 1557  AST 14*  ALT 12  ALKPHOS 31*  BILITOT 0.4  PROT 5.7*  ALBUMIN 2.8*   CBG: Recent Labs  Lab 01/18/24 1135 01/18/24 1629 01/18/24 2048 01/19/24 0734 01/19/24 1127  GLUCAP 98 89 106* 97 122*    Discharge time spent: greater than 30 minutes.  Signed: Delon Herald, MD [facility] 01/19/2024

## 2024-01-19 NOTE — TOC CM/SW Note (Signed)
 Transition of Care [facility]) - Inpatient Brief Assessment   Patient Details  Name: Erica Ruiz MRN: [id number] Date of Birth: Aug 11, 1961  Transition of Care [facility]) CM/SW Contact:    Corean ONEIDA Haddock, RN Phone Number: 01/19/2024, 12:35 PM   Clinical Narrative:   Transition of Care [facility]) Screening Note   Patient Details  Name: Erica Ruiz Date of Birth: Aug 31, 1961   Transition of Care [facility]) CM/SW Contact:    Corean ONEIDA Haddock, RN Phone Number: 01/19/2024, 12:35 PM    Transition of Care Department [facility]) has reviewed patient and no TOC needs have been identified at this time. If new patient transition needs arise, please place a TOC consult.    Transition of Care Asessment: Insurance and Status: Insurance coverage has been reviewed Patient has primary care physician: Yes     Prior/Current Home Services: No current home services Social Drivers of Health Review: SDOH reviewed no interventions necessary Readmission risk has been reviewed: Yes Transition of care needs: no transition of care needs at this time

## 2024-01-19 NOTE — Plan of Care (Signed)

## 2024-01-20 ENCOUNTER — Other Ambulatory Visit: Payer: Self-pay

## 2024-01-20 ENCOUNTER — Observation Stay
Admission: EM | Admit: 2024-01-20 | Discharge: 2024-01-22 | Disposition: A | Discharge status: Home / Self Care | Payer: PPO | Attending: Internal Medicine | Admitting: Internal Medicine

## 2024-01-20 ENCOUNTER — Emergency Department: Payer: PPO

## 2024-01-20 ENCOUNTER — Telehealth: Payer: Self-pay

## 2024-01-20 DIAGNOSIS — K920 Hematemesis: Secondary | ICD-10-CM | POA: Diagnosis not present

## 2024-01-20 DIAGNOSIS — G894 Chronic pain syndrome: Secondary | ICD-10-CM | POA: Insufficient documentation

## 2024-01-20 DIAGNOSIS — K264 Chronic or unspecified duodenal ulcer with hemorrhage: Secondary | ICD-10-CM | POA: Diagnosis not present

## 2024-01-20 DIAGNOSIS — Z79899 Other long term (current) drug therapy: Secondary | ICD-10-CM | POA: Diagnosis not present

## 2024-01-20 DIAGNOSIS — D649 Anemia, unspecified: Secondary | ICD-10-CM | POA: Insufficient documentation

## 2024-01-20 DIAGNOSIS — K922 Gastrointestinal hemorrhage, unspecified: Secondary | ICD-10-CM | POA: Diagnosis not present

## 2024-01-20 DIAGNOSIS — K625 Hemorrhage of anus and rectum: Secondary | ICD-10-CM | POA: Diagnosis present

## 2024-01-20 DIAGNOSIS — F17210 Nicotine dependence, cigarettes, uncomplicated: Secondary | ICD-10-CM | POA: Insufficient documentation

## 2024-01-20 DIAGNOSIS — I723 Aneurysm of iliac artery: Secondary | ICD-10-CM | POA: Diagnosis not present

## 2024-01-20 DIAGNOSIS — K269 Duodenal ulcer, unspecified as acute or chronic, without hemorrhage or perforation: Secondary | ICD-10-CM

## 2024-01-20 DIAGNOSIS — E119 Type 2 diabetes mellitus without complications: Secondary | ICD-10-CM | POA: Insufficient documentation

## 2024-01-20 DIAGNOSIS — R000 Tachycardia, unspecified: Secondary | ICD-10-CM | POA: Diagnosis not present

## 2024-01-20 DIAGNOSIS — M51369 Other intervertebral disc degeneration, lumbar region without mention of lumbar back pain or lower extremity pain: Secondary | ICD-10-CM | POA: Diagnosis not present

## 2024-01-20 DIAGNOSIS — I10 Essential (primary) hypertension: Secondary | ICD-10-CM | POA: Diagnosis not present

## 2024-01-20 DIAGNOSIS — K429 Umbilical hernia without obstruction or gangrene: Secondary | ICD-10-CM | POA: Diagnosis not present

## 2024-01-20 DIAGNOSIS — E1165 Type 2 diabetes mellitus with hyperglycemia: Secondary | ICD-10-CM

## 2024-01-20 LAB — CBC WITH DIFFERENTIAL/PLATELET
Abs Immature Granulocytes: 0.06 10*3/uL (ref 0.00–0.07)
Basophils Absolute: 0 10*3/uL (ref 0.0–0.1)
Basophils Relative: 0 %
Eosinophils Absolute: 0.1 10*3/uL (ref 0.0–0.5)
Eosinophils Relative: 1 %
HCT: 23.3 % — ABNORMAL LOW (ref 36.0–46.0)
Hemoglobin: 7.5 g/dL — ABNORMAL LOW (ref 12.0–15.0)
Immature Granulocytes: 1 %
Lymphocytes Relative: 17 %
Lymphs Abs: 1.5 10*3/uL (ref 0.7–4.0)
MCH: 28.7 pg (ref 26.0–34.0)
MCHC: 32.2 g/dL (ref 30.0–36.0)
MCV: 89.3 fL (ref 80.0–100.0)
Monocytes Absolute: 0.6 10*3/uL (ref 0.1–1.0)
Monocytes Relative: 7 %
Neutro Abs: 6.8 10*3/uL (ref 1.7–7.7)
Neutrophils Relative %: 74 %
Platelets: 282 10*3/uL (ref 150–400)
RBC: 2.61 MIL/uL — ABNORMAL LOW (ref 3.87–5.11)
RDW: 14.8 % (ref 11.5–15.5)
WBC: 9.1 10*3/uL (ref 4.0–10.5)
nRBC: 0 % (ref 0.0–0.2)

## 2024-01-20 LAB — LIPASE, BLOOD: Lipase: 40 U/L (ref 11–51)

## 2024-01-20 LAB — COMPREHENSIVE METABOLIC PANEL
ALT: 12 U/L (ref 0–44)
AST: 15 U/L (ref 15–41)
Albumin: 3.3 g/dL — ABNORMAL LOW (ref 3.5–5.0)
Alkaline Phosphatase: 34 U/L — ABNORMAL LOW (ref 38–126)
Anion gap: 12 (ref 5–15)
BUN: 12 mg/dL (ref 8–23)
CO2: 25 mmol/L (ref 22–32)
Calcium: 8.4 mg/dL — ABNORMAL LOW (ref 8.9–10.3)
Chloride: 101 mmol/L (ref 98–111)
Creatinine, Ser: 0.8 mg/dL (ref 0.44–1.00)
GFR, Estimated: >60 mL/min (ref >60)
Glucose, Bld: 118 mg/dL — ABNORMAL HIGH (ref 70–99)
Potassium: 3.3 mmol/L — ABNORMAL LOW (ref 3.5–5.1)
Sodium: 138 mmol/L (ref 135–145)
Total Bilirubin: 0.5 mg/dL (ref 0.0–1.2)
Total Protein: 6 g/dL — ABNORMAL LOW (ref 6.5–8.1)

## 2024-01-20 LAB — TROPONIN I (HIGH SENSITIVITY): Troponin I (High Sensitivity): 10 ng/L (ref <18)

## 2024-01-20 MED ORDER — SODIUM CHLORIDE 0.9 % IV SOLN: Freq: Once | INTRAVENOUS | Status: DC

## 2024-01-20 MED ORDER — IOHEXOL 350 MG/ML SOLN
100.0000 mL | Freq: Once | INTRAVENOUS | Status: AC | PRN
  Administered 2024-01-20: 100 mL via INTRAVENOUS

## 2024-01-20 MED ORDER — PANTOPRAZOLE SODIUM 40 MG IV SOLR
80.0000 mg | Freq: Once | INTRAVENOUS | Status: AC
  Administered 2024-01-20: 80 mg via INTRAVENOUS
  Filled 2024-01-20: qty 20

## 2024-01-20 MED ORDER — PANTOPRAZOLE SODIUM 40 MG IV SOLR: 40.0000 mg | Freq: Once | INTRAVENOUS | Status: DC

## 2024-01-20 MED ORDER — METOCLOPRAMIDE HCL 5 MG/ML IJ SOLN
10.0000 mg | Freq: Once | INTRAMUSCULAR | Status: AC
  Administered 2024-01-20: 10 mg via INTRAVENOUS
  Filled 2024-01-20: qty 2

## 2024-01-20 MED ORDER — LACTATED RINGERS IV BOLUS
1000.0000 mL | Freq: Once | INTRAVENOUS | Status: AC
  Administered 2024-01-20: 1000 mL via INTRAVENOUS

## 2024-01-20 NOTE — Transitions of Care (Post Inpatient/ED Visit) (Signed)
   01/20/2024  Name: Jonessa Triplett MRN: [id number] DOB: 21-Oct-1961  Today's TOC FU Call Status: Today's TOC FU Call Status:: Unsuccessful Call (1st Attempt) Unsuccessful Call (1st Attempt) Date: 01/20/24  Attempted to reach the patient regarding the most recent Inpatient/ED visit. Patient was called in an Outreach attempt to offer [facility]BCI  30-day TOC program. Pt is eligible for program due to potential risk for readmission and/or high utilization. Unfortunately, I was not able to speak with the patient in regards to recent hospital discharge   Left a HIPAA compliant phone message message for patient including VBCI CM contact information with request for a call back in regard to recent hospital discharge     Follow Up Plan: Additional outreach attempts will be made to reach the patient to complete the Transitions of Care (Post Inpatient/ED visit) call.   Bari Mayans , BSN, RN [facility]   VBCI-Population Health RN Care Manager Direct Dial [phone]  Fax: [phone] Website: [email or web address]

## 2024-01-20 NOTE — ED Triage Notes (Signed)
 Pt reports she began having hematemesis today, pt was just admitted to the hospital for same a few ds ago and d/c yesterday. Pt states she has a bleeding ulcer in her stomach. Pt does not take blood thinners. Pt denies pain in abd.

## 2024-01-20 NOTE — ED Provider Notes (Signed)
 [facility] Provider Note    Event Date/Time   First MD Initiated Contact with Patient 01/20/24 2144     (approximate)   History   Chief Complaint Hematemesis   HPI  Erica Ruiz is a 63 y.o. female with past medical history of hypertension, hyperlipidemia, and peptic ulcer disease who presents to the ED complaining of hematemesis.  Patient reports that she was just in the hospital for an upper GI bleed due to an ulcer in her stomach.  She had an endoscopy with hemostatic injection and clipping of the ulcer at that time, states she was doing well at the time of discharge yesterday.  Just prior to arrival, she had an episode of large volume hematemesis, has felt slightly lightheaded and dizzy since then.  She has not had any bowel movements today, has not noticed any blood in her stool or dark tarry stool recently.  She does not take any blood thinners and denies any abdominal pain.     Physical Exam   Triage Vital Signs: ED Triage Vitals  Encounter Vitals Group     BP 01/20/24 2142 (!) 86/52     Systolic BP Percentile --      Diastolic BP Percentile --      Pulse Rate 01/20/24 2142 (!) 118     Resp 01/20/24 2142 18     Temp 01/20/24 2142 98.1 F (36.7 C)     Temp Source 01/20/24 2142 Oral     SpO2 01/20/24 2142 100 %     Weight 01/20/24 2141 170 lb (77.1 kg)     Height 01/20/24 2141 5' 1 (1.549 m)     Head Circumference --      Peak Flow --      Pain Score 01/20/24 2141 0     Pain Loc --      Pain Education --      Exclude from Growth Chart --     Most recent vital signs: Vitals:   01/20/24 2258 01/20/24 2352  BP: 98/61 (!) 94/58  Pulse: 100 97  Resp: (!) 22 (!) 21  Temp: 98.4 F (36.9 C) 98.3 F (36.8 C)  SpO2: 100% 100%    Constitutional: Alert and oriented. Eyes: Conjunctivae are normal. Head: Atraumatic. Nose: No congestion/rhinnorhea. Mouth/Throat: Mucous membranes are moist.  Cardiovascular: Normal rate, regular  rhythm. Grossly normal heart sounds.  2+ radial pulses bilaterally. Respiratory: Normal respiratory effort.  No retractions. Lungs CTAB. Gastrointestinal: Soft and nontender. No distention. Musculoskeletal: No lower extremity tenderness nor edema.  Neurologic:  Normal speech and language. No gross focal neurologic deficits are appreciated.    ED Results / Procedures / Treatments   Labs (all labs ordered are listed, but only abnormal results are displayed) Labs Reviewed  CBC WITH DIFFERENTIAL/PLATELET - Abnormal; Notable for the following components:      Result Value   RBC 2.61 (*)    Hemoglobin 7.5 (*)    HCT 23.3 (*)    All other components within normal limits  COMPREHENSIVE METABOLIC PANEL - Abnormal; Notable for the following components:   Potassium 3.3 (*)    Glucose, Bld 118 (*)    Calcium  8.4 (*)    Total Protein 6.0 (*)    Albumin 3.3 (*)    Alkaline Phosphatase 34 (*)    All other components within normal limits  LIPASE, BLOOD  TYPE AND SCREEN  PREPARE RBC (CROSSMATCH)  PREPARE RBC (CROSSMATCH)  TROPONIN I (HIGH SENSITIVITY)  TROPONIN I (HIGH SENSITIVITY)     EKG  ED ECG REPORT I, Carlin Palin, the attending physician, personally viewed and interpreted this ECG.   Date: 01/20/2024  EKG Time: 22:36  Rate: 90  Rhythm: normal sinus rhythm  Axis: Normal  Intervals:none  ST&T Change: None  PROCEDURES:  Critical Care performed: Yes, see critical care procedure note(s)  .Critical Care  Performed by: Palin Carlin, MD Authorized by: Palin Carlin, MD   Critical care provider statement:    Critical care time (minutes):  30   Critical care time was exclusive of:  Separately billable procedures and treating other patients and teaching time   Critical care was necessary to treat or prevent imminent or life-threatening deterioration of the following conditions:  Shock (Upper GI bleed)   Critical care was time spent personally by me on the following  activities:  Development of treatment plan with patient or surrogate, discussions with consultants, evaluation of patient's response to treatment, examination of patient, ordering and review of laboratory studies, ordering and review of radiographic studies, ordering and performing treatments and interventions, pulse oximetry, re-evaluation of patient's condition and review of old charts   I assumed direction of critical care for this patient from another provider in my specialty: no     Care discussed with: admitting provider      MEDICATIONS ORDERED IN ED: Medications  0.9 %  sodium chloride  infusion (has no administration in time range)  lactated ringers  bolus 1,000 mL (1,000 mLs Intravenous New Bag/Given 01/20/24 2255)  pantoprazole  (PROTONIX ) injection 80 mg (80 mg Intravenous Given 01/20/24 2246)  metoCLOPramide  (REGLAN ) injection 10 mg (10 mg Intravenous Given 01/20/24 2245)  iohexol  (OMNIPAQUE ) 350 MG/ML injection 100 mL (100 mLs Intravenous Contrast Given 01/20/24 2352)     IMPRESSION / MDM / ASSESSMENT AND PLAN / ED COURSE  I reviewed the triage vital signs and the nursing notes.                              63 y.o. female with past medical history of hypertension, hyperlipidemia, and peptic ulcer disease who presents to the ED complaining of episode of large volume of hematemesis just prior to arrival following recent admission for bleeding peptic ulcer.  Patient's presentation is most consistent with acute presentation with potential threat to life or bodily function.  Differential diagnosis includes, but is not limited to, upper GI bleed, anemia, hemorrhagic shock, AKI, electrolyte abnormality.  Patient ill-appearing but in no acute distress, initial vital signs show tachycardia and hypotension.  IV fluid resuscitation was initiated, we will also transfuse 1 unit of emergency release blood given concern for recurrent upper GI bleed.  Case discussed with Dr. Onita of GI, who states  that given patient already treated endoscopically, next step would be to perform embolization.  We will further assess with CT angiogram, patient also crossmatched for 2 additional units of PRBCs if needed.  Labs with hemoglobin stable compared to yesterday and no significant leukocytosis, no electrolyte abnormality or AKI noted, LFTs are unremarkable.  Troponin within normal limits.  Patient turned over to oncoming provider pending CT angio results and reassessment.      FINAL CLINICAL IMPRESSION(S) / ED DIAGNOSES   Final diagnoses:  Upper GI bleed     Rx / DC Orders   ED Discharge Orders     None        Note:  This document was prepared using Dragon voice  recognition software and may include unintentional dictation errors.   Willo Dunnings, MD 01/20/24 [phone]

## 2024-01-20 NOTE — ED Notes (Addendum)
 This RN remains at bedside. Pt has no complaints and/or new symptoms after blood administration initiated. Dtr at bedside.

## 2024-01-20 NOTE — ED Notes (Signed)
This RN transported pt to CT. 

## 2024-01-21 ENCOUNTER — Telehealth: Payer: Self-pay

## 2024-01-21 ENCOUNTER — Encounter
Admission: EM | Disposition: A | Discharge status: Home / Self Care | Payer: Self-pay | Source: Home / Self Care | Attending: Emergency Medicine

## 2024-01-21 ENCOUNTER — Observation Stay: Payer: PPO | Admitting: Anesthesiology

## 2024-01-21 DIAGNOSIS — K264 Chronic or unspecified duodenal ulcer with hemorrhage: Secondary | ICD-10-CM

## 2024-01-21 DIAGNOSIS — K920 Hematemesis: Secondary | ICD-10-CM

## 2024-01-21 DIAGNOSIS — K269 Duodenal ulcer, unspecified as acute or chronic, without hemorrhage or perforation: Secondary | ICD-10-CM

## 2024-01-21 DIAGNOSIS — K922 Gastrointestinal hemorrhage, unspecified: Secondary | ICD-10-CM | POA: Diagnosis present

## 2024-01-21 DIAGNOSIS — I10 Essential (primary) hypertension: Secondary | ICD-10-CM | POA: Diagnosis not present

## 2024-01-21 DIAGNOSIS — F17210 Nicotine dependence, cigarettes, uncomplicated: Secondary | ICD-10-CM | POA: Diagnosis not present

## 2024-01-21 HISTORY — PX: ESOPHAGOGASTRODUODENOSCOPY (EGD) WITH PROPOFOL: SHX5813

## 2024-01-21 HISTORY — PX: HOT HEMOSTASIS: SHX5433

## 2024-01-21 LAB — BASIC METABOLIC PANEL
Anion gap: 8 (ref 5–15)
BUN: 16 mg/dL (ref 8–23)
CO2: 27 mmol/L (ref 22–32)
Calcium: 7.9 mg/dL — ABNORMAL LOW (ref 8.9–10.3)
Chloride: 105 mmol/L (ref 98–111)
Creatinine, Ser: 0.74 mg/dL (ref 0.44–1.00)
GFR, Estimated: >60 mL/min (ref >60)
Glucose, Bld: 86 mg/dL (ref 70–99)
Potassium: 3.5 mmol/L (ref 3.5–5.1)
Sodium: 140 mmol/L (ref 135–145)

## 2024-01-21 LAB — CBG MONITORING, ED
Glucose-Capillary: 46 mg/dL — ABNORMAL LOW (ref 70–99)
Glucose-Capillary: 98 mg/dL (ref 70–99)

## 2024-01-21 LAB — HEMOGLOBIN AND HEMATOCRIT, BLOOD
HCT: 24.5 % — ABNORMAL LOW (ref 36.0–46.0)
HCT: 25.8 % — ABNORMAL LOW (ref 36.0–46.0)
Hemoglobin: 8.4 g/dL — ABNORMAL LOW (ref 12.0–15.0)
Hemoglobin: 8.6 g/dL — ABNORMAL LOW (ref 12.0–15.0)

## 2024-01-21 LAB — CBC
HCT: 26.8 % — ABNORMAL LOW (ref 36.0–46.0)
Hemoglobin: 8.9 g/dL — ABNORMAL LOW (ref 12.0–15.0)
MCH: 28.9 pg (ref 26.0–34.0)
MCHC: 33.2 g/dL (ref 30.0–36.0)
MCV: 87 fL (ref 80.0–100.0)
Platelets: 217 10*3/uL (ref 150–400)
RBC: 3.08 MIL/uL — ABNORMAL LOW (ref 3.87–5.11)
RDW: 14.9 % (ref 11.5–15.5)
WBC: 6.8 10*3/uL (ref 4.0–10.5)
nRBC: 0 % (ref 0.0–0.2)

## 2024-01-21 LAB — PROTIME-INR
INR: 1.1 (ref 0.8–1.2)
Prothrombin Time: 14 s (ref 11.4–15.2)

## 2024-01-21 LAB — PREPARE RBC (CROSSMATCH)

## 2024-01-21 SURGERY — ESOPHAGOGASTRODUODENOSCOPY (EGD) WITH PROPOFOL
Anesthesia: General

## 2024-01-21 MED ORDER — ATORVASTATIN CALCIUM 20 MG PO TABS
20.0000 mg | ORAL_TABLET | Freq: Every day | ORAL | Status: DC
  Administered 2024-01-21 – 2024-01-22 (×2): 20 mg via ORAL
  Filled 2024-01-21 (×2): qty 1

## 2024-01-21 MED ORDER — ACETAMINOPHEN 650 MG RE SUPP: 650.0000 mg | Freq: Four times a day (QID) | RECTAL | Status: DC | PRN

## 2024-01-21 MED ORDER — HYDROCHLOROTHIAZIDE 25 MG PO TABS
25.0000 mg | ORAL_TABLET | Freq: Every day | ORAL | Status: DC
Start: 2024-01-21 — End: 2024-01-21

## 2024-01-21 MED ORDER — SODIUM CHLORIDE 0.9 % IV SOLN: 10.0000 mL/h | Freq: Once | INTRAVENOUS | Status: DC

## 2024-01-21 MED ORDER — LACTATED RINGERS IV SOLN: INTRAVENOUS | Status: DC | PRN

## 2024-01-21 MED ORDER — SODIUM CHLORIDE 0.9 % IV SOLN: INTRAVENOUS | Status: DC

## 2024-01-21 MED ORDER — LIDOCAINE HCL (PF) 2 % IJ SOLN
INTRAMUSCULAR | Status: AC
  Filled 2024-01-21: qty 5

## 2024-01-21 MED ORDER — OXYCODONE HCL 5 MG PO TABS
10.0000 mg | ORAL_TABLET | Freq: Four times a day (QID) | ORAL | Status: DC | PRN
  Administered 2024-01-21 – 2024-01-22 (×3): 10 mg via ORAL
  Filled 2024-01-21 (×3): qty 2

## 2024-01-21 MED ORDER — LISINOPRIL 10 MG PO TABS: 10.0000 mg | ORAL_TABLET | Freq: Every day | ORAL | Status: DC

## 2024-01-21 MED ORDER — ONDANSETRON HCL 4 MG/2ML IJ SOLN: 4.0000 mg | Freq: Four times a day (QID) | INTRAMUSCULAR | Status: DC | PRN

## 2024-01-21 MED ORDER — PROPOFOL 10 MG/ML IV BOLUS
INTRAVENOUS | Status: AC
  Filled 2024-01-21: qty 40

## 2024-01-21 MED ORDER — TIZANIDINE HCL 4 MG PO TABS: 4.0000 mg | ORAL_TABLET | Freq: Three times a day (TID) | ORAL | Status: DC | PRN

## 2024-01-21 MED ORDER — GABAPENTIN 300 MG PO CAPS
600.0000 mg | ORAL_CAPSULE | Freq: Two times a day (BID) | ORAL | Status: DC
Start: 2024-01-21 — End: 2024-01-22
  Administered 2024-01-21 – 2024-01-22 (×3): 600 mg via ORAL
  Filled 2024-01-21 (×3): qty 2

## 2024-01-21 MED ORDER — ONDANSETRON HCL 4 MG PO TABS: 4.0000 mg | ORAL_TABLET | Freq: Four times a day (QID) | ORAL | Status: DC | PRN

## 2024-01-21 MED ORDER — DEXTROSE 50 % IV SOLN
INTRAVENOUS | Status: AC
  Filled 2024-01-21: qty 50

## 2024-01-21 MED ORDER — ACETAMINOPHEN 325 MG PO TABS
650.0000 mg | ORAL_TABLET | Freq: Four times a day (QID) | ORAL | Status: DC | PRN
  Administered 2024-01-21: 650 mg via ORAL
  Filled 2024-01-21: qty 2

## 2024-01-21 MED ORDER — EPHEDRINE SULFATE-NACL 50-0.9 MG/10ML-% IV SOSY
PREFILLED_SYRINGE | INTRAVENOUS | Status: DC | PRN
  Administered 2024-01-21: 10 mg via INTRAVENOUS

## 2024-01-21 MED ORDER — MAGNESIUM HYDROXIDE 400 MG/5ML PO SUSP: 30.0000 mL | Freq: Every day | ORAL | Status: DC | PRN

## 2024-01-21 MED ORDER — FENTANYL CITRATE (PF) 100 MCG/2ML IJ SOLN
INTRAMUSCULAR | Status: DC | PRN
  Administered 2024-01-21: 50 ug via INTRAVENOUS

## 2024-01-21 MED ORDER — PANTOPRAZOLE SODIUM 40 MG IV SOLR
40.0000 mg | Freq: Two times a day (BID) | INTRAVENOUS | Status: DC
  Administered 2024-01-21 – 2024-01-22 (×3): 40 mg via INTRAVENOUS
  Filled 2024-01-21 (×3): qty 10

## 2024-01-21 MED ORDER — NALOXONE HCL 4 MG/0.1ML NA LIQD: 1.0000 | Freq: Once | NASAL | Status: DC | PRN

## 2024-01-21 MED ORDER — MORPHINE SULFATE ER 15 MG PO TBCR
15.0000 mg | EXTENDED_RELEASE_TABLET | Freq: Three times a day (TID) | ORAL | Status: DC
  Administered 2024-01-21 – 2024-01-22 (×3): 15 mg via ORAL
  Filled 2024-01-21 (×3): qty 1

## 2024-01-21 MED ORDER — DULOXETINE HCL 30 MG PO CPEP
30.0000 mg | ORAL_CAPSULE | Freq: Every day | ORAL | Status: DC
  Administered 2024-01-21 – 2024-01-22 (×2): 30 mg via ORAL
  Filled 2024-01-21 (×2): qty 1

## 2024-01-21 MED ORDER — FENTANYL CITRATE (PF) 100 MCG/2ML IJ SOLN
INTRAMUSCULAR | Status: AC
  Filled 2024-01-21: qty 2

## 2024-01-21 MED ORDER — TRAZODONE HCL 50 MG PO TABS: 25.0000 mg | ORAL_TABLET | Freq: Every evening | ORAL | Status: DC | PRN

## 2024-01-21 MED ORDER — PROPOFOL 500 MG/50ML IV EMUL
INTRAVENOUS | Status: DC | PRN
  Administered 2024-01-21: 120 ug/kg/min via INTRAVENOUS

## 2024-01-21 MED ORDER — DEXMEDETOMIDINE HCL IN NACL 200 MCG/50ML IV SOLN
INTRAVENOUS | Status: DC | PRN
  Administered 2024-01-21: 8 ug via INTRAVENOUS

## 2024-01-21 MED ORDER — DEXTROSE 50 % IV SOLN
50.0000 mL | Freq: Once | INTRAVENOUS | Status: AC
  Administered 2024-01-21: 50 mL via INTRAVENOUS

## 2024-01-21 MED ORDER — HYDRALAZINE HCL 20 MG/ML IJ SOLN: 5.0000 mg | Freq: Four times a day (QID) | INTRAMUSCULAR | Status: DC | PRN

## 2024-01-21 MED ORDER — NICOTINE 21 MG/24HR TD PT24
21.0000 mg | MEDICATED_PATCH | Freq: Every day | TRANSDERMAL | Status: DC
  Administered 2024-01-22: 21 mg via TRANSDERMAL
  Filled 2024-01-21 (×2): qty 1

## 2024-01-21 MED ORDER — RISAQUAD PO CAPS
1.0000 | ORAL_CAPSULE | Freq: Every day | ORAL | Status: DC
  Administered 2024-01-21 – 2024-01-22 (×2): 1 via ORAL
  Filled 2024-01-21 (×2): qty 1

## 2024-01-21 MED ORDER — POTASSIUM CHLORIDE IN NACL 20-0.9 MEQ/L-% IV SOLN
INTRAVENOUS | Status: DC
  Filled 2024-01-21 (×3): qty 1000

## 2024-01-21 NOTE — ED Notes (Signed)
 Pt assisted to bedside toilet and there was moderate amount of large dark blood clots within BM.

## 2024-01-21 NOTE — Op Note (Signed)
 [facility] Patient Name: Erica Ruiz Procedure Date: 01/21/2024 12:36 PM MRN: [id number] Account #: [id number] Date of Birth: [birth year] Admit Type: Inpatient Age: 63 Room: [facility] ENDO ROOM 4 Gender: Female Note Status: Finalized Instrument Name: Upper Endoscope [id number] Procedure:             Upper GI endoscopy Indications:           Hematemesis Providers:             Rogelia Copping MD, MD Referring MD:          Cathryne KYM Molt, MD (Referring MD) Medicines:             Propofol  per Anesthesia Complications:         No immediate complications. Procedure:             Pre-Anesthesia Assessment:                        - Prior to the procedure, a History and Physical was                         performed, and patient medications and allergies were                         reviewed. The patient's tolerance of previous                         anesthesia was also reviewed. The risks and benefits                         of the procedure and the sedation options and risks                         were discussed with the patient. All questions were                         answered, and informed consent was obtained. Prior                         Anticoagulants: The patient has taken no anticoagulant                         or antiplatelet agents. ASA Grade Assessment: II - A                         patient with mild systemic disease. After reviewing                         the risks and benefits, the patient was deemed in                         satisfactory condition to undergo the procedure.                        After obtaining informed consent, the endoscope was                         passed under direct vision. Throughout the procedure,  the patient's blood pressure, pulse, and oxygen                         saturations were monitored continuously. The Endoscope                         was introduced through the mouth, and advanced  to the                         second part of duodenum. The upper GI endoscopy was                         accomplished without difficulty. The patient tolerated                         the procedure well. Findings:      The examined esophagus was normal.      The stomach was normal.      One oozing cratered duodenal ulcer with pigmented material was found in       the duodenal bulb. The lesion was 15 mm in largest dimension.       Coagulation for hemostasis using argon plasma at 2 liters/minute and 20       watts was successful. Impression:            - Normal esophagus.                        - Normal stomach.                        - Oozing duodenal ulcer with pigmented material.                         Treated with argon plasma coagulation (APC).                        - No specimens collected. Recommendation:        - Return patient to hospital ward for ongoing care.                        - Clear liquid diet.                        - Continue present medications.                        - The previously placed clip was seen adn if any                         further bleeding then the patient should be embolized                         by IR of vascular surgery. Procedure Code(s):     --- Professional ---                        [phone], Esophagogastroduodenoscopy, flexible,                         transoral; with control of bleeding, any method Diagnosis  Code(s):     --- Professional ---                        K92.0, Hematemesis                        K26.4, Chronic or unspecified duodenal ulcer with                         hemorrhage CPT copyright 2022 [employer]. All rights reserved. The codes documented in this report are preliminary and upon coder review may  be revised to meet current compliance requirements. Rogelia Copping MD, MD 01/21/2024 12:57:12 PM This report has been signed electronically. Number of Addenda: 0 Note Initiated On: 01/21/2024 12:36  PM Estimated Blood Loss:  Estimated blood loss: none.      [facility]

## 2024-01-21 NOTE — ED Provider Notes (Signed)
 12:42 AM  CT scan shows no active GI bleed.  Blood pressure is improving but still hypotensive.  Patient has not had any further episodes of bleeding.  Patient received 1 unit of emergent blood.  Given bleeding with hemoglobin of 7.5 on arrival, will give second unit of blood.  Will discuss with hospitalist for admission.   Consulted and discussed patient's case with hospitali, Dr. Lawence.  I have recommended admission and consulting physician agrees and will place admission orders.  Patient (and family if present) agree with this plan.   I reviewed all nursing notes, vitals, pertinent previous records.  All labs, EKGs, imaging ordered have been independently reviewed and interpreted by myself.    Kayde Warehime, Josette SAILOR, DO 01/21/24 [phone]

## 2024-01-21 NOTE — H&P (Signed)
 History and Physical    Erica Ruiz FMW:[id number] DOB: [birth year] DOA: 01/20/2024  PCP: Joshua Cathryne BROCKS, MD (Confirm with patient/family/NH records and if not entered, this has to be entered at [facility] point of entry) Patient coming from: Home  I have personally briefly reviewed patient's old medical records in [facility]  Chief Complaint: Rectal bleeding  HPI: Erica Ruiz is a 63 y.o. female with medical history significant of recent GI bleeding secondary to duodenal ulcer from NSAID's use, HTN, HLD, chronic pain syndrome, IIDM, presented with recurrent GI bleed after a EGD, injection hemostatic spray and hemostatic clips 2 days ago.  Patient first came to ED 5 days ago for lower GI bleed, EGD was done 3 days ago and during the procedure it was found patient had a duodenal ulcer covered with blood clot and started to oozing, subsequently the ulcer was treated with epinephrine  injection and hemostatic clip.  H&H stabilized and patient discharged home today before yesterday.  Yesterday evening, patient started to pass large amount of bright red blood per rectum x 1 then followed by another episode of rectal bleeding with mixed blood and blood clots.  Denies abdominal pain no nauseous vomiting no chest pain no shortness of breath no lightheadedness.  ED Course: Low-grade fever 100.2, blood pressure on the lower end, nonhypoxic.  Hemoglobin 8.9 compared to 7.72 days ago at discharge.  CTA was done in the emergency room which showed negative for active bleeding.  Patient was given 1 dose of 80 mg IV Protonix  in the ED.  GI Dr. Onita was informed.  Review of Systems: As per HPI otherwise 14 point review of systems negative.    Past Medical History:  Diagnosis Date   Back pain    Diabetes mellitus without complication (HCC)    Hypertension     Past Surgical History:  Procedure Laterality Date   BREAST BIOPSY Right    neg bx/clip   CHOLECYSTECTOMY     COLON SURGERY      COLONOSCOPY WITH PROPOFOL  N/A 08/23/2018   Procedure: COLONOSCOPY WITH PROPOFOL  with biopsies;  Surgeon: Jinny Carmine, MD;  Location: [facility];  Service: Endoscopy;  Laterality: N/A;  DIABETIC   COLONOSCOPY WITH PROPOFOL  N/A 10/13/2023   Procedure: COLONOSCOPY WITH BIOPSY;  Surgeon: Jinny Carmine, MD;  Location: [facility];  Service: Endoscopy;  Laterality: N/A;  Diabetic   COLOSTOMY REVERSAL     ESOPHAGOGASTRODUODENOSCOPY (EGD) WITH PROPOFOL  N/A 01/17/2024   Procedure: ESOPHAGOGASTRODUODENOSCOPY (EGD) WITH PROPOFOL ;  Surgeon: Maryruth Ole DASEN, MD;  Location: [facility]COPY;  Service: Endoscopy;  Laterality: N/A;   HEMOSTASIS CLIP PLACEMENT  01/17/2024   Procedure: HEMOSTASIS CLIP PLACEMENT;  Surgeon: Maryruth Ole DASEN, MD;  Location: [facility] ENDOSCOPY;  Service: Endoscopy;;   HEMOSTASIS CONTROL  01/17/2024   Procedure: HEMOSTASIS CONTROL;  Surgeon: Maryruth Ole DASEN, MD;  Location: [facility] ENDOSCOPY;  Service: Endoscopy;;   KNEE SURGERY     POLYPECTOMY N/A 08/23/2018   Procedure: POLYPECTOMY;  Surgeon: Jinny Carmine, MD;  Location: [facility];  Service: Endoscopy;  Laterality: N/A;   POLYPECTOMY  10/13/2023   Procedure: POLYPECTOMY;  Surgeon: Jinny Carmine, MD;  Location: [facility];  Service: Endoscopy;;   SUBMUCOSAL INJECTION  01/17/2024   Procedure: SUBMUCOSAL INJECTION;  Surgeon: Maryruth Ole DASEN, MD;  Location: [facility] ENDOSCOPY;  Service: Endoscopy;;   TONSILLECTOMY     TUBAL LIGATION       reports that she has been smoking cigarettes. She started smoking about 30  years ago. She has a 15 pack-year smoking history. She has never used smokeless tobacco. She reports that she does not drink alcohol and does not use drugs.  No Known Allergies  Family History  Problem Relation Age of Onset   Pulmonary embolism Mother    Cancer Father        prostate   Heart disease Father    Hypertension Father    Cancer Sister        non-hodgkin's lymphoma   Hepatitis C  Sister    Cancer Brother        colon   Diabetes Brother    Heart disease Brother    Hypertension Brother    Diabetes Daughter    Diabetes Brother    Hypertension Sister    Cancer Brother        prostate   Breast cancer Paternal Aunt      Prior to Admission medications   Medication Sig Start Date End Date Taking? Authorizing Provider  atorvastatin  (LIPITOR) 20 MG tablet Take 20 mg by mouth daily. 11/23/23   [provider]  DULoxetine  (CYMBALTA ) 30 MG capsule Take 30 mg by mouth daily. 03/18/18   [provider]  fluticasone  (FLONASE ) 50 MCG/ACT nasal spray Place 2 sprays into both nostrils daily. Patient taking differently: Place 2 sprays into both nostrils daily as needed for rhinitis or allergies. 06/17/20   Lavell Lye A, FNP  gabapentin  (NEURONTIN ) 600 MG tablet Take 600 mg by mouth 2 (two) times daily. 03/19/18   [provider]  glucose blood test strip 1 each by Other route 2 (two) times daily. 10/06/18   Joshua Cathryne BROCKS, MD  hydrochlorothiazide  (HYDRODIURIL ) 25 MG tablet Take 1 tablet (25 mg total) by mouth daily. 10/08/23   Joshua Cathryne BROCKS, MD  Lancets [facility] ULTRASOFT) lancets 1 each by Other route 2 (two) times daily. 10/06/18   Joshua Cathryne BROCKS, MD  lisinopril  (ZESTRIL ) 10 MG tablet Take 1 tablet (10 mg total) by mouth daily. 10/08/23   Joshua Cathryne BROCKS, MD  morphine  (MS CONTIN ) 15 MG 12 hr tablet Take 15 mg by mouth every 8 (eight) hours as needed for pain. 03/27/18   [provider]  NARCAN  4 MG/0.1ML LIQD nasal spray kit Place 1 spray into the nose once. 07/11/19   [provider]  nicotine  (NICODERM CQ  - DOSED IN MG/24 HOURS) 21 mg/24hr patch Place 1 patch (21 mg total) onto the skin daily. 01/19/24 01/18/25  Barbarann Nest, MD  ondansetron  (ZOFRAN -ODT) 8 MG disintegrating tablet Take 1 tablet (8 mg total) by mouth every 8 (eight) hours as needed for nausea or vomiting. 01/04/24   Bernardino Ditch, NP  Oxycodone  HCl 10 MG TABS Take 10 mg  by mouth 4 (four) times daily as needed. Pain clinic 11/05/20   [provider]  pantoprazole  (PROTONIX ) 40 MG tablet Take 1 tablet (40 mg total) by mouth 2 (two) times daily. 01/19/24 03/19/24  Barbarann Nest, MD  Probiotic Product (PROBIOTIC DAILY PO) Take 1 tablet by mouth daily.    [provider]  SYNJARDY XR 12.04-999 MG TB24 Take 2 tablets by mouth daily. Cherilyn 01/21/22   [provider]  tiZANidine  (ZANAFLEX ) 4 MG tablet Take 4 mg by mouth every 8 (eight) hours as needed for muscle spasms. 03/18/18   [provider]    Physical Exam: Vitals:   01/21/24 0715 01/21/24 0730 01/21/24 0800 01/21/24 0830  BP:  116/77 118/78 113/71  Pulse: 90 94 94 95  Resp: 19 19 15 14   Temp:      TempSrc:      SpO2: 98% 98% 98% 98%  Weight:      Height:        Constitutional: NAD, calm, comfortable Vitals:   01/21/24 0715 01/21/24 0730 01/21/24 0800 01/21/24 0830  BP:  116/77 118/78 113/71  Pulse: 90 94 94 95  Resp: 19 19 15 14   Temp:      TempSrc:      SpO2: 98% 98% 98% 98%  Weight:      Height:       Eyes: PERRL, lids and conjunctivae normal ENMT: Mucous membranes are moist. Posterior pharynx clear of any exudate or lesions.Normal dentition.  Neck: normal, supple, no masses, no thyromegaly Respiratory: clear to auscultation bilaterally, no wheezing, no crackles. Normal respiratory effort. No accessory muscle use.  Cardiovascular: Regular rate and rhythm, no murmurs / rubs / gallops. No extremity edema. 2+ pedal pulses. No carotid bruits.  Abdomen: no tenderness, no masses palpated. No hepatosplenomegaly. Bowel sounds positive.  Musculoskeletal: no clubbing / cyanosis. No joint deformity upper and lower extremities. Good ROM, no contractures. Normal muscle tone.  Skin: no rashes, lesions, ulcers. No induration Neurologic: CN 2-12 grossly intact. Sensation intact, DTR normal. Strength 5/5 in all 4.  Psychiatric: Normal judgment and insight. Alert and  oriented x 3. Normal mood.    Labs on Admission: I have personally reviewed following labs and imaging studies  CBC: Recent Labs  Lab 01/17/24 0447 01/18/24 0952 01/19/24 0652 01/20/24 2205 01/21/24 0537  WBC 7.0 5.8 5.9 9.1 6.8  NEUTROABS 4.2 3.3 3.2 6.8  --   HGB 8.3* 7.4* 7.7* 7.5* 8.9*  HCT 25.2* 22.4* 23.6* 23.3* 26.8*  MCV 88.1 88.9 87.7 89.3 87.0  PLT 234 199 210 282 217   Basic Metabolic Panel: Recent Labs  Lab 01/16/24 1557 01/17/24 0447 01/18/24 0952 01/20/24 2205 01/21/24 0537  NA 135 138 138 138 140  K 4.1 3.8 3.4* 3.3* 3.5  CL 99 105 104 101 105  CO2 26 25 25 25 27   GLUCOSE 123* 105* 119* 118* 86  BUN 54* 34* 13 12 16   CREATININE 0.81 0.63 0.51 0.80 0.74  CALCIUM  8.6* 8.3* 8.2* 8.4* 7.9*   GFR: Estimated Creatinine Clearance: 69.4 mL/min (by C-G formula based on SCr of 0.74 mg/dL). Liver Function Tests: Recent Labs  Lab 01/16/24 1557 01/20/24 2205  AST 14* 15  ALT 12 12  ALKPHOS 31* 34*  BILITOT 0.4 0.5  PROT 5.7* 6.0*  ALBUMIN 2.8* 3.3*   Recent Labs  Lab 01/16/24 1557 01/20/24 2205  LIPASE 29 40   No results for input(s): AMMONIA in the last 168 hours. Coagulation Profile: Recent Labs  Lab 01/16/24 1423 01/20/24 2216  INR 1.0 1.1   Cardiac Enzymes: No results for input(s): CKTOTAL, CKMB, CKMBINDEX, TROPONINI in the last 168 hours. BNP (last 3 results) No results for input(s): PROBNP in the last 8760 hours. HbA1C: No results for input(s): HGBA1C in the last 72 hours. CBG: Recent Labs  Lab 01/18/24 1135 01/18/24 1629 01/18/24 2048 01/19/24 0734 01/19/24 1127  GLUCAP 98 89 106* 97 122*   Lipid Profile: No results for input(s): CHOL, HDL, LDLCALC, TRIG, CHOLHDL, LDLDIRECT in the last 72 hours. Thyroid  Function Tests: No results for input(s): TSH, T4TOTAL, FREET4, T3FREE, THYROIDAB in the last 72 hours. Anemia Panel: No results for input(s): VITAMINB12, FOLATE, FERRITIN, TIBC,  IRON, RETICCTPCT in the last 72 hours. Urine analysis:  Component Value Date/Time   COLORURINE YELLOW 04/05/2018 1413   APPEARANCEUR HAZY (A) 04/05/2018 1413   LABSPEC 1.025 04/05/2018 1413   PHURINE 5.0 04/05/2018 1413   GLUCOSEU >1000 (A) 04/05/2018 1413   HGBUR TRACE (A) 04/05/2018 1413   BILIRUBINUR negative 10/10/2022 1510   KETONESUR TRACE (A) 04/05/2018 1413   PROTEINUR Negative 10/10/2022 1510   PROTEINUR NEGATIVE 04/05/2018 1413   UROBILINOGEN 0.2 10/10/2022 1510   NITRITE negative 10/10/2022 1510   NITRITE NEGATIVE 04/05/2018 1413   LEUKOCYTESUR Trace (A) 10/10/2022 1510    Radiological Exams on Admission: CT Angio Abd/Pel W and/or Wo Contrast Result Date: 01/21/2024 CLINICAL DATA:  Lower gastrointestinal hemorrhage EXAM: CT ANGIOGRAPHY ABDOMEN AND PELVIS WITH CONTRAST AND WITHOUT CONTRAST TECHNIQUE: Multidetector CT imaging of the abdomen and pelvis was performed using the standard protocol during bolus administration of intravenous contrast. Multiplanar reconstructed images and MIPs were obtained and reviewed to evaluate the vascular anatomy. RADIATION DOSE REDUCTION: This exam was performed according to the departmental dose-optimization program which includes automated exposure control, adjustment of the mA and/or kV according to patient size and/or use of iterative reconstruction technique. CONTRAST:  OMNIPAQUE  IOHEXOL  350 MG/ML SOLN COMPARISON:  None Available. FINDINGS: VASCULAR Aorta: Normal caliber aorta without aneurysm, dissection, vasculitis or significant stenosis. Celiac: Patent without evidence of aneurysm, dissection, vasculitis or significant stenosis. SMA: Patent without evidence of aneurysm, dissection, vasculitis or significant stenosis. Renals: Both renal arteries are patent without evidence of aneurysm, dissection, vasculitis, fibromuscular dysplasia or significant stenosis. IMA: Patent without evidence of aneurysm, dissection, vasculitis or  significant stenosis. 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 This aneurysm demonstrates a small wide surrounding fluid or inflammatory a tissue is suspicious for a mycotic aneurysm, less likely a posttraumatic aneurysm. Left lower extremity arterial inflow is unremarkable. Proximal Outflow: Unremarkable Veins: No obvious venous abnormality within the limitations of this arterial phase study. Review of the MIP images confirms the above findings. NON-VASCULAR Lower chest: No acute abnormality. Hepatobiliary: No focal liver abnormality is seen. Status post cholecystectomy. No biliary dilatation. Pancreas: Unremarkable Spleen: Unremarkable Adrenals/Urinary Tract: Adrenal glands are unremarkable. Kidneys are normal, without renal calculi, focal lesion, or hydronephrosis. Bladder is unremarkable. Stomach/Bowel: Endoscopic clip noted within the duodenal wall. No active gastrointestinal hemorrhage is identified. Anastomotic staple likely related to sigmoid colectomy noted. The stomach, small bowel, and large bowel are otherwise unremarkable. Appendix normal. No free intraperitoneal gas or fluid. Lymphatic: No pathologic adenopathy within the abdomen and pelvis. Reproductive: Uterus and bilateral adnexa are unremarkable. Other: Moderate fat containing umbilical hernia noted. 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 Superimposed advanced degenerative disc disease L4-5. Osseous structures are otherwise age-appropriate. No acute bone abnormality. IMPRESSION: 1. No active gastrointestinal hemorrhage identified. 2. 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 This is suspicious for a  mycotic aneurysm, less likely a posttraumatic aneurysm. Vascular surgery consultation is recommended. 3. Moderate fat containing umbilical hernia. 4. 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 Superimposed advanced degenerative disc disease L4-5. Electronically Signed   By: Dorethia Molt M.D.   On: 01/21/2024 00:11    EKG: Independently reviewed. Sinus, no acute ST-T changes.  Assessment/Plan Principal Problem:   GI bleeding  (please populate well all  problems here in Problem List. (For example, if patient is on BP meds at home and you resume or decide to hold them, it is a problem that needs to be her. Same for CAD, COPD, HLD and so on)  Recurrent acute hematochezia Acute blood loss anemia -Likely secondary to recurrent duodenal ulcer bleeding/hemostatic clip dislodging -CTA negative for active bleeding.  Clinically patient appears to be stable after arrival in the ED, no more hematochezia.  Since last night. -N.p.o. PPI.  Likely will go to GI lab this morning for EGD -Repeat H&H this afternoon and tonight and transfuse for hemodynamic instability and significant drop of H&H.  HTN -Hold off all home BP meds -PRN Hydralazine   Chronic pain syndrome -Continue home regimen of OxyContin  50 mg 3 times daily and oxycodone  as needed -PDMP record was reviewed recently.  IIDM -Most recent A1c 6.6 indicating good control -Resume Synjardy  DVT prophylaxis: SCD Code Status: Full code Family Communication: None at bedside Disposition Plan: Expect less than two midnight hospital stay Consults called: GI Dr. Denys Admission status: PCU obs  Cort ONEIDA Mana MD [facility] Pager [phone]  01/21/2024, 9:19 AM

## 2024-01-21 NOTE — Transitions of Care (Post Inpatient/ED Visit) (Signed)
   01/21/2024  Name: Erica Ruiz MRN: [id number] DOB: March 19, 1961  Today's TOC FU Call Status: Today's TOC FU Call Status:: Unsuccessful Call (2nd Attempt) Unsuccessful Call (1st Attempt) Date: 01/20/24 Unsuccessful Call (2nd Attempt) Date: 01/21/24  Attempted to reach the patient regarding the most recent Inpatient/ED visit.Patient was called in an Outreach attempt to offer VBCI  30-day TOC program. Pt is eligible for program due to potential risk for readmission and/or high utilization. Unfortunately, I was not able to speak with the patient in regards to recent hospital discharge  On Chart Review patient is currently in the ER  01/20/2024 - present (1 day) [facility] at [facility]   Follow Up Plan: Additional outreach attempts will be made to reach the patient to complete the Transitions of Care (Post Inpatient/ED visit) call. If she is not admitted   Bari Mayans , BSN, RN [facility]   VBCI-Population Health RN Care Manager Direct Dial [phone]  Fax: [phone] Website: [email or web address]

## 2024-01-21 NOTE — ED Notes (Signed)
 This tech assisted pt. to the bathroom. Pt. wanted to make note that urine didn't have any blood at the time.

## 2024-01-21 NOTE — ED Notes (Signed)
 Pt has no adverse reactions to blood administration at this time. Pt and dtr aware to let staff know if this changes. PT is alert and oriented x 4. No complaints

## 2024-01-21 NOTE — ED Notes (Signed)
 Endo reporting will come get pt before noon. Ok to give PO meds at this time with sips of water 

## 2024-01-21 NOTE — ED Notes (Signed)
 Pt assisted to bedside toilet and staff witnessed moderate amount of loose, dark maroon stool.

## 2024-01-21 NOTE — Consult Note (Signed)
 Erica Copping, MD [facility]  [address] [place], [place] [address] Phone: [phone] Fax : [phone]  Consultation  Referring Provider:     Dr. Laurita Primary Care Physician:  Erica Cathryne BROCKS, MD Primary Gastroenterologist:  Dr. Copping         Reason for Consultation:     Upper GI bleed  Date of Admission:  01/20/2024 Date of Consultation:  01/21/2024         HPI:   Erica Ruiz is a 63 y.o. female who was recently in the hospital with hematemesis and had an upper endoscopy by Dr. Maryruth that showed a duodenal ulcer with a clot on it.  The patient was treated with injecting epinephrine , Hemospray and Purastat.  The patient also had a clip placed in the area for marking in case the patient needed interventional radiology.  The patient now had a CT angiography that did not show any active bleeding.  The patient also states that she passed some clots from her bottom since being in the hospital.  She felt a little lightheaded and dizzy after the reported large-volume hematemesis.  There is no report of any abdominal pain.  Past Medical History:  Diagnosis Date   Back pain    Diabetes mellitus without complication (HCC)    Hypertension     Past Surgical History:  Procedure Laterality Date   BREAST BIOPSY Right    neg bx/clip   CHOLECYSTECTOMY     COLON SURGERY     COLONOSCOPY WITH PROPOFOL  N/A 08/23/2018   Procedure: COLONOSCOPY WITH PROPOFOL  with biopsies;  Surgeon: Ruiz Rogelia, MD;  Location: [facility];  Service: Endoscopy;  Laterality: N/A;  DIABETIC   COLONOSCOPY WITH PROPOFOL  N/A 10/13/2023   Procedure: COLONOSCOPY WITH BIOPSY;  Surgeon: Ruiz Rogelia, MD;  Location: [facility];  Service: Endoscopy;  Laterality: N/A;  Diabetic   COLOSTOMY REVERSAL     ESOPHAGOGASTRODUODENOSCOPY (EGD) WITH PROPOFOL  N/A 01/17/2024   Procedure: ESOPHAGOGASTRODUODENOSCOPY (EGD) WITH PROPOFOL ;  Surgeon: Maryruth Ole DASEN, MD;  Location: [facility];  Service:  Endoscopy;  Laterality: N/A;   HEMOSTASIS CLIP PLACEMENT  01/17/2024   Procedure: HEMOSTASIS CLIP PLACEMENT;  Surgeon: Maryruth Ole DASEN, MD;  Location: [facility] END[facility];  Service: Endoscopy;;   HEMOSTASIS CONTROL  01/17/2024   Procedure: HEMOSTASIS CONTROL;  Surgeon: Maryruth Ole DASEN, MD;  Location: [facility] END[facility];  Service: Endoscopy;;   KNEE SURGERY     POLYPECTOMY N/A 08/23/2018   Procedure: POLYPECTOMY;  Surgeon: Ruiz Rogelia, MD;  Location: [facility];  Service: Endoscopy;  Laterality: N/A;   POLYPECTOMY  10/13/2023   Procedure: POLYPECTOMY;  Surgeon: Ruiz Rogelia, MD;  Location: [facility];  Service: Endoscopy;;   SUBMUCOSAL INJECTION  01/17/2024   Procedure: SUBMUCOSAL INJECTION;  Surgeon: Maryruth Ole DASEN, MD;  Location: [facility];  Service: Endoscopy;;   TONSILLECTOMY     TUBAL LIGATION      Prior to Admission medications   Medication Sig Start Date End Date Taking? Authorizing Provider  atorvastatin  (LIPITOR) 20 MG tablet Take 20 mg by mouth daily. 11/23/23   [provider]  DULoxetine  (CYMBALTA ) 30 MG capsule Take 30 mg by mouth daily. 03/18/18   [provider]  fluticasone  (FLONASE ) 50 MCG/ACT nasal spray Place 2 sprays into both nostrils daily. Patient taking differently: Place 2 sprays into both nostrils daily as needed for rhinitis or allergies. 06/17/20   Lavell Bari LABOR, FNP  gabapentin  (NEURONTIN ) 600 MG tablet Take  600 mg by mouth 2 (two) times daily. 03/19/18   [provider]  glucose blood test strip 1 each by Other route 2 (two) times daily. 10/06/18   Erica Cathryne BROCKS, MD  hydrochlorothiazide  (HYDRODIURIL ) 25 MG tablet Take 1 tablet (25 mg total) by mouth daily. 10/08/23   Erica Cathryne BROCKS, MD  Lancets [facility] ULTRASOFT) lancets 1 each by Other route 2 (two) times daily. 10/06/18   Erica Cathryne BROCKS, MD  lisinopril  (ZESTRIL ) 10 MG tablet Take 1 tablet (10 mg total) by mouth daily. 10/08/23   Erica Cathryne BROCKS, MD  morphine   (MS CONTIN ) 15 MG 12 hr tablet Take 15 mg by mouth every 8 (eight) hours as needed for pain. 03/27/18   [provider]  NARCAN  4 MG/0.1ML LIQD nasal spray kit Place 1 spray into the nose once. 07/11/19   [provider]  nicotine  (NICODERM CQ  - DOSED IN MG/24 HOURS) 21 mg/24hr patch Place 1 patch (21 mg total) onto the skin daily. 01/19/24 01/18/25  Barbarann Nest, MD  ondansetron  (ZOFRAN -ODT) 8 MG disintegrating tablet Take 1 tablet (8 mg total) by mouth every 8 (eight) hours as needed for nausea or vomiting. 01/04/24   Bernardino Ditch, NP  Oxycodone  HCl 10 MG TABS Take 10 mg by mouth 4 (four) times daily as needed. Pain clinic 11/05/20   [provider]  pantoprazole  (PROTONIX ) 40 MG tablet Take 1 tablet (40 mg total) by mouth 2 (two) times daily. 01/19/24 03/19/24  Barbarann Nest, MD  Probiotic Product (PROBIOTIC DAILY PO) Take 1 tablet by mouth daily.    [provider]  SYNJARDY XR 12.04-999 MG TB24 Take 2 tablets by mouth daily. [place] 01/21/22   [provider]  tiZANidine  (ZANAFLEX ) 4 MG tablet Take 4 mg by mouth every 8 (eight) hours as needed for muscle spasms. 03/18/18   [provider]    Family History  Problem Relation Age of Onset   Pulmonary embolism Mother    Cancer Father        prostate   Heart disease Father    Hypertension Father    Cancer Sister        non-hodgkin's lymphoma   Hepatitis C Sister    Cancer Brother        colon   Diabetes Brother    Heart disease Brother    Hypertension Brother    Diabetes Daughter    Diabetes Brother    Hypertension Sister    Cancer Brother        prostate   Breast cancer Paternal Aunt      Social History   Tobacco Use   Smoking status: Every Day    Current packs/day: 0.50    Average packs/day: 0.5 packs/day for 30.1 years (15.0 ttl pk-yrs)    Types: Cigarettes    Start date: 1995   Smokeless tobacco: Never  Vaping Use   Vaping status: Never Used  Substance Use Topics    Alcohol use: Never   Drug use: Never    Allergies as of 01/20/2024   (No Known Allergies)    Review of Systems:    All systems reviewed and negative except where noted in HPI.   Physical Exam:  Vital signs in last 24 hours: Temp:  [98.1 F (36.7 C)-100.2 F (37.9 C)] 98.1 F (36.7 C) (02/06 1030) Pulse Rate:  [88-118] 94 (02/06 1100) Resp:  [12-22] 14 (02/06 1100) BP: (86-118)/(52-78) 107/65 (02/06 1100) SpO2:  [95 %-100 %] 100 % (02/06  1100) Weight:  [77.1 kg] 77.1 kg (02/05 2141)   General:   Pleasant, cooperative in NAD Head:  Normocephalic and atraumatic. Eyes:   No icterus.   Conjunctiva pink. PERRLA. Ears:  Normal auditory acuity. Neck:  Supple; no masses or thyroidomegaly Lungs: Respirations even and unlabored. Lungs clear to auscultation bilaterally.   No wheezes, crackles, or rhonchi.  Heart:  Regular rate and rhythm;  Without murmur, clicks, rubs or gallops Abdomen:  Soft, nondistended, nontender. Normal bowel sounds. No appreciable masses or hepatomegaly.  No rebound or guarding.  Rectal:  Not performed. Msk:  Symmetrical without gross deformities.    Extremities:  Without edema, cyanosis or clubbing. Neurologic:  Alert and oriented x3;  grossly normal neurologically. Skin:  Intact without significant lesions or rashes. Cervical Nodes:  No significant cervical adenopathy. Psych:  Alert and cooperative. Normal affect.  LAB RESULTS: Recent Labs    01/19/24 9347 01/20/24 2205 01/21/24 0537  WBC 5.9 9.1 6.8  HGB 7.7* 7.5* 8.9*  HCT 23.6* 23.3* 26.8*  PLT 210 282 217   BMET Recent Labs    01/20/24 2205 01/21/24 0537  NA 138 140  K 3.3* 3.5  CL 101 105  CO2 25 27  GLUCOSE 118* 86  BUN 12 16  CREATININE 0.80 0.74  CALCIUM  8.4* 7.9*   LFT Recent Labs    01/20/24 2205  PROT 6.0*  ALBUMIN 3.3*  AST 15  ALT 12  ALKPHOS 34*  BILITOT 0.5   PT/INR Recent Labs    01/20/24 2216  LABPROT 14.0  INR 1.1    STUDIES: CT Angio Abd/Pel W and/or  Wo Contrast Result Date: 01/21/2024 CLINICAL DATA:  Lower gastrointestinal hemorrhage EXAM: CT ANGIOGRAPHY ABDOMEN AND PELVIS WITH CONTRAST AND WITHOUT CONTRAST TECHNIQUE: Multidetector CT imaging of the abdomen and pelvis was performed using the standard protocol during bolus administration of intravenous contrast. Multiplanar reconstructed images and MIPs were obtained and reviewed to evaluate the vascular anatomy. RADIATION DOSE REDUCTION: This exam was performed according to the departmental dose-optimization program which includes automated exposure control, adjustment of the mA and/or kV according to patient size and/or use of iterative reconstruction technique. CONTRAST:  OMNIPAQUE  IOHEXOL  350 MG/ML SOLN COMPARISON:  None Available. FINDINGS: VASCULAR Aorta: Normal caliber aorta without aneurysm, dissection, vasculitis or significant stenosis. Celiac: Patent without evidence of aneurysm, dissection, vasculitis or significant stenosis. SMA: Patent without evidence of aneurysm, dissection, vasculitis or significant stenosis. Renals: Both renal arteries are patent without evidence of aneurysm, dissection, vasculitis, fibromuscular dysplasia or significant stenosis. IMA: Patent without evidence of aneurysm, dissection, vasculitis or significant stenosis. 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 This aneurysm demonstrates a small wide surrounding fluid or inflammatory a tissue is suspicious for a mycotic aneurysm, less likely a posttraumatic aneurysm. Left lower extremity arterial inflow is unremarkable. Proximal Outflow: Unremarkable Veins: No obvious venous abnormality within the limitations of this arterial phase study. Review of the MIP images confirms the above findings. NON-VASCULAR Lower chest: No acute abnormality. Hepatobiliary: No focal liver abnormality is seen.  Status post cholecystectomy. No biliary dilatation. Pancreas: Unremarkable Spleen: Unremarkable Adrenals/Urinary Tract: Adrenal glands are unremarkable. Kidneys are normal, without renal calculi, focal lesion, or hydronephrosis. Bladder is unremarkable. Stomach/Bowel: Endoscopic clip noted within the duodenal wall. No active gastrointestinal hemorrhage is identified. Anastomotic staple likely related to sigmoid colectomy noted. The stomach, small bowel, and large bowel  are otherwise unremarkable. Appendix normal. No free intraperitoneal gas or fluid. Lymphatic: No pathologic adenopathy within the abdomen and pelvis. Reproductive: Uterus and bilateral adnexa are unremarkable. Other: Moderate fat containing umbilical hernia noted. 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 Superimposed advanced degenerative disc disease L4-5. Osseous structures are otherwise age-appropriate. No acute bone abnormality. IMPRESSION: 1. No active gastrointestinal hemorrhage identified. 2. 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 This is suspicious for a mycotic aneurysm, less likely a posttraumatic aneurysm. Vascular surgery consultation is recommended. 3. Moderate fat containing umbilical hernia. 4. 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 Superimposed advanced degenerative disc disease L4-5. Electronically Signed   By: Dorethia Molt M.D.   On: 01/21/2024 00:11      Impression / Plan:   Assessment: Principal Problem:   GI bleeding   Erica Ruiz is a 63 y.o. y/o female with with a history of a duodenal ulcer that was treated with epinephrine , Hemospray and Purastat.  The patient was discharged and came back to the hospital with recurrent bleeding.  The patient's hemoglobin 5 days ago was 9.2  that went down to 3.84 days ago and she came in with a hemoglobin of 7.5 and after transfusion she is now 8.9.  Plan:  The patient will be brought to the endoscopy unit today for a upper endoscopy to see if the duodenal ulcer has started to bleed.  The patient has been explained the risks and benefits and alternatives and agrees to the procedure.  If the patient should continue bleeding from this ulcer despite previous intervention and intervention today then the patient will need to be treated by interventional radiology versus vascular surgery and possibly even general surgery if the bleeding cannot be controlled.  PPI IV twice daily  Continue serial CBCs and transfuse PRN Avoid NSAIDs Maintain 2 large-bore IV lines Please page GI with any acute hemodynamic changes, or signs of active GI bleeding   Thank you for involving me in the care of this patient.      LOS: 0 days   Erica Copping, MD, MD. NOLIA 01/21/2024, 12:35 PM,  Pager [phone] 7am-5pm  Check AMION for 5pm -7am coverage and on weekends   Note: This dictation was prepared with Dragon dictation along with smaller phrase technology. Any transcriptional errors that result from this process are unintentional.

## 2024-01-21 NOTE — Anesthesia Preprocedure Evaluation (Addendum)
 Anesthesia Evaluation  Patient identified by MRN, date of birth, ID band Patient awake    Reviewed: Allergy & Precautions, NPO status , Patient's Chart, lab work & pertinent test results  History of Anesthesia Complications Negative for: history of anesthetic complications  Airway Mallampati: II   Neck ROM: Full    Dental  (+) Missing   Pulmonary Current Smoker and Patient abstained from smoking.   Pulmonary exam normal breath sounds clear to auscultation       Cardiovascular hypertension, Normal cardiovascular exam Rhythm:Regular Rate:Normal  ECG 01/20/24:  Sinus rhythm Low voltage, precordial leads   Neuro/Psych Chronic pain    GI/Hepatic negative GI ROS,,,  Endo/Other  diabetes, Type 2  Obesity   Renal/GU negative Renal ROS     Musculoskeletal  (+)  Fibromyalgia -, narcotic dependent  Abdominal   Peds  Hematology  (+) Blood dyscrasia, anemia   Anesthesia Other Findings   Reproductive/Obstetrics                             Anesthesia Physical Anesthesia Plan  ASA: 3  Anesthesia Plan: General   Post-op Pain Management:    Induction: Intravenous  PONV Risk Score and Plan: 2 and Propofol  infusion, TIVA and Treatment may vary due to age or medical condition  Airway Management Planned: Natural Airway  Additional Equipment:   Intra-op Plan:   Post-operative Plan:   Informed Consent: I have reviewed the patients History and Physical, chart, labs and discussed the procedure including the risks, benefits and alternatives for the proposed anesthesia with the patient or authorized representative who has indicated his/her understanding and acceptance.       Plan Discussed with: CRNA  Anesthesia Plan Comments: (LMA/GETA backup discussed.  Patient consented for risks of anesthesia including but not limited to:  - adverse reactions to medications - damage to eyes, teeth, lips or  other oral mucosa - nerve damage due to positioning  - sore throat or hoarseness - damage to heart, brain, nerves, lungs, other parts of body or loss of life  Informed patient about role of CRNA in peri- and intra-operative care.  Patient voiced understanding.)        Anesthesia Quick Evaluation

## 2024-01-21 NOTE — Transfer of Care (Signed)
 Immediate Anesthesia Transfer of Care Note  Patient: Erica Ruiz  Procedure(s) Performed: ESOPHAGOGASTRODUODENOSCOPY (EGD) WITH PROPOFOL  HOT HEMOSTASIS (ARGON PLASMA COAGULATION/BICAP)  Patient Location: PACU  Anesthesia Type:MAC  Level of Consciousness: sedated  Airway & Oxygen Therapy: Patient Spontanous Breathing and Patient connected to nasal cannula oxygen  Post-op Assessment: Report given to RN and Post -op Vital signs reviewed and stable  Post vital signs: stable  Last Vitals:  Vitals Value Taken Time  BP 90/51 01/21/24 1259  Temp 36.1 C 01/21/24 1259  Pulse 88 01/21/24 1307  Resp 17 01/21/24 1307  SpO2 100 % 01/21/24 1307  Vitals shown include unfiled device data.  Last Pain:  Vitals:   01/21/24 1259  TempSrc: Temporal  PainSc:       Patients Stated Pain Goal: 0 (01/21/24 9346)  Complications: No notable events documented.

## 2024-01-21 NOTE — ED Notes (Signed)
 This RN remains at bedside after administration of 2nd unit of PRBC. Pt's dtr also remains at bedside. Pt has no complaints at this time and is actually resting comfortably

## 2024-01-21 NOTE — Anesthesia Postprocedure Evaluation (Signed)
 Anesthesia Post Note  Patient: Erica Ruiz [facility]  Procedure(s) Performed: ESOPHAGOGASTRODUODENOSCOPY (EGD) WITH PROPOFOL  HOT HEMOSTASIS (ARGON PLASMA COAGULATION/BICAP)  Patient location during evaluation: PACU Anesthesia Type: General Level of consciousness: awake and alert, oriented and patient cooperative Pain management: pain level controlled Vital Signs Assessment: post-procedure vital signs reviewed and stable Respiratory status: spontaneous breathing, nonlabored ventilation and respiratory function stable Cardiovascular status: blood pressure returned to baseline and stable Postop Assessment: adequate PO intake Anesthetic complications: no   No notable events documented.   Last Vitals:  Vitals:   01/21/24 1309 01/21/24 1319  BP: (!) 101/57 115/65  Pulse: 90 88  Resp: 19 20  Temp:    SpO2: 99% 98%    Last Pain:  Vitals:   01/21/24 1309  TempSrc:   PainSc: 0-No pain                 Alfonso Ruths

## 2024-01-22 ENCOUNTER — Telehealth: Payer: Self-pay | Admitting: Gastroenterology

## 2024-01-22 ENCOUNTER — Encounter: Payer: Self-pay | Admitting: Gastroenterology

## 2024-01-22 DIAGNOSIS — Z6832 Body mass index (BMI) 32.0-32.9, adult: Secondary | ICD-10-CM

## 2024-01-22 DIAGNOSIS — E1165 Type 2 diabetes mellitus with hyperglycemia: Secondary | ICD-10-CM

## 2024-01-22 DIAGNOSIS — I723 Aneurysm of iliac artery: Secondary | ICD-10-CM

## 2024-01-22 DIAGNOSIS — K264 Chronic or unspecified duodenal ulcer with hemorrhage: Secondary | ICD-10-CM | POA: Diagnosis not present

## 2024-01-22 DIAGNOSIS — E66811 Obesity, class 1: Secondary | ICD-10-CM | POA: Diagnosis not present

## 2024-01-22 DIAGNOSIS — Z794 Long term (current) use of insulin: Secondary | ICD-10-CM | POA: Diagnosis not present

## 2024-01-22 DIAGNOSIS — I10 Essential (primary) hypertension: Secondary | ICD-10-CM

## 2024-01-22 DIAGNOSIS — K920 Hematemesis: Secondary | ICD-10-CM | POA: Diagnosis not present

## 2024-01-22 DIAGNOSIS — E6609 Other obesity due to excess calories: Secondary | ICD-10-CM

## 2024-01-22 LAB — CBC
HCT: 24.3 % — ABNORMAL LOW (ref 36.0–46.0)
Hemoglobin: 8.2 g/dL — ABNORMAL LOW (ref 12.0–15.0)
MCH: 29 pg (ref 26.0–34.0)
MCHC: 33.7 g/dL (ref 30.0–36.0)
MCV: 85.9 fL (ref 80.0–100.0)
Platelets: 207 10*3/uL (ref 150–400)
RBC: 2.83 MIL/uL — ABNORMAL LOW (ref 3.87–5.11)
RDW: 15.2 % (ref 11.5–15.5)
WBC: 5.7 10*3/uL (ref 4.0–10.5)
nRBC: 0 % (ref 0.0–0.2)

## 2024-01-22 LAB — GLUCOSE, CAPILLARY
Glucose-Capillary: 69 mg/dL — ABNORMAL LOW (ref 70–99)
Glucose-Capillary: 111 mg/dL — ABNORMAL HIGH (ref 70–99)

## 2024-01-22 NOTE — Care Management Obs Status (Signed)
 MEDICARE OBSERVATION STATUS NOTIFICATION   Patient Details  Name: Erica Ruiz MRN: [id number] Date of Birth: 20-Oct-1961   Medicare Observation Status Notification Given:  Yes    Felix Host 01/22/2024, 11:40 AM

## 2024-01-22 NOTE — Discharge Summary (Signed)
 Physician Discharge Summary   Patient: Erica Ruiz MRN: [id number] DOB: 1961/01/15  Admit date:     01/20/2024  Discharge date: 01/22/24  Discharge Physician: Concepcion Riser   PCP: Joshua Cathryne BROCKS, MD   Recommendations at discharge:   PCP follow up in 1 week GI follow up as scheduled. Vascular surgery follow up in 3-4 weeks for right common iliac aneurysm management.  Discharge Diagnoses: Principal Problem:   GI bleeding Active Problems:   Hematemesis with nausea   Duodenal ulcer hemorrhage  Resolved Problems:   * No resolved hospital problems. *  Hospital Course: Erica Ruiz is a 63 y.o. female with medical history significant of recent GI bleeding secondary to duodenal ulcer from NSAID's use, HTN, HLD, chronic pain syndrome, IIDM, presented with recurrent GI bleed after a EGD, injection hemostatic spray and hemostatic clips 2 days ago.  Patient is admitted to the hospitalist service for recurrent acute hematochezia, blood loss anemia. Seen by GI, who performed upper endoscopy showed normal esophagus, normal stomach, oozing duodenal ulcer pigmented material treated with argon plasma coagulation.  Patient did not have any more bleeding or dark stools.  Patient is hemodynamically stable to be discharged home.  Advised her to follow-up with PCP and GI as scheduled.  Patient and her daughter understands and agrees with the discharge plan.  Assessment and Plan: Recurrent acute hematochezia Acute blood loss anemia Likely secondary to recurrent duodenal ulcer bleeding/hemostatic clip dislodging CTA negative for active bleeding.  Clinically patient appears to be stable after arrival in the ED, no more hematochezia.  Since last night. GI performed EGD which did not show oozing duodenal ulcer (treated with APC. Repeat H&H remained stable.  She does not have sepsis hematochezia and is hemodynamically stable.   Hypertension Resume home blood pressure medications.    Chronic pain syndrome Continue home regimen of OxyContin  50 mg 3 times daily and oxycodone  as needed   Type 2 diabetes mellitus Recent A1c 6.6 indicating good control Resume Synjardy.  Aneurysm right common iliac: CT scan revealed 18 mm multilobulated, saccular, irregular aneurysm of the right common iliac artery terminating just above the common iliac bifurcation. This is suspicious for a mycotic aneurysm, less likely a posttraumatic aneurysm.  I discussed with vascular surgery team who advised follow-up in 3 to 4 weeks for further management.  Obesity class I BMI 32.12. Diet can exercise and weight reduction advised.      Consultants: GI Procedures performed: EGD  Disposition: Home Diet recommendation:  Discharge Diet Orders (From admission, onward)     Start     Ordered   01/22/24 0000  Diet - low sodium heart healthy        01/22/24 1129   01/22/24 0000  Diet Carb Modified        01/22/24 1129           Cardiac and Carb modified diet DISCHARGE MEDICATION: Allergies as of 01/22/2024   No Known Allergies      Medication List     TAKE these medications    atorvastatin  20 MG tablet Commonly known as: LIPITOR Take 20 mg by mouth daily.   DULoxetine  30 MG capsule Commonly known as: CYMBALTA  Take 30 mg by mouth daily.   fluticasone  50 MCG/ACT nasal spray Commonly known as: FLONASE  Place 2 sprays into both nostrils daily. What changed:  when to take this reasons to take this   gabapentin  600 MG tablet Commonly known as: NEURONTIN  Take 600 mg by mouth 2 (  two) times daily.   glucose blood test strip 1 each by Other route 2 (two) times daily.   hydrochlorothiazide  25 MG tablet Commonly known as: HYDRODIURIL  Take 1 tablet (25 mg total) by mouth daily.   lisinopril  10 MG tablet Commonly known as: ZESTRIL  Take 1 tablet (10 mg total) by mouth daily.   morphine  15 MG 12 hr tablet Commonly known as: MS CONTIN  Take 15 mg by mouth every 8 (eight) hours  as needed for pain.   Narcan  4 MG/0.1ML Liqd nasal spray kit Generic drug: naloxone  Place 1 spray into the nose once.   nicotine  21 mg/24hr patch Commonly known as: NICODERM CQ  - dosed in mg/24 hours Place 1 patch (21 mg total) onto the skin daily.   ondansetron  8 MG disintegrating tablet Commonly known as: ZOFRAN -ODT Take 1 tablet (8 mg total) by mouth every 8 (eight) hours as needed for nausea or vomiting.   onetouch ultrasoft lancets 1 each by Other route 2 (two) times daily.   Oxycodone  HCl 10 MG Tabs Take 10 mg by mouth 4 (four) times daily as needed. Pain clinic   pantoprazole  40 MG tablet Commonly known as: Protonix  Take 1 tablet (40 mg total) by mouth 2 (two) times daily.   PROBIOTIC DAILY PO Take 1 tablet by mouth daily.   Synjardy XR 12.04-999 MG Tb24 Generic drug: Empagliflozin -metFORMIN  HCl ER Take 2 tablets by mouth daily. [place]   tiZANidine  4 MG tablet Commonly known as: ZANAFLEX  Take 4 mg by mouth every 8 (eight) hours as needed for muscle spasms.        Follow-up Information     Joshua Cathryne BROCKS, MD Follow up in 1 week(s).   Specialty: Family Medicine Contact information: [address] [place] [address] [phone]         Jinny Carmine, MD. Schedule an appointment as soon as possible for a visit in 3 week(s).   Specialty: Gastroenterology Contact information: [address] [place]  [place] [address] [phone]                Discharge Exam: Filed Weights   01/20/24 2141  Weight: 77.1 kg      01/22/2024   11:48 AM 01/22/2024   11:46 AM 01/22/2024    8:20 AM  Vitals with BMI  Systolic 97 83 121  Diastolic 59 53 72  Pulse 88 88 88   General - Elderly obese Caucasian female, no apparent distress HEENT - PERRLA, EOMI, atraumatic head, non tender sinuses. Lung - Clear, rales, rhonchi, wheezes. Heart - S1, S2 heard, no murmurs, rubs, trace pedal edema Neuro - Alert, awake and oriented x 3, non focal exam. Skin  - Warm and dry.  Condition at discharge: stable  The results of significant diagnostics from this hospitalization (including imaging, microbiology, ancillary and laboratory) are listed below for reference.   Imaging Studies: CT Angio Abd/Pel W and/or Wo Contrast Result Date: 01/21/2024 CLINICAL DATA:  Lower gastrointestinal hemorrhage EXAM: CT ANGIOGRAPHY ABDOMEN AND PELVIS WITH CONTRAST AND WITHOUT CONTRAST TECHNIQUE: Multidetector CT imaging of the abdomen and pelvis was performed using the standard protocol during bolus administration of intravenous contrast. Multiplanar reconstructed images and MIPs were obtained and reviewed to evaluate the vascular anatomy. RADIATION DOSE REDUCTION: This exam was performed according to the departmental dose-optimization program which includes automated exposure control, adjustment of the mA and/or kV according to patient size and/or use of iterative reconstruction technique. CONTRAST:  OMNIPAQUE  IOHEXOL  350 MG/ML SOLN COMPARISON:  None  Available. FINDINGS: VASCULAR Aorta: Normal caliber aorta without aneurysm, dissection, vasculitis or significant stenosis. Celiac: Patent without evidence of aneurysm, dissection, vasculitis or significant stenosis. SMA: Patent without evidence of aneurysm, dissection, vasculitis or significant stenosis. Renals: Both renal arteries are patent without evidence of aneurysm, dissection, vasculitis, fibromuscular dysplasia or significant stenosis. IMA: Patent without evidence of aneurysm, dissection, vasculitis or significant stenosis. 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 This aneurysm demonstrates a small wide surrounding fluid or inflammatory a tissue is suspicious for a mycotic aneurysm, less likely a posttraumatic aneurysm. Left lower extremity arterial inflow is unremarkable. Proximal  Outflow: Unremarkable Veins: No obvious venous abnormality within the limitations of this arterial phase study. Review of the MIP images confirms the above findings. NON-VASCULAR Lower chest: No acute abnormality. Hepatobiliary: No focal liver abnormality is seen. Status post cholecystectomy. No biliary dilatation. Pancreas: Unremarkable Spleen: Unremarkable Adrenals/Urinary Tract: Adrenal glands are unremarkable. Kidneys are normal, without renal calculi, focal lesion, or hydronephrosis. Bladder is unremarkable. Stomach/Bowel: Endoscopic clip noted within the duodenal wall. No active gastrointestinal hemorrhage is identified. Anastomotic staple likely related to sigmoid colectomy noted. The stomach, small bowel, and large bowel are otherwise unremarkable. Appendix normal. No free intraperitoneal gas or fluid. Lymphatic: No pathologic adenopathy within the abdomen and pelvis. Reproductive: Uterus and bilateral adnexa are unremarkable. Other: Moderate fat containing umbilical hernia noted. 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 Superimposed advanced degenerative disc disease L4-5. Osseous structures are otherwise age-appropriate. No acute bone abnormality. IMPRESSION: 1. No active gastrointestinal hemorrhage identified. 2. 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 This is suspicious for a mycotic aneurysm, less likely a posttraumatic aneurysm. Vascular surgery consultation is recommended. 3. Moderate fat containing umbilical hernia. 4. 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 Superimposed advanced degenerative disc disease L4-5. Electronically Signed   By: Dorethia Molt M.D.   On: 01/21/2024 00:11   MM 3D SCREENING MAMMOGRAM BILATERAL BREAST Result Date: 01/07/2024 CLINICAL DATA:   Screening. EXAM: DIGITAL SCREENING BILATERAL MAMMOGRAM WITH TOMOSYNTHESIS AND CAD TECHNIQUE: Bilateral screening digital craniocaudal and mediolateral oblique mammograms were obtained. Bilateral screening digital breast tomosynthesis was performed. The images were evaluated with computer-aided detection. COMPARISON:  Previous exam(s). ACR Breast Density Category b: There are scattered areas of fibroglandular density. FINDINGS: There are no findings suspicious for malignancy. IMPRESSION: No mammographic evidence of malignancy. A result letter of this screening mammogram will be mailed directly to the patient. RECOMMENDATION: Screening mammogram in one year. (Code:SM-B-01Y) BI-RADS CATEGORY  1: Negative. Electronically Signed   By: Toribio Agreste M.D.   On: 01/07/2024 15:01    Microbiology: No results found for this or any previous visit.  Labs: CBC: Recent Labs  Lab 01/17/24 0447 01/18/24 9047 01/19/24 0652 01/20/24 2205 01/21/24 0537 01/21/24 1550 01/21/24 2215 01/22/24 0430  WBC 7.0 5.8 5.9 9.1 6.8  --   --  5.7  NEUTROABS 4.2 3.3 3.2 6.8  --   --   --   --   HGB 8.3* 7.4* 7.7* 7.5* 8.9* 8.4* 8.6* 8.2*  HCT 25.2* 22.4* 23.6* 23.3* 26.8* 24.5* 25.8* 24.3*  MCV 88.1 88.9 87.7 89.3 87.0  --   --  85.9  PLT 234 199 210 282 217  --   --  207   Basic Metabolic Panel: Recent Labs  Lab 01/16/24 1557 01/17/24 0447 01/18/24 0952 01/20/24 2205 01/21/24 0537  NA 135 138 138 138 140  K 4.1 3.8 3.4* 3.3* 3.5  CL 99 105 104 101 105  CO2 26 25 25 25 27   GLUCOSE 123* 105* 119* 118* 86  BUN 54* 34* 13 12 16   CREATININE 0.81 0.63 0.51 0.80 0.74  CALCIUM  8.6* 8.3* 8.2* 8.4* 7.9*   Liver Function Tests: Recent Labs  Lab 01/16/24 1557 01/20/24 2205  AST 14* 15  ALT 12 12  ALKPHOS 31* 34*  BILITOT 0.4 0.5  PROT 5.7* 6.0*  ALBUMIN 2.8* 3.3*   CBG: Recent Labs  Lab 01/19/24 0734 01/19/24 1127 01/21/24 1205 01/21/24 1306 01/22/24 0822  GLUCAP 97 122* 46* 98 69*    Discharge  time spent: 34 minutes.  Signed: Concepcion Riser, MD [facility] 01/22/2024

## 2024-01-22 NOTE — Telephone Encounter (Signed)
 [place] from the nurse desk at the ED called in to schedule a patient for a ED follow up.

## 2024-01-23 DIAGNOSIS — I723 Aneurysm of iliac artery: Secondary | ICD-10-CM | POA: Insufficient documentation

## 2024-01-23 LAB — PREPARE RBC (CROSSMATCH)

## 2024-01-29 ENCOUNTER — Encounter: Payer: Self-pay | Admitting: Family Medicine

## 2024-01-29 ENCOUNTER — Other Ambulatory Visit: Payer: Self-pay | Admitting: Family Medicine

## 2024-01-29 ENCOUNTER — Ambulatory Visit: Payer: PPO | Admitting: Family Medicine

## 2024-01-29 VITALS — BP 122/72 | HR 103 | Ht 61.0 in

## 2024-01-29 DIAGNOSIS — Z09 Encounter for follow-up examination after completed treatment for conditions other than malignant neoplasm: Secondary | ICD-10-CM

## 2024-01-29 DIAGNOSIS — E119 Type 2 diabetes mellitus without complications: Secondary | ICD-10-CM

## 2024-01-29 DIAGNOSIS — G894 Chronic pain syndrome: Secondary | ICD-10-CM | POA: Diagnosis not present

## 2024-01-29 DIAGNOSIS — Z7984 Long term (current) use of oral hypoglycemic drugs: Secondary | ICD-10-CM | POA: Diagnosis not present

## 2024-01-29 DIAGNOSIS — I10 Essential (primary) hypertension: Secondary | ICD-10-CM | POA: Diagnosis not present

## 2024-01-29 DIAGNOSIS — D500 Iron deficiency anemia secondary to blood loss (chronic): Secondary | ICD-10-CM

## 2024-01-29 DIAGNOSIS — I723 Aneurysm of iliac artery: Secondary | ICD-10-CM

## 2024-01-29 NOTE — Progress Notes (Signed)
Date:  01/29/2024   Name:  Rubee Vega [facility]   DOB:  [birth year]   MRN:  [id number]   Chief Complaint: Hospitalization Follow-up (DM and GI Bleed- feels tired/)  Follow up Hospitalization  Patient was admitted to [facility] on 01/20/24 and discharged on 01/22/24. She was treated for acute gi bleed /upper/ . Treatment for this included transfusiopnx3/ endoscopy/ . Telephone follow up was done on unsuccessful She reports good compliance with treatment. She reports this condition is improved.  ----------------------------------------------------------------------------------------- -     GI Problem The primary symptoms include fatigue. Primary symptoms do not include fever, abdominal pain, nausea, vomiting, diarrhea, melena, hematemesis, jaundice, hematochezia or dysuria. The illness began more than 7 days ago.  The illness does not include chills, constipation or back pain. Significant associated medical issues include diverticulitis. Associated medical issues do not include alcohol abuse or gastric bypass.  Anemia Presents for follow-up visit. There has been no abdominal pain, bruising/bleeding easily, fever, malaise/fatigue, pallor, palpitations or paresthesias. Signs of blood loss that are not present include hematemesis, hematochezia, melena, menorrhagia and vaginal bleeding. There are no compliance problems.  Side effects of medications include fatigue.    Lab Results  Component Value Date   NA 140 01/21/2024   K 3.5 01/21/2024   CO2 27 01/21/2024   GLUCOSE 86 01/21/2024   BUN 16 01/21/2024   CREATININE 0.74 01/21/2024   CALCIUM 7.9 (L) 01/21/2024   EGFR 75 04/13/2023   GFRNONAA >60 01/21/2024   Lab Results  Component Value Date   CHOL 165 04/13/2023   HDL 54 04/13/2023   LDLCALC 92 04/13/2023   LDLDIRECT 97 10/10/2022   TRIG 107 04/13/2023   CHOLHDL 3.3 07/15/2021   Lab Results  Component Value Date   TSH 1.070 10/10/2022   Lab Results  Component Value  Date   HGBA1C 6.6 (H) 01/17/2024   Lab Results  Component Value Date   WBC 5.7 01/22/2024   HGB 8.2 (L) 01/22/2024   HCT 24.3 (L) 01/22/2024   MCV 85.9 01/22/2024   PLT 207 01/22/2024   Lab Results  Component Value Date   ALT 12 01/20/2024   AST 15 01/20/2024   ALKPHOS 34 (L) 01/20/2024   BILITOT 0.5 01/20/2024   No results found for: "25OHVITD2", "25OHVITD3", "VD25OH"   Review of Systems  Constitutional:  Positive for fatigue. Negative for chills, fever and malaise/fatigue.  Respiratory:  Negative for chest tightness and shortness of breath.   Cardiovascular:  Negative for chest pain, palpitations and leg swelling.  Gastrointestinal:  Negative for abdominal pain, constipation, diarrhea, hematemesis, hematochezia, jaundice, melena, nausea and vomiting.  Genitourinary:  Negative for dysuria, menorrhagia and vaginal bleeding.  Musculoskeletal:  Negative for back pain.  Skin:  Negative for pallor.  Neurological:  Negative for paresthesias.  Hematological:  Does not bruise/bleed easily.    Patient Active Problem List   Diagnosis Date Noted   Aneurysm of right common iliac artery (HCC) 01/23/2024   GI bleeding 01/21/2024   Hematemesis with nausea 01/21/2024   Duodenal ulcer hemorrhage 01/21/2024   Acute upper GI bleed 01/16/2024   Symptomatic anemia 01/16/2024   Hypotension 01/16/2024   Lower extremity edema 01/16/2024   Chronic pain syndrome 01/16/2024   History of colonic polyps 10/13/2023   Polyp of transverse colon 10/13/2023   Type 2 diabetes mellitus with hyperglycemia, without long-term current use of insulin (HCC) 09/17/2018   Tobacco dependence 09/17/2018   Special screening for malignant neoplasms, colon  Benign neoplasm of transverse colon    Polyp of sigmoid colon    Rectal polyp    Hyperglycemia 04/16/2018   Essential hypertension 04/16/2018   Venous insufficiency 04/16/2018   Abnormal weight gain 04/16/2018    No Known Allergies  Past Surgical  History:  Procedure Laterality Date   BREAST BIOPSY Right    neg bx/clip   CHOLECYSTECTOMY  unsure   COLON SURGERY  unsure   COLONOSCOPY WITH PROPOFOL N/A 08/23/2018   Procedure: COLONOSCOPY WITH PROPOFOL with biopsies;  Surgeon: Midge Minium, MD;  Location: [facility];  Service: Endoscopy;  Laterality: N/A;  DIABETIC   COLONOSCOPY WITH PROPOFOL N/A 10/13/2023   Procedure: COLONOSCOPY WITH BIOPSY;  Surgeon: Midge Minium, MD;  Location: [facility];  Service: Endoscopy;  Laterality: N/A;  Diabetic   COLOSTOMY REVERSAL     ESOPHAGOGASTRODUODENOSCOPY (EGD) WITH PROPOFOL N/A 01/17/2024   Procedure: ESOPHAGOGASTRODUODENOSCOPY (EGD) WITH PROPOFOL;  Surgeon: Regis Bill, MD;  Location: [facility] ENDOSCOPY;  Service: Endoscopy;  Laterality: N/A;   ESOPHAGOGASTRODUODENOSCOPY (EGD) WITH PROPOFOL N/A 01/21/2024   Procedure: ESOPHAGOGASTRODUODENOSCOPY (EGD) WITH PROPOFOL;  Surgeon: Midge Minium, MD;  Location: [facility] ENDOSCOPY;  Service: Endoscopy;  Laterality: N/A;   HEMOSTASIS CLIP PLACEMENT  01/17/2024   Procedure: HEMOSTASIS CLIP PLACEMENT;  Surgeon: Regis Bill, MD;  Location: [facility] ENDOSCOPY;  Service: Endoscopy;;   HEMOSTASIS CONTROL  01/17/2024   Procedure: HEMOSTASIS CONTROL;  Surgeon: Regis Bill, MD;  Location: [facility] ENDOSCOPY;  Service: Endoscopy;;   HOT HEMOSTASIS  01/21/2024   Procedure: HOT HEMOSTASIS (ARGON PLASMA COAGULATION/BICAP);  Surgeon: Midge Minium, MD;  Location: [facility] ENDOSCOPY;  Service: Endoscopy;;   KNEE SURGERY     POLYPECTOMY N/A 08/23/2018   Procedure: POLYPECTOMY;  Surgeon: Midge Minium, MD;  Location: [facility];  Service: Endoscopy;  Laterality: N/A;   POLYPECTOMY  10/13/2023   Procedure: POLYPECTOMY;  Surgeon: Midge Minium, MD;  Location: [facility];  Service: Endoscopy;;   SUBMUCOSAL INJECTION  01/17/2024   Procedure: SUBMUCOSAL INJECTION;  Surgeon: Regis Bill, MD;  Location: [facility] ENDOSCOPY;  Service:  Endoscopy;;   TONSILLECTOMY     TUBAL LIGATION  1991    Social History   Tobacco Use   Smoking status: Every Day    Current packs/day: 0.50    Average packs/day: 0.5 packs/day for 30.1 years (15.1 ttl pk-yrs)    Types: Cigarettes    Start date: 1995   Smokeless tobacco: Never  Vaping Use   Vaping status: Never Used  Substance Use Topics   Alcohol use: Never   Drug use: Never     Medication list has been reviewed and updated.  Current Meds  Medication Sig   atorvastatin (LIPITOR) 20 MG tablet Take 20 mg by mouth daily.   DULoxetine (CYMBALTA) 30 MG capsule Take 30 mg by mouth daily.   fluticasone (FLONASE) 50 MCG/ACT nasal spray Place 2 sprays into both nostrils daily. (Patient taking differently: Place 2 sprays into both nostrils daily as needed for rhinitis or allergies.)   gabapentin (NEURONTIN) 600 MG tablet Take 600 mg by mouth 2 (two) times daily.   glucose blood test strip 1 each by Other route 2 (two) times daily.   hydrochlorothiazide (HYDRODIURIL) 25 MG tablet Take 1 tablet (25 mg total) by mouth daily.   Lancets (ONETOUCH ULTRASOFT) lancets 1 each by Other route 2 (two) times daily.   lisinopril (ZESTRIL) 10 MG tablet Take 1 tablet (10 mg total) by mouth daily.   morphine (MS CONTIN)  15 MG 12 hr tablet Take 15 mg by mouth every 8 (eight) hours as needed for pain.   NARCAN 4 MG/0.1ML LIQD nasal spray kit Place 1 spray into the nose once.   nicotine (NICODERM CQ - DOSED IN MG/24 HOURS) 21 mg/24hr patch Place 1 patch (21 mg total) onto the skin daily.   ondansetron (ZOFRAN-ODT) 8 MG disintegrating tablet Take 1 tablet (8 mg total) by mouth every 8 (eight) hours as needed for nausea or vomiting.   Oxycodone HCl 10 MG TABS Take 10 mg by mouth 4 (four) times daily as needed. Pain clinic   pantoprazole (PROTONIX) 40 MG tablet Take 1 tablet (40 mg total) by mouth 2 (two) times daily.   Probiotic Product (PROBIOTIC DAILY PO) Take 1 tablet by mouth daily.   SYNJARDY XR  12.04-999 MG TB24 Take 2 tablets by mouth daily. O'Connell   tiZANidine (ZANAFLEX) 4 MG tablet Take 4 mg by mouth every 8 (eight) hours as needed for muscle spasms.       10/08/2023    3:41 PM 04/13/2023    2:02 PM 10/10/2022    2:07 PM 08/15/2022    3:18 PM  GAD 7 : Generalized Anxiety Score  Nervous, Anxious, on Edge 0  0 0  Control/stop worrying 0 0 0 0  Worry too much - different things 0 0 0 0  Trouble relaxing 0 0 0 0  Restless 0 0 0 0  Easily annoyed or irritable 0 0 0 0  Afraid - awful might happen 0 0 0 0  Total GAD 7 Score 0  0 0  Anxiety Difficulty Not difficult at all Not difficult at all Not difficult at all Not difficult at all       10/08/2023    3:41 PM 09/22/2023    2:34 PM 04/13/2023    2:01 PM  Depression screen PHQ 2/9  Decreased Interest 0 0 0  Down, Depressed, Hopeless 0 0 0  PHQ - 2 Score 0 0 0  Altered sleeping 2 0 0  Tired, decreased energy 1 0 0  Change in appetite 0 0 0  Feeling bad or failure about yourself  0 0 0  Trouble concentrating 0 0 0  Moving slowly or fidgety/restless 0 0 0  Suicidal thoughts 0 0 0  PHQ-9 Score 3 0 0  Difficult doing work/chores Not difficult at all Not difficult at all Not difficult at all    BP Readings from Last 3 Encounters:  01/29/24 122/72  01/22/24 (!) 97/59  01/19/24 121/68    Physical Exam Constitutional:      General: She is not in acute distress.    Appearance: She is not diaphoretic.  HENT:     Head: Normocephalic and atraumatic.     Right Ear: External ear normal.     Left Ear: External ear normal.     Nose: Nose normal.  Eyes:     General:        Right eye: No discharge.        Left eye: No discharge.     Conjunctiva/sclera: Conjunctivae normal.     Pupils: Pupils are equal, round, and reactive to light.  Neck:     Thyroid: No thyromegaly.     Vascular: No JVD.  Cardiovascular:     Rate and Rhythm: Normal rate and regular rhythm.     Heart sounds: Normal heart sounds. No murmur heard.     No friction rub. No gallop.  Pulmonary:  Effort: Pulmonary effort is normal.     Breath sounds: Normal breath sounds. No wheezing, rhonchi or rales.  Abdominal:     General: Bowel sounds are normal.     Palpations: Abdomen is soft. There is no mass.     Tenderness: There is no abdominal tenderness. There is no guarding.  Musculoskeletal:        General: Normal range of motion.     Cervical back: Normal range of motion and neck supple.  Lymphadenopathy:     Cervical: No cervical adenopathy.  Skin:    General: Skin is warm and dry.  Neurological:     Mental Status: She is alert.     Deep Tendon Reflexes: Reflexes are normal and symmetric.     Wt Readings from Last 3 Encounters:  01/20/24 170 lb (77.1 kg)  01/04/24 175 lb 12.8 oz (79.7 kg)  10/13/23 172 lb (78 kg)    BP 122/72   Pulse (!) 103   Ht 5\' 1"  (1.549 m)   SpO2 95%   BMI 32.12 kg/m  [facility]IC[facility] AT [facility]                                   Transitional Care Clinic   [facility] Discharge Acute Issues Care Follow Up                                                                        Patient Demographics  Jakera Beaupre, is a 62 y.o. female  DOB 20-Apr-1961  MRN [id number].  Primary MD  Duanne Limerick, MD   Reason for TCC follow Up -to assure that patient is hemodynamically stable status post upper GI bleed and that reaching hemodynamic stasis with recheck of CBC and follow-up visits are discussed that patient is aware.   Past Medical History:  Diagnosis Date   Arthritis unsure   Back pain    Diabetes mellitus without complication (HCC)    GERD (gastroesophageal reflux disease) unsure   Hypertension     Past Surgical History:  Procedure Laterality Date   BREAST BIOPSY Right    neg bx/clip   CHOLECYSTECTOMY  unsure   COLON SURGERY  unsure   COLONOSCOPY WITH PROPOFOL N/A 08/23/2018   Procedure: COLONOSCOPY WITH  PROPOFOL with biopsies;  Surgeon: Midge Minium, MD;  Location: [facility];  Service: Endoscopy;  Laterality: N/A;  DIABETIC   COLONOSCOPY WITH PROPOFOL N/A 10/13/2023   Procedure: COLONOSCOPY WITH BIOPSY;  Surgeon: Midge Minium, MD;  Location: [facility];  Service: Endoscopy;  Laterality: N/A;  Diabetic   COLOSTOMY REVERSAL     ESOPHAGOGASTRODUODENOSCOPY (EGD) WITH PROPOFOL N/A 01/17/2024   Procedure: ESOPHAGOGASTRODUODENOSCOPY (EGD) WITH PROPOFOL;  Surgeon: Regis Bill, MD;  Location: [facility] ENDOSCOPY;  Service: Endoscopy;  Laterality: N/A;   ESOPHAGOGASTRODUODENOSCOPY (EGD) WITH PROPOFOL N/A 01/21/2024   Procedure: ESOPHAGOGASTRODUODENOSCOPY (EGD) WITH PROPOFOL;  Surgeon: Midge Minium, MD;  Location: [facility] ENDOSCOPY;  Service: Endoscopy;  Laterality: N/A;   HEMOSTASIS CLIP PLACEMENT  01/17/2024   Procedure: HEMOSTASIS CLIP PLACEMENT;  Surgeon: Regis Bill, MD;  Location: [facility] ENDOSCOPY;  Service: Endoscopy;;   HEMOSTASIS CONTROL  01/17/2024   Procedure: HEMOSTASIS CONTROL;  Surgeon: Regis Bill, MD;  Location: [facility] ENDOSCOPY;  Service: Endoscopy;;   HOT HEMOSTASIS  01/21/2024   Procedure: HOT HEMOSTASIS (ARGON PLASMA COAGULATION/BICAP);  Surgeon: Midge Minium, MD;  Location: [facility] ENDOSCOPY;  Service: Endoscopy;;   KNEE SURGERY     POLYPECTOMY N/A 08/23/2018   Procedure: POLYPECTOMY;  Surgeon: Midge Minium, MD;  Location: [facility];  Service: Endoscopy;  Laterality: N/A;   POLYPECTOMY  10/13/2023   Procedure: POLYPECTOMY;  Surgeon: Midge Minium, MD;  Location: [facility];  Service: Endoscopy;;   SUBMUCOSAL INJECTION  01/17/2024   Procedure: SUBMUCOSAL INJECTION;  Surgeon: Regis Bill, MD;  Location: [facility] ENDOSCOPY;  Service: Endoscopy;;   TONSILLECTOMY     TUBAL LIGATION  1991   Recent HPI and Hospital course: Patient has had no progression of her symptomatology and is currently resuming her medications including  antihypertension and pain management medications well.  Patient has not been taking anymore nonsteroidal anti-inflammatory medications at this time.  Hospital course was consistent with endoscopy in which they attempted to control the bleeding with and applied measure which did not hold up and patient ended up having to have reendoscopic procedure with cauterization of a bleed with success.  Discharges been uneventful.  Post Hospital acute care issue to be followed up in clinic: I would suspect that we need to recheck CBC for current status of hemoglobin correction with reticulocyte count to see if this is getting sufficient iron and if this is not the case we will be supplementing.  Patient does have an upcoming appoint with Dr. Daleen Squibb gastrointestinal for follow-up of postprocedure and any continuance of gastrointestinal medications or further procedures.  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        Subjective:   Allene Pyo today has, No headache, No chest pain, No abdominal pain - No Nausea, No new weakness tingling or numbness, No Cough - SOB. Patient relates other than fatigue no symptomatology.  Patient also notes that there is no hematochezia or melena episodes since the initial day status post procedure.  She is awaiting upcoming appointments as noted below with GI and vascular surgery.    Objective:   Vitals:   01/29/24 1311  BP: 122/72  Pulse: (!) 103  SpO2: 95%  Height: 5\' 1"  (1.549 m)    Wt Readings from Last 3 Encounters:  01/20/24 170 lb (77.1 kg)  01/04/24 175 lb 12.8 oz (79.7 kg)  10/13/23 172 lb (78 kg)    Allergies as of 01/29/2024   No Known Allergies      Medication List        Accurate as of January 29, 2024  2:13 PM. If you have any questions, ask your nurse or doctor.           atorvastatin 20 MG tablet Commonly known as: LIPITOR Take 20 mg by mouth daily.   DULoxetine 30 MG capsule Commonly known as: CYMBALTA Take 30 mg by mouth daily.   fluticasone 50 MCG/ACT nasal spray Commonly known as: FLONASE Place 2 sprays into both nostrils daily. What changed:  when to take this reasons to take this   gabapentin 600 MG tablet Commonly known as: NEURONTIN Take 600 mg by mouth 2 (two)  times daily.   glucose blood test strip 1 each by Other route 2 (two) times daily.   hydrochlorothiazide 25 MG tablet Commonly known as: HYDRODIURIL Take 1 tablet (25 mg total) by mouth daily.   lisinopril 10 MG tablet Commonly known as: ZESTRIL Take 1 tablet (10 mg total) by mouth daily.   morphine 15 MG 12 hr tablet Commonly known as: MS CONTIN Take 15 mg by mouth every 8 (eight) hours as needed for pain.   Narcan 4 MG/0.1ML Liqd nasal spray kit Generic drug: naloxone Place 1 spray into the nose once.   nicotine 21 mg/24hr patch Commonly known as: NICODERM CQ - dosed in mg/24 hours Place 1 patch (21 mg total) onto the skin daily.   ondansetron 8 MG disintegrating tablet Commonly known as: ZOFRAN-ODT Take 1 tablet (8 mg total) by mouth every 8 (eight) hours as needed for nausea or vomiting.   onetouch ultrasoft lancets 1 each by Other route 2 (two) times daily.   Oxycodone HCl 10 MG Tabs Take 10 mg by mouth 4 (four) times daily as needed. Pain clinic   pantoprazole 40 MG tablet Commonly known as: Protonix Take 1 tablet (40 mg total) by mouth 2 (two) times daily.   PROBIOTIC DAILY PO Take 1 tablet by mouth daily.   Synjardy XR 12.04-999 MG Tb24 Generic drug: Empagliflozin-metFORMIN HCl ER Take 2 tablets by mouth daily. O'Connell   tiZANidine 4 MG tablet Commonly known as: ZANAFLEX Take 4 mg by mouth every 8 (eight) hours as needed for muscle spasms.         Physical Exam: Constitutional: Patient appears well-developed and well-nourished. Not  in obvious distress. HENT: Normocephalic, atraumatic, External right and left ear normal. Oropharynx is clear and moist.  Eyes: Conjunctivae and EOM are normal. PERRLA, no scleral icterus. Neck: Normal ROM. Neck supple. No JVD. No tracheal deviation. No thyromegaly. CVS: RRR, S1/S2 +, no murmurs, no gallops, no carotid bruit.  Pulmonary: Effort and breath sounds normal, no stridor, rhonchi, wheezes, rales.  Abdominal: Soft. BS +, no distension, tenderness, rebound or guarding.  Musculoskeletal: Normal range of motion. No edema and no tenderness.  Lymphadenopathy: No lymphadenopathy noted, cervical, inguinal or axillary Neuro: Alert. Normal reflexes, muscle tone coordination. No cranial nerve deficit. Skin: Skin is warm and dry. No rash noted. Not diaphoretic. No erythema. No pallor. Psychiatric: Normal mood and affect. Behavior, judgment, thought content normal.   Data Review   Micro Results No results found for this or any previous visit (from the past 240 hours).   CBC No results for input(s): "WBC", "HGB", "HCT", "PLT", "MCV", "MCH", "MCHC", "RDW", "LYMPHSABS", "MONOABS", "EOSABS", "BASOSABS", "BANDABS" in the last 168 hours.  Invalid input(s): "NEUTRABS", "BANDSABD"  Chemistries  No results for input(s): "NA", "K", "CL", "CO2", "GLUCOSE", "BUN", "CREATININE", "CALCIUM", "MG", "AST", "ALT", "ALKPHOS", "BILITOT" in the last 168 hours.  Invalid input(s): "GFRCGP" ------------------------------------------------------------------------------------------------------------------ estimated creatinine clearance is 69.4 mL/min (by C-G formula based on SCr of 0.74 mg/dL). ------------------------------------------------------------------------------------------------------------------ No results for input(s): "HGBA1C" in the last 72 hours. ------------------------------------------------------------------------------------------------------------------ No results for input(s): "CHOL",  "HDL", "LDLCALC", "TRIG", "CHOLHDL", "LDLDIRECT" in the last 72 hours. ------------------------------------------------------------------------------------------------------------------ No results for input(s): "TSH", "T4TOTAL", "T3FREE", "THYROIDAB" in the last 72 hours.  Invalid input(s): "FREET3" ------------------------------------------------------------------------------------------------------------------ No results for input(s): "VITAMINB12", "FOLATE", "FERRITIN", "TIBC", "IRON", "RETICCTPCT" in the last 72 hours.  Coagulation profile No results for input(s): "INR", "PROTIME" in the last 168 hours.  No results for input(s): "DDIMER" in the last 72 hours.  Cardiac Enzymes  No results for input(s): "CKMB", "TROPONINI", "MYOGLOBIN" in the last 168 hours.  Invalid input(s): "CK" ------------------------------------------------------------------------------------------------------------------ Invalid input(s): "POCBNP" Time spent with patient 45 minutes    Elizabeth Sauer M.D on 01/29/2024 at 2:13 PM   **Disclaimer: This note may have been dictated with voice recognition software. Similar sounding words can inadvertently be transcribed and this note may contain transcription errors which may not have been corrected upon publication of note.**   Assessment and Plan: 1. Hospital discharge follow-up (Primary)  Discharge Diagnoses: Principal Problem:   GI bleeding Active Problems:   Hematemesis with nausea   Duodenal ulcer hemorrhage   Resolved Problems: There has been resolution of the upper GI bleed with suspected stabilization of anemia which will be rechecked with CBC today.   Hospital Course: Chris Narasimhan is a 63 y.o. female with medical history significant of recent GI bleeding secondary to duodenal ulcer from NSAID's use, HTN, HLD, chronic pain syndrome, IIDM, presented with recurrent GI bleed after a EGD, injection hemostatic spray and hemostatic clips 2 days ago.   Patient is admitted to the hospitalist service for recurrent acute hematochezia, blood loss anemia. Seen by GI, who performed upper endoscopy showed normal esophagus, normal stomach, oozing duodenal ulcer pigmented material treated with argon plasma coagulation.  Patient did not have any more bleeding or dark stools.  Patient is hemodynamically stable to be discharged home.  Advised her to follow-up with PCP and GI as scheduled.  Patient and her daughter understands and agrees with the discharge plan.   Assessment and Plan: Recurrent acute hematochezia Acute blood loss anemia Likely secondary to recurrent duodenal ulcer bleeding/hemostatic clip dislodging CTA negative for active bleeding.  Clinically patient appears to be stable after arrival in the ED, no more hematochezia.  Since last night. GI performed EGD which did not show oozing duodenal ulcer (treated with APC. Repeat H&H remained stable.  She does not have sepsis hematochezia and is hemodynamically stable.   Hypertension Resume home blood pressure medications.   Chronic pain syndrome Continue home regimen of OxyContin 50 mg 3 times daily and oxycodone as needed   Type 2 diabetes mellitus Recent A1c 6.6 indicating good control Resume Synjardy.   Aneurysm right common iliac: CT scan revealed 18 mm multilobulated, saccular, irregular aneurysm of the right common iliac artery terminating just above the common iliac bifurcation. This is suspicious for a mycotic aneurysm, less likely a posttraumatic aneurysm.  I discussed with vascular surgery team who advised follow-up in 3 to 4 weeks for further management.   Obesity class I BMI 32.12. Diet can exercise and weight reduction advised.         Consultants: GI Procedures performed: EGD  Disposition: Home Diet recommendation:  Discharge Diet Orders (From admission, onward)        Start     Ordered    01/22/24 0000   Diet - low sodium heart healthy        01/22/24 1129     01/22/24 0000         All medications were reviewed with the patient and she has resumed her maintenance medications including her hypertension and diabetic medications.   2. Anemia due to GI blood loss Hemoglobin was in the mid 8 range and we will repeat a CBC with differential as well as a reticulocyte count.  Pending reticulocyte count will determine whether I will initiate ferrous sulfate.  Currently patient is having acid suppression with pantoprazole 40 mg 2 tablets a day and this  will be monitored and hopefully continued by gastroenterology in the future. - CBC with Differential/Platelet - Reticulocytes; normal  3. Essential hypertension Initially during the active bleed.  Patient had medications for blood pressure stopped and these have since been resumed with stabilization of blood pressure today at 122/72.  Patient is currently on hydrochlorothiazide 25 mg once a day and lisinopril 10 mg once a day. - Comprehensive metabolic panel  4. Chronic pain syndrome Patient is followed in pain clinic for chronic pain syndrome which I will defer to them for the continuance of this medication.  5. Aneurysm of right common iliac artery (HCC) Some discussion of whether this may become mycotic in nature and patient has an upcoming appointment with vascular surgery/Dr. Debroah Loop to determine whether further evaluation is necessary as well as therapeutic intervention.  6. Diabetes mellitus treated with oral medication (HCC) Chronic.  Controlled.  Stable.  Currently is controlled with Synjardy XR 12.04-999 mg 2 tablets daily and is followed by endocrinology and will defer to them for the continuance of this. - Comprehensive metabolic panel   Elizabeth Sauer, MD

## 2024-01-30 ENCOUNTER — Encounter: Payer: Self-pay | Admitting: Family Medicine

## 2024-01-30 LAB — CBC WITH DIFFERENTIAL/PLATELET
Basophils Absolute: 0.1 10*3/uL (ref 0.0–0.2)
Basos: 1 %
EOS (ABSOLUTE): 0.1 10*3/uL (ref 0.0–0.4)
Eos: 2 %
Hematocrit: 32.8 % — ABNORMAL LOW (ref 34.0–46.6)
Hemoglobin: 10.3 g/dL — ABNORMAL LOW (ref 11.1–15.9)
Immature Grans (Abs): 0 10*3/uL (ref 0.0–0.1)
Immature Granulocytes: 0 %
Lymphocytes Absolute: 1.4 10*3/uL (ref 0.7–3.1)
Lymphs: 25 %
MCH: 28.1 pg (ref 26.6–33.0)
MCHC: 31.4 g/dL — ABNORMAL LOW (ref 31.5–35.7)
MCV: 90 fL (ref 79–97)
Monocytes Absolute: 0.6 10*3/uL (ref 0.1–0.9)
Monocytes: 10 %
Neutrophils Absolute: 3.3 10*3/uL (ref 1.4–7.0)
Neutrophils: 62 %
Platelets: 334 10*3/uL (ref 150–450)
RBC: 3.66 x10E6/uL — ABNORMAL LOW (ref 3.77–5.28)
RDW: 14.1 % (ref 11.7–15.4)
WBC: 5.5 10*3/uL (ref 3.4–10.8)

## 2024-01-30 LAB — COMPREHENSIVE METABOLIC PANEL
ALT: 5 [IU]/L (ref 0–32)
AST: 7 [IU]/L (ref 0–40)
Albumin: 3.8 g/dL — ABNORMAL LOW (ref 3.9–4.9)
Alkaline Phosphatase: 49 [IU]/L (ref 44–121)
BUN/Creatinine Ratio: 10 — ABNORMAL LOW (ref 12–28)
BUN: 7 mg/dL — ABNORMAL LOW (ref 8–27)
Bilirubin Total: <0.2 mg/dL (ref 0.0–1.2)
CO2: 25 mmol/L (ref 20–29)
Calcium: 9 mg/dL (ref 8.7–10.3)
Chloride: 103 mmol/L (ref 96–106)
Creatinine, Ser: 0.71 mg/dL (ref 0.57–1.00)
Globulin, Total: 2 g/dL (ref 1.5–4.5)
Glucose: 97 mg/dL (ref 70–99)
Potassium: 4.5 mmol/L (ref 3.5–5.2)
Sodium: 141 mmol/L (ref 134–144)
Total Protein: 5.8 g/dL — ABNORMAL LOW (ref 6.0–8.5)
eGFR: 97 mL/min/{1.73_m2} (ref >59)

## 2024-01-30 LAB — RETICULOCYTES: Retic Ct Pct: 2.7 % — ABNORMAL HIGH (ref 0.6–2.6)

## 2024-02-02 LAB — BPAM RBC
Blood Product Expiration Date: 202503012359
Blood Product Expiration Date: 202503022359
Blood Product Expiration Date: 202503082359
ISSUE DATE / TIME: 202502052204
ISSUE DATE / TIME: 202502060123
ISSUE DATE / TIME: 202502082239
Unit Type and Rh: 5100
Unit Type and Rh: 5100
Unit Type and Rh: 6200

## 2024-02-02 LAB — TYPE AND SCREEN
ABO/RH(D): A POS
Antibody Screen: NEGATIVE
Unit division: 0
Unit division: 0
Unit division: 0

## 2024-02-15 ENCOUNTER — Ambulatory Visit (INDEPENDENT_AMBULATORY_CARE_PROVIDER_SITE_OTHER): Payer: Self-pay | Admitting: Nurse Practitioner

## 2024-02-15 ENCOUNTER — Encounter (INDEPENDENT_AMBULATORY_CARE_PROVIDER_SITE_OTHER): Payer: Self-pay | Admitting: Nurse Practitioner

## 2024-02-15 VITALS — BP 114/74 | HR 94 | Resp 16 | Wt 182.4 lb

## 2024-02-15 DIAGNOSIS — I723 Aneurysm of iliac artery: Secondary | ICD-10-CM | POA: Diagnosis not present

## 2024-02-15 DIAGNOSIS — I10 Essential (primary) hypertension: Secondary | ICD-10-CM

## 2024-02-15 DIAGNOSIS — F17200 Nicotine dependence, unspecified, uncomplicated: Secondary | ICD-10-CM | POA: Diagnosis not present

## 2024-02-15 NOTE — Progress Notes (Signed)
 Subjective:    Patient ID: Erica Ruiz, female    DOB: Jun 06, 1961, 63 y.o.   MRN: [id number] Chief Complaint  Patient presents with   Follow-up    [facility] 3 week follow up    Erica Ruiz is a 63 year old female who presents today after incidental finding of a right common iliac artery aneurysm.  She initially presented to the hospital with hematochezia.  Following her hospitalization she has not had any further issues.  However during her hospitalization it was found that she had a 1.8 cm common iliac artery aneurysm.  Initially it was felt to be that it is possibly mycotic in nature however the patient has not had any resolving infection such as sepsis.  She was septic about 15 to 20 years ago.  She denies any abdominal pain.  No signs symptoms of distal embolization.  Overall she is doing fairly well post hospitalization.    Review of Systems  Gastrointestinal:  Negative for blood in stool.  All other systems reviewed and are negative.      Objective:   Physical Exam Vitals reviewed.  HENT:     Head: Normocephalic.  Cardiovascular:     Rate and Rhythm: Normal rate.  Pulmonary:     Effort: Pulmonary effort is normal.  Skin:    General: Skin is warm and dry.  Neurological:     Mental Status: She is alert and oriented to person, place, and time.  Psychiatric:        Mood and Affect: Mood normal.        Behavior: Behavior normal.        Thought Content: Thought content normal.        Judgment: Judgment normal.    BP 114/74   Pulse 94   Resp 16   Wt 182 lb 6.4 oz (82.7 kg)   BMI 34.46 kg/m   Past Medical History:  Diagnosis Date   Arthritis unsure   Back pain    Diabetes mellitus without complication (HCC)    GERD (gastroesophageal reflux disease) unsure   Hypertension     Social History   Socioeconomic History   Marital status: Married    Spouse name: Bobbye Riggs   Number of children: 4   Years of education: Not on file   Highest education level:  Some college, no degree  Occupational History   Occupation: Disabled  Tobacco Use   Smoking status: Every Day    Current packs/day: 0.50    Average packs/day: 0.5 packs/day for 30.2 years (15.1 ttl pk-yrs)    Types: Cigarettes    Start date: 1995   Smokeless tobacco: Never  Vaping Use   Vaping status: Never Used  Substance and Sexual Activity   Alcohol use: Never   Drug use: Never   Sexual activity: Yes    Birth control/protection: Post-menopausal  Other Topics Concern   Not on file  Social History Narrative   ** Merged History Encounter **       Social Drivers of Health   Financial Resource Strain: Low Risk  (10/08/2023)   Overall Financial Resource Strain (CARDIA)    Difficulty of Paying Living Expenses: Not hard at all  Food Insecurity: No Food Insecurity (01/21/2024)   Hunger Vital Sign    Worried About Running Out of Food in the Last Year: Never true    Ran Out of Food in the Last Year: Never true  Transportation Needs: No Transportation Needs (01/21/2024)   PRAPARE - Transportation  Lack of Transportation (Medical): No    Lack of Transportation (Non-Medical): No  Physical Activity: Inactive (10/08/2023)   Exercise Vital Sign    Days of Exercise per Week: 0 days    Minutes of Exercise per Session: 20 min  Stress: No Stress Concern Present (10/08/2023)   [employer] of Occupational Health - Occupational Stress Questionnaire    Feeling of Stress : Not at all  Social Connections: Unknown (01/18/2024)   Social Connection and Isolation Panel [NHANES]    Frequency of Communication with Friends and Family: More than three times a week    Frequency of Social Gatherings with Friends and Family: More than three times a week    Attends Religious Services: Patient declined    Database administrator or Organizations: No    Attends Banker Meetings: Never    Marital Status: Married  Catering manager Violence: Not At Risk (01/21/2024)   Humiliation, Afraid,  Rape, and Kick questionnaire    Fear of Current or Ex-Partner: No    Emotionally Abused: No    Physically Abused: No    Sexually Abused: No    Past Surgical History:  Procedure Laterality Date   BREAST BIOPSY Right    neg bx/clip   CHOLECYSTECTOMY  unsure   COLON SURGERY  unsure   COLONOSCOPY WITH PROPOFOL N/A 08/23/2018   Procedure: COLONOSCOPY WITH PROPOFOL with biopsies;  Surgeon: Midge Minium, MD;  Location: [facility];  Service: Endoscopy;  Laterality: N/A;  DIABETIC   COLONOSCOPY WITH PROPOFOL N/A 10/13/2023   Procedure: COLONOSCOPY WITH BIOPSY;  Surgeon: Midge Minium, MD;  Location: [facility];  Service: Endoscopy;  Laterality: N/A;  Diabetic   COLOSTOMY REVERSAL     ESOPHAGOGASTRODUODENOSCOPY (EGD) WITH PROPOFOL N/A 01/17/2024   Procedure: ESOPHAGOGASTRODUODENOSCOPY (EGD) WITH PROPOFOL;  Surgeon: Regis Bill, MD;  Location: [facility] ENDOSCOPY;  Service: Endoscopy;  Laterality: N/A;   ESOPHAGOGASTRODUODENOSCOPY (EGD) WITH PROPOFOL N/A 01/21/2024   Procedure: ESOPHAGOGASTRODUODENOSCOPY (EGD) WITH PROPOFOL;  Surgeon: Midge Minium, MD;  Location: [facility] END[facility]COPY;  Service: Endoscopy;  Laterality: N/A;   HEMOSTASIS CLIP PLACEMENT  01/17/2024   Procedure: HEMOSTASIS CLIP PLACEMENT;  Surgeon: Regis Bill, MD;  Location: [facility] ENDOSCOPY;  Service: Endoscopy;;   HEMOSTASIS CONTROL  01/17/2024   Procedure: HEMOSTASIS CONTROL;  Surgeon: Regis Bill, MD;  Location: [facility] END[facility]COPY;  Service: Endoscopy;;   HOT HEMOSTASIS  01/21/2024   Procedure: HOT HEMOSTASIS (ARGON PLASMA COAGULATION/BICAP);  Surgeon: Midge Minium, MD;  Location: [facility];  Service: Endoscopy;;   KNEE SURGERY     POLYPECTOMY N/A 08/23/2018   Procedure: POLYPECTOMY;  Surgeon: Midge Minium, MD;  Location: [facility];  Service: Endoscopy;  Laterality: N/A;   POLYPECTOMY  10/13/2023   Procedure: POLYPECTOMY;  Surgeon: Midge Minium, MD;  Location: [facility];   Service: Endoscopy;;   SUBMUCOSAL INJECTION  01/17/2024   Procedure: SUBMUCOSAL INJECTION;  Surgeon: Regis Bill, MD;  Location: [facility] ENDOSCOPY;  Service: Endoscopy;;   TONSILLECTOMY     TUBAL LIGATION  1991    Family History  Problem Relation Age of Onset   Pulmonary embolism Mother    Early death Mother    Obesity Mother    Cancer Father        prostate   Heart disease Father    Hypertension Father    Alcohol abuse Father    COPD Father    Hearing loss Father    Cancer Sister  non-hodgkin's lymphoma   Hepatitis C Sister    Cancer Brother        colon   Diabetes Brother    Heart disease Brother    Hypertension Brother    Diabetes Daughter    Diabetes Brother    Hypertension Sister    Cancer Brother        prostate   Breast cancer Paternal Aunt    Cancer Sister    Early death Sister    Obesity Sister    Cancer Brother    Diabetes Brother    Early death Brother    Cancer Brother    Obesity Brother    COPD Sister    Hypertension Sister    Obesity Sister    Diabetes Brother     No Known Allergies     Latest Ref Rng & Units 01/29/2024    2:19 PM 01/22/2024    4:30 AM 01/21/2024   10:15 PM  CBC  WBC 3.4 - 10.8 x10E3/uL 5.5  5.7    Hemoglobin 11.1 - 15.9 g/dL 60.4  8.2  8.6   Hematocrit 34.0 - 46.6 % 32.8  24.3  25.8   Platelets 150 - 450 x10E3/uL 334  207        CMP     Component Value Date/Time   NA 141 01/29/2024 1419   K 4.5 01/29/2024 1419   CL 103 01/29/2024 1419   CO2 25 01/29/2024 1419   GLUCOSE 97 01/29/2024 1419   GLUCOSE 86 01/21/2024 0537   BUN 7 (L) 01/29/2024 1419   CREATININE 0.71 01/29/2024 1419   CALCIUM 9.0 01/29/2024 1419   PROT 5.8 (L) 01/29/2024 1419   ALBUMIN 3.8 (L) 01/29/2024 1419   AST 7 01/29/2024 1419   ALT 5 01/29/2024 1419   ALKPHOS 49 01/29/2024 1419   BILITOT <0.2 01/29/2024 1419   EGFR 97 01/29/2024 1419   GFRNONAA >60 01/21/2024 0537     No results found.     Assessment & Plan:   1. 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  This is below the typical threshold of 3 cm for repair.  We discussed the typical endovascular method of repair.  We discussed that the best intervention for this is to control her blood pressure, which looks very good today.  In addition she is recommended to stop smoking.  Will plan on having the patient return in 6 months with noninvasive studies.  2. Essential hypertension Continue antihypertensive medications as already ordered, these medications have been reviewed and there are no changes at this time.  3. Tobacco dependence Smoking cessation was discussed, 3-10 minutes spent on this topic specifically   Current Outpatient Medications on File Prior to Visit  Medication Sig Dispense Refill   atorvastatin (LIPITOR) 20 MG tablet Take 20 mg by mouth daily.     DULoxetine (CYMBALTA) 30 MG capsule Take 30 mg by mouth daily.  3   fluticasone (FLONASE) 50 MCG/ACT nasal spray Place 2 sprays into both nostrils daily. (Patient taking differently: Place 2 sprays into both nostrils daily as needed for rhinitis or allergies.) 16 g 6   gabapentin (NEURONTIN) 600 MG tablet Take 600 mg by mouth 2 (two) times daily.  3   glucose blood test strip 1 each by Other route 2 (two) times daily. 100 each 0   hydrochlorothiazide (HYDRODIURIL) 25 MG tablet Take 1 tablet (25 mg total) by  mouth daily. 90 tablet 1   Lancets (ONETOUCH ULTRASOFT) lancets 1 each by Other route 2 (two) times daily. 100 each 0   lisinopril (ZESTRIL) 10 MG tablet Take 1 tablet (10 mg total) by mouth daily. 90 tablet 1   morphine (MS CONTIN) 15 MG 12 hr tablet Take 15 mg by mouth every 8 (eight) hours as needed for pain.  0   NARCAN 4 MG/0.1ML LIQD nasal spray kit Place 1 spray into the nose once.     nicotine (NICODERM CQ - DOSED IN MG/24 HOURS) 21 mg/24hr patch Place 1 patch (21 mg total) onto the skin daily. 30 patch 1    ondansetron (ZOFRAN-ODT) 8 MG disintegrating tablet Take 1 tablet (8 mg total) by mouth every 8 (eight) hours as needed for nausea or vomiting. 20 tablet 0   Oxycodone HCl 10 MG TABS Take 10 mg by mouth 4 (four) times daily as needed. Pain clinic     pantoprazole (PROTONIX) 40 MG tablet Take 1 tablet (40 mg total) by mouth 2 (two) times daily. 60 tablet 1   Probiotic Product (PROBIOTIC DAILY PO) Take 1 tablet by mouth daily.     SYNJARDY XR 12.04-999 MG TB24 Take 2 tablets by mouth daily. O'Connell     tiZANidine (ZANAFLEX) 4 MG tablet Take 4 mg by mouth every 8 (eight) hours as needed for muscle spasms.  3   No current facility-administered medications on file prior to visit.    There are no Patient Instructions on file for this visit. No follow-ups on file.   Georgiana Spinner, NP

## 2024-02-17 ENCOUNTER — Ambulatory Visit: Payer: PPO | Admitting: Physician Assistant

## 2024-02-18 DIAGNOSIS — M4716 Other spondylosis with myelopathy, lumbar region: Secondary | ICD-10-CM | POA: Diagnosis not present

## 2024-02-18 DIAGNOSIS — M25561 Pain in right knee: Secondary | ICD-10-CM | POA: Diagnosis not present

## 2024-02-18 DIAGNOSIS — G894 Chronic pain syndrome: Secondary | ICD-10-CM | POA: Diagnosis not present

## 2024-02-18 DIAGNOSIS — M25562 Pain in left knee: Secondary | ICD-10-CM | POA: Diagnosis not present

## 2024-02-18 DIAGNOSIS — Z79899 Other long term (current) drug therapy: Secondary | ICD-10-CM | POA: Diagnosis not present

## 2024-02-18 DIAGNOSIS — M5116 Intervertebral disc disorders with radiculopathy, lumbar region: Secondary | ICD-10-CM | POA: Diagnosis not present

## 2024-03-17 ENCOUNTER — Ambulatory Visit (INDEPENDENT_AMBULATORY_CARE_PROVIDER_SITE_OTHER): Payer: Self-pay | Admitting: Family Medicine

## 2024-03-17 ENCOUNTER — Encounter: Payer: Self-pay | Admitting: Family Medicine

## 2024-03-17 VITALS — BP 120/58 | HR 100 | Ht 61.0 in

## 2024-03-17 DIAGNOSIS — I10 Essential (primary) hypertension: Secondary | ICD-10-CM

## 2024-03-17 MED ORDER — LISINOPRIL 10 MG PO TABS: 10.0000 mg | ORAL_TABLET | Freq: Every day | ORAL | 1 refills | Status: DC

## 2024-03-17 MED ORDER — HYDROCHLOROTHIAZIDE 25 MG PO TABS: 25.0000 mg | ORAL_TABLET | Freq: Every day | ORAL | 1 refills | Status: DC

## 2024-03-17 NOTE — Progress Notes (Signed)
 Date:  03/17/2024   Name:  Erica Ruiz   DOB:  08/10/1961   MRN:  [id number]   Chief Complaint: Hypertension (Patient presents today for a follow up on her HTN. She has been doing well and taking her medications as directed. )  Hypertension This is a chronic problem. The current episode started more than 1 year ago. The problem has been gradually improving since onset. The problem is controlled. Associated symptoms include peripheral edema. Pertinent negatives include no blurred vision, chest pain, headaches, orthopnea, palpitations, PND, shortness of breath or sweats. There are no associated agents to hypertension. Past treatments include diuretics and ACE inhibitors. The current treatment provides moderate improvement. There are no compliance problems.  There is no history of CAD/MI or CVA. There is no history of chronic renal disease, a hypertension causing med or renovascular disease.    Lab Results  Component Value Date   NA 141 01/29/2024   K 4.5 01/29/2024   CO2 25 01/29/2024   GLUCOSE 97 01/29/2024   BUN 7 (L) 01/29/2024   CREATININE 0.71 01/29/2024   CALCIUM 9.0 01/29/2024   EGFR 97 01/29/2024   GFRNONAA >60 01/21/2024   Lab Results  Component Value Date   CHOL 165 04/13/2023   HDL 54 04/13/2023   LDLCALC 92 04/13/2023   LDLDIRECT 97 10/10/2022   TRIG 107 04/13/2023   CHOLHDL 3.3 07/15/2021   Lab Results  Component Value Date   TSH 1.070 10/10/2022   Lab Results  Component Value Date   HGBA1C 6.6 (H) 01/17/2024   Lab Results  Component Value Date   WBC 5.5 01/29/2024   HGB 10.3 (L) 01/29/2024   HCT 32.8 (L) 01/29/2024   MCV 90 01/29/2024   PLT 334 01/29/2024   Lab Results  Component Value Date   ALT 5 01/29/2024   AST 7 01/29/2024   ALKPHOS 49 01/29/2024   BILITOT <0.2 01/29/2024   No results found for: "25OHVITD2", "25OHVITD3", "VD25OH"   Review of Systems  Eyes:  Negative for blurred vision.  Respiratory:  Negative for cough, chest  tightness, shortness of breath and wheezing.   Cardiovascular:  Negative for chest pain, palpitations, orthopnea, leg swelling and PND.  Gastrointestinal:  Negative for abdominal pain and blood in stool.  Endocrine: Negative for polydipsia and polyuria.  Genitourinary:  Negative for hematuria and vaginal bleeding.  Neurological:  Negative for headaches.    Patient Active Problem List   Diagnosis Date Noted   Aneurysm of right common iliac artery (HCC) 01/23/2024   GI bleeding 01/21/2024   Hematemesis with nausea 01/21/2024   Duodenal ulcer hemorrhage 01/21/2024   Acute upper GI bleed 01/16/2024   Symptomatic anemia 01/16/2024   Hypotension 01/16/2024   Lower extremity edema 01/16/2024   Chronic pain syndrome 01/16/2024   History of colonic polyps 10/13/2023   Polyp of transverse colon 10/13/2023   Type 2 diabetes mellitus with hyperglycemia, without long-term current use of insulin (HCC) 09/17/2018   Tobacco dependence 09/17/2018   Special screening for malignant neoplasms, colon    Benign neoplasm of transverse colon    Polyp of sigmoid colon    Rectal polyp    Hyperglycemia 04/16/2018   Essential hypertension 04/16/2018   Venous insufficiency 04/16/2018   Abnormal weight gain 04/16/2018    No Known Allergies  Past Surgical History:  Procedure Laterality Date   BREAST BIOPSY Right    neg bx/clip   CHOLECYSTECTOMY  unsure   COLON SURGERY  unsure  COLONOSCOPY WITH PROPOFOL N/A 08/23/2018   Procedure: COLONOSCOPY WITH PROPOFOL with biopsies;  Surgeon: Midge Minium, MD;  Location: [facility];  Service: Endoscopy;  Laterality: N/A;  DIABETIC   COLONOSCOPY WITH PROPOFOL N/A 10/13/2023   Procedure: COLONOSCOPY WITH BIOPSY;  Surgeon: Midge Minium, MD;  Location: [facility];  Service: Endoscopy;  Laterality: N/A;  Diabetic   COLOSTOMY REVERSAL     ESOPHAGOGASTRODUODENOSCOPY (EGD) WITH PROPOFOL N/A 01/17/2024   Procedure: ESOPHAGOGASTRODUODENOSCOPY (EGD) WITH  PROPOFOL;  Surgeon: Regis Bill, MD;  Location: [facility] ENDOSCOPY;  Service: Endoscopy;  Laterality: N/A;   ESOPHAGOGASTRODUODENOSCOPY (EGD) WITH PROPOFOL N/A 01/21/2024   Procedure: ESOPHAGOGASTRODUODENOSCOPY (EGD) WITH PROPOFOL;  Surgeon: Midge Minium, MD;  Location: [facility] ENDOSCOPY;  Service: Endoscopy;  Laterality: N/A;   HEMOSTASIS CLIP PLACEMENT  01/17/2024   Procedure: HEMOSTASIS CLIP PLACEMENT;  Surgeon: Regis Bill, MD;  Location: [facility] ENDOSCOPY;  Service: Endoscopy;;   HEMOSTASIS CONTROL  01/17/2024   Procedure: HEMOSTASIS CONTROL;  Surgeon: Regis Bill, MD;  Location: [facility] ENDOSCOPY;  Service: Endoscopy;;   HOT HEMOSTASIS  01/21/2024   Procedure: HOT HEMOSTASIS (ARGON PLASMA COAGULATION/BICAP);  Surgeon: Midge Minium, MD;  Location: [facility] END[facility]COPY;  Service: Endoscopy;;   KNEE SURGERY     POLYPECTOMY N/A 08/23/2018   Procedure: POLYPECTOMY;  Surgeon: Midge Minium, MD;  Location: [facility];  Service: Endoscopy;  Laterality: N/A;   POLYPECTOMY  10/13/2023   Procedure: POLYPECTOMY;  Surgeon: Midge Minium, MD;  Location: [facility];  Service: Endoscopy;;   SUBMUCOSAL INJECTION  01/17/2024   Procedure: SUBMUCOSAL INJECTION;  Surgeon: Regis Bill, MD;  Location: [facility] END[facility]COPY;  Service: Endoscopy;;   TONSILLECTOMY     TUBAL LIGATION  1991    Social History   Tobacco Use   Smoking status: Every Day    Current packs/day: 0.50    Average packs/day: 0.5 packs/day for 30.3 years (15.1 ttl pk-yrs)    Types: Cigarettes    Start date: 1995   Smokeless tobacco: Never  Vaping Use   Vaping status: Never Used  Substance Use Topics   Alcohol use: Never   Drug use: Never     Medication list has been reviewed and updated.  Current Meds  Medication Sig   atorvastatin (LIPITOR) 20 MG tablet Take 20 mg by mouth daily.   DULoxetine (CYMBALTA) 30 MG capsule Take 30 mg by mouth daily.   fluticasone (FLONASE) 50 MCG/ACT nasal spray Place 2  sprays into both nostrils daily. (Patient taking differently: Place 2 sprays into both nostrils daily as needed for rhinitis or allergies.)   gabapentin (NEURONTIN) 600 MG tablet Take 600 mg by mouth 2 (two) times daily.   glucose blood test strip 1 each by Other route 2 (two) times daily.   hydrochlorothiazide (HYDRODIURIL) 25 MG tablet Take 1 tablet (25 mg total) by mouth daily.   Lancets (ONETOUCH ULTRASOFT) lancets 1 each by Other route 2 (two) times daily.   lisinopril (ZESTRIL) 10 MG tablet Take 1 tablet (10 mg total) by mouth daily.   morphine (MS CONTIN) 15 MG 12 hr tablet Take 15 mg by mouth every 8 (eight) hours as needed for pain.   NARCAN 4 MG/0.1ML LIQD nasal spray kit Place 1 spray into the nose once.   Oxycodone HCl 10 MG TABS Take 10 mg by mouth 4 (four) times daily as needed. Pain clinic   Probiotic Product (PROBIOTIC DAILY PO) Take 1 tablet by mouth daily.   SYNJARDY XR 12.04-999 MG TB24 Take  2 tablets by mouth daily. O'Connell   tiZANidine (ZANAFLEX) 4 MG tablet Take 4 mg by mouth every 8 (eight) hours as needed for muscle spasms.       03/17/2024    1:22 PM 10/08/2023    3:41 PM 04/13/2023    2:02 PM 10/10/2022    2:07 PM  GAD 7 : Generalized Anxiety Score  Nervous, Anxious, on Edge 0 0  0  Control/stop worrying 0 0 0 0  Worry too much - different things 0 0 0 0  Trouble relaxing 0 0 0 0  Restless 0 0 0 0  Easily annoyed or irritable 0 0 0 0  Afraid - awful might happen 0 0 0 0  Total GAD 7 Score 0 0  0  Anxiety Difficulty Not difficult at all Not difficult at all Not difficult at all Not difficult at all       03/17/2024    1:22 PM 10/08/2023    3:41 PM 09/22/2023    2:34 PM  Depression screen PHQ 2/9  Decreased Interest 0 0 0  Down, Depressed, Hopeless 0 0 0  PHQ - 2 Score 0 0 0  Altered sleeping 0 2 0  Tired, decreased energy 0 1 0  Change in appetite 0 0 0  Feeling bad or failure about yourself  0 0 0  Trouble concentrating 0 0 0  Moving slowly or  fidgety/restless 0 0 0  Suicidal thoughts 0 0 0  PHQ-9 Score 0 3 0  Difficult doing work/chores Not difficult at all Not difficult at all Not difficult at all    BP Readings from Last 3 Encounters:  03/17/24 (!) 120/58  02/15/24 114/74  01/29/24 122/72    Physical Exam Vitals and nursing note reviewed.  Constitutional:      General: She is not in acute distress.    Appearance: She is not diaphoretic.  HENT:     Head: Normocephalic and atraumatic.     Right Ear: External ear normal.     Left Ear: External ear normal.     Nose: Nose normal. No congestion or rhinorrhea.     Mouth/Throat:     Mouth: Mucous membranes are moist.  Eyes:     General:        Right eye: No discharge.        Left eye: No discharge.     Conjunctiva/sclera: Conjunctivae normal.     Pupils: Pupils are equal, round, and reactive to light.  Neck:     Thyroid: No thyromegaly.     Vascular: No JVD.  Cardiovascular:     Rate and Rhythm: Normal rate and regular rhythm.     Heart sounds: Normal heart sounds, S1 normal and S2 normal. No murmur heard.    No systolic murmur is present.     No diastolic murmur is present.     No friction rub. No gallop.  Pulmonary:     Effort: Pulmonary effort is normal.     Breath sounds: Normal breath sounds. No decreased breath sounds, wheezing, rhonchi or rales.  Abdominal:     General: Bowel sounds are normal.     Palpations: Abdomen is soft. There is no mass.     Tenderness: There is no abdominal tenderness. There is no guarding or rebound.  Musculoskeletal:        General: Normal range of motion.     Cervical back: Normal range of motion and neck supple.  Lymphadenopathy:  Cervical: No cervical adenopathy.  Skin:    General: Skin is warm and dry.  Neurological:     Mental Status: She is alert.     Deep Tendon Reflexes: Reflexes are normal and symmetric.     Reflex Scores:      Patellar reflexes are 2+ on the right side and 2+ on the left side.    Wt  Readings from Last 3 Encounters:  02/15/24 182 lb 6.4 oz (82.7 kg)  01/20/24 170 lb (77.1 kg)  01/04/24 175 lb 12.8 oz (79.7 kg)    BP (!) 120/58   Pulse 100   Ht 5\' 1"  (1.549 m)   SpO2 95%   BMI 34.46 kg/m   Assessment and Plan: 1. Essential hypertension (Primary) Chronic.  Controlled.  Stable.  Blood pressure today 120/58.  Asymptomatic.  Tolerating medication well.  Continue hydrochlorothiazide 25 mg once a day and lisinopril 10 mg once a day.  Review of previous renal function panel is acceptable for electrolytes and GFR.  Will recheck patient in 6 months and repeat labs at that time - hydrochlorothiazide (HYDRODIURIL) 25 MG tablet; Take 1 tablet (25 mg total) by mouth daily.  Dispense: 90 tablet; Refill: 1 - lisinopril (ZESTRIL) 10 MG tablet; Take 1 tablet (10 mg total) by mouth daily.  Dispense: 90 tablet; Refill: 1     Elizabeth Sauer, MD

## 2024-04-14 DIAGNOSIS — G894 Chronic pain syndrome: Secondary | ICD-10-CM | POA: Diagnosis not present

## 2024-04-14 DIAGNOSIS — M25562 Pain in left knee: Secondary | ICD-10-CM | POA: Diagnosis not present

## 2024-04-14 DIAGNOSIS — Z79899 Other long term (current) drug therapy: Secondary | ICD-10-CM | POA: Diagnosis not present

## 2024-04-14 DIAGNOSIS — M4716 Other spondylosis with myelopathy, lumbar region: Secondary | ICD-10-CM | POA: Diagnosis not present

## 2024-04-14 DIAGNOSIS — M5116 Intervertebral disc disorders with radiculopathy, lumbar region: Secondary | ICD-10-CM | POA: Diagnosis not present

## 2024-04-14 DIAGNOSIS — M25561 Pain in right knee: Secondary | ICD-10-CM | POA: Diagnosis not present

## 2024-04-26 DIAGNOSIS — E785 Hyperlipidemia, unspecified: Secondary | ICD-10-CM | POA: Diagnosis not present

## 2024-04-26 DIAGNOSIS — I152 Hypertension secondary to endocrine disorders: Secondary | ICD-10-CM | POA: Diagnosis not present

## 2024-04-26 DIAGNOSIS — E1169 Type 2 diabetes mellitus with other specified complication: Secondary | ICD-10-CM | POA: Diagnosis not present

## 2024-04-26 DIAGNOSIS — E1129 Type 2 diabetes mellitus with other diabetic kidney complication: Secondary | ICD-10-CM | POA: Diagnosis not present

## 2024-04-26 DIAGNOSIS — E1159 Type 2 diabetes mellitus with other circulatory complications: Secondary | ICD-10-CM | POA: Diagnosis not present

## 2024-04-26 DIAGNOSIS — R809 Proteinuria, unspecified: Secondary | ICD-10-CM | POA: Diagnosis not present

## 2024-04-26 LAB — PROTEIN / CREATININE RATIO, URINE: Albumin, U: 174.7

## 2024-04-26 LAB — MICROALBUMIN, URINE: Microalb, Ur: 19

## 2024-04-26 LAB — HM DIABETES FOOT EXAM: HM Diabetic Foot Exam: NORMAL

## 2024-04-26 LAB — HEMOGLOBIN A1C: Hemoglobin A1C: 5.8

## 2024-04-26 LAB — MICROALBUMIN / CREATININE URINE RATIO: Microalb Creat Ratio: <30

## 2024-04-26 NOTE — Progress Notes (Addendum)
 HPI: Erica Ruiz is a 63 y.o. female who presents for follow up diabetes.  She was diagnosed with diabetes in 2019.  She lost 80 pounds originally and has been stable since.  I last saw her 11/24.  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  She says she has quit smoking.   She is currently on Synjardy ER 12.04/999, 2 daily. She tests her sugar twice daily.  Her meter was not able to be downloaded today.  I did review the meter memory and she is mostly in the low 100s, and is often below 100 Review of her diet shows that she avoids concentrated carbs.  She is up 5 pounds since last visit.  She walks her dogs but is limited by her knees and back for exercise and is on gabapentin  for it.  She denies numbness/tingling/burning in her feet.  She is concerned about her diabetes.     ROS:  No chest pain.  No shortness of breath.   Medical History: Past Medical History:  Diagnosis Date  . Chronic back pain   . Hypertension   . Type 2 diabetes mellitus (CMS/HHS-HCC)     Surgical History: Past Surgical History:  Procedure Laterality Date  . EGD @ [facility]  01/17/2024   Inpatient EGD. No path. Will likely need f/u in 3 months or so/CTL  . colon reconnect    . LAPAROSCOPIC CHOLECYSTECTOMY    . LAPAROSCOPIC TUBAL LIGATION    . right knee surgery    . TONSILLECTOMY      Social History:  reports that she has been smoking cigarettes. She has a 12.5 pack-year smoking history. She has never used smokeless tobacco. She reports that she does not currently use alcohol. She reports that she does not use drugs.  Family History: family history includes Colon cancer in her brother; Diabetes type II in her brother, brother, and daughter; High blood pressure (Hypertension) in her brother, brother, father, sister, and sister; Lymphoma in her sister; Myocardial Infarction (Heart attack) in her brother, brother, and father; Prostate cancer in her father.  Medications: Current  Outpatient Medications  Medication Sig Dispense Refill  . atorvastatin  (LIPITOR) 20 MG tablet TAKE 1 TABLET BY MOUTH ONCE EVERY EVENING 90 tablet 4  . DULoxetine  (CYMBALTA ) 30 MG DR capsule Take 30 mg by mouth once daily    . empagliflozin -metFORMIN  (SYNJARDY XR) 12.5-1,000 mg XR 24 hr biphasic tablet Take 2 tablets by mouth daily with breakfast 180 tablet 4  . gabapentin  (NEURONTIN ) 600 MG tablet Take 600 mg by mouth 2 (two) times daily    . hydroCHLOROthiazide  (HYDRODIURIL ) 25 MG tablet Take 25 mg by mouth once daily    . lisinopril  (PRINIVIL ,ZESTRIL ) 10 MG tablet Take 10 mg by mouth once daily    . morphine  (MS CONTIN ) 15 MG ER tablet Take 15 mg by mouth every 8 (eight) hours as needed for Pain    . naloxone  (NARCAN ) 4 mg/actuation nasal spray Place 4 mg into one nostril once    . ONETOUCH ULTRA TEST test strip USE 1 STRIP 3 TIMES DAILY AS DIRECTED 100 each 11  . oxyCODONE -acetaminophen  (PERCOCET) 7.5-325 mg tablet Take 1 tablet by mouth every 8 (eight) hours as needed for Pain    . tiZANidine  (ZANAFLEX ) 4 MG tablet Take 4 mg by mouth every 8 (eight) hours as needed    . lancets Use 1 each 3 (three) times daily for 30 days Use as instructed. 100 each  5   No current facility-administered medications for this visit.    Allergies: No Known Allergies  Physical Exam: Vitals:   04/26/24 1520  BP: 120/70  Pulse: 86  SpO2: 97%  Weight: 84.1 kg (185 lb 6.4 oz)  Height: 154.9 cm (5' 1)   Body mass index is 35.03 kg/m. Gen: WDWN in NAD   Foot Exam:  Bilateral feet were examined with socks and shoes removed.  Her 10g filament is normal bilaterally.  She has no ulcers.  Distal pulses intact.  Physical exam otherwise deferred due to coronavirus precautions.   Labs: 09/08/2018:  A1c = 6.7  12/22/2019:  A1c = 6.4 06/21/2020:  A1c = 6.5.  MA = 36.6.  Chol = 224/178/43.4/145.  01/03/2021:  A1c = 6.2 04/08/2021:  A1c = 6.2.  K/Cr/Ca = 4.5/0.7/9.1.  LFTs nl.  07/15/2021:  Chol =  169/121/52/95 10/07/2021:  A1c = 6.4.  04/09/2022:  A1c = 6.6 10/10/2022:  A1c = 5.6.  K/Cr/Ca = 4.6/0.82/9.1.  MA = 10.  LDL = 97.  LFTs nl.  TSH = 1.07.   04/13/2023:  K/Cr/Ca = 4.5/0.88/9.4.  Chol = 165/107/54/92.  LFTs nl.  04/22/2023:   A1c = 6.6.  B12=509. 10/30/2023:  A1c = 5.9  01/17/2024:  A1c= 6.6.  01/29/2024:  K/Cr/Ca= 4.5/0.71/9.   04/26/2024:  A1c= 5.8.   Assessment/Plan: 1.  Diabetes.  Her A1c today is 5.8.  Based on review of her meter memory, I will make no changes to her regimen.   I encouraged lifestyle modifications.   2.  HTN/MA associated with diabetes.  Her BP is good today on HZTZ and lisinopril .   3.  HLD associated with diabetes.  She was started her on atorvastatin  by her pcp.  LDL from 4/24 was 92 on 20 mg atorvastatin  daily.  I will re-check lipid panel.    4.  Tobacco abuse.  She says she has quit smoking.  I congratulated her and encouraged her to remain off.   5.  Prophylaxis.  I did a foot exam today.  We last got a copy from the eye doctor ([facility]) on 06/03/23 and had no retinopathy.  She is to f/u in 6/25. When she is seen, I told her to have them send me a copy of the report.   6.  She will return to clinic in 6 months. She says she has a new female PCP now that Dr. Joshua is retiring.    Addendum:  04/26/2024 :  K/Cr/Ca= 4.6/0.8/9.1.  MA=10.9.  Chol=165/77/69.8/80.  LFTs nl.  TSH=1.674.  Results sent via MyChart   11/04/24:  Pt cx'd.  Just out of hospital from right internal Iliac artery aneurysm repair with graf and coil embolization.  Told not to drive for 2 weeks.   This note is partially prepared by [facility], Scribe, in the presence of and acting as the scribe of Dr. Debby Breaker , MD.     [facility], MD

## 2024-05-03 ENCOUNTER — Encounter (INDEPENDENT_AMBULATORY_CARE_PROVIDER_SITE_OTHER): Payer: Self-pay

## 2024-06-07 DIAGNOSIS — H1045 Other chronic allergic conjunctivitis: Secondary | ICD-10-CM | POA: Diagnosis not present

## 2024-06-07 DIAGNOSIS — H40013 Open angle with borderline findings, low risk, bilateral: Secondary | ICD-10-CM | POA: Diagnosis not present

## 2024-06-07 DIAGNOSIS — H2513 Age-related nuclear cataract, bilateral: Secondary | ICD-10-CM | POA: Diagnosis not present

## 2024-06-07 DIAGNOSIS — H5203 Hypermetropia, bilateral: Secondary | ICD-10-CM | POA: Diagnosis not present

## 2024-06-07 DIAGNOSIS — E119 Type 2 diabetes mellitus without complications: Secondary | ICD-10-CM | POA: Diagnosis not present

## 2024-06-07 LAB — OPHTHALMOLOGY REPORT-SCANNED

## 2024-06-16 DIAGNOSIS — M4716 Other spondylosis with myelopathy, lumbar region: Secondary | ICD-10-CM | POA: Diagnosis not present

## 2024-06-16 DIAGNOSIS — G894 Chronic pain syndrome: Secondary | ICD-10-CM | POA: Diagnosis not present

## 2024-06-16 DIAGNOSIS — Z79899 Other long term (current) drug therapy: Secondary | ICD-10-CM | POA: Diagnosis not present

## 2024-06-16 DIAGNOSIS — M25561 Pain in right knee: Secondary | ICD-10-CM | POA: Diagnosis not present

## 2024-06-16 DIAGNOSIS — M5116 Intervertebral disc disorders with radiculopathy, lumbar region: Secondary | ICD-10-CM | POA: Diagnosis not present

## 2024-06-16 DIAGNOSIS — M25562 Pain in left knee: Secondary | ICD-10-CM | POA: Diagnosis not present

## 2024-07-11 DIAGNOSIS — K269 Duodenal ulcer, unspecified as acute or chronic, without hemorrhage or perforation: Secondary | ICD-10-CM | POA: Diagnosis not present

## 2024-08-03 DIAGNOSIS — K269 Duodenal ulcer, unspecified as acute or chronic, without hemorrhage or perforation: Secondary | ICD-10-CM | POA: Diagnosis not present

## 2024-08-04 ENCOUNTER — Other Ambulatory Visit (INDEPENDENT_AMBULATORY_CARE_PROVIDER_SITE_OTHER): Payer: Self-pay | Admitting: Nurse Practitioner

## 2024-08-04 DIAGNOSIS — I723 Aneurysm of iliac artery: Secondary | ICD-10-CM

## 2024-08-11 DIAGNOSIS — Z79891 Long term (current) use of opiate analgesic: Secondary | ICD-10-CM | POA: Diagnosis not present

## 2024-08-11 DIAGNOSIS — M5116 Intervertebral disc disorders with radiculopathy, lumbar region: Secondary | ICD-10-CM | POA: Diagnosis not present

## 2024-08-11 DIAGNOSIS — M4716 Other spondylosis with myelopathy, lumbar region: Secondary | ICD-10-CM | POA: Diagnosis not present

## 2024-08-11 DIAGNOSIS — G894 Chronic pain syndrome: Secondary | ICD-10-CM | POA: Diagnosis not present

## 2024-08-11 DIAGNOSIS — M25561 Pain in right knee: Secondary | ICD-10-CM | POA: Diagnosis not present

## 2024-08-11 DIAGNOSIS — M25562 Pain in left knee: Secondary | ICD-10-CM | POA: Diagnosis not present

## 2024-08-11 DIAGNOSIS — Z79899 Other long term (current) drug therapy: Secondary | ICD-10-CM | POA: Diagnosis not present

## 2024-08-17 ENCOUNTER — Encounter (INDEPENDENT_AMBULATORY_CARE_PROVIDER_SITE_OTHER): Payer: Self-pay | Admitting: Nurse Practitioner

## 2024-08-17 ENCOUNTER — Ambulatory Visit (INDEPENDENT_AMBULATORY_CARE_PROVIDER_SITE_OTHER): Admitting: Nurse Practitioner

## 2024-08-17 ENCOUNTER — Other Ambulatory Visit (INDEPENDENT_AMBULATORY_CARE_PROVIDER_SITE_OTHER)

## 2024-08-17 VITALS — BP 138/85 | HR 71 | Ht 61.0 in | Wt 194.2 lb

## 2024-08-17 DIAGNOSIS — I723 Aneurysm of iliac artery: Secondary | ICD-10-CM

## 2024-08-17 DIAGNOSIS — I10 Essential (primary) hypertension: Secondary | ICD-10-CM

## 2024-08-17 DIAGNOSIS — F17200 Nicotine dependence, unspecified, uncomplicated: Secondary | ICD-10-CM | POA: Diagnosis not present

## 2024-08-24 NOTE — Progress Notes (Signed)
 Subjective:    Patient ID: Erica Ruiz, female    DOB: Apr 29, 1961, 63 y.o.   MRN: [id number] Chief Complaint  Patient presents with   Follow-up    Erica Ruiz is a 63 year old female who presents today after incidental finding of a right common iliac artery aneurysm.  She initially presented to the hospital with hematochezia.  Following her hospitalization she has not had any further issues.  However during her hospitalization it was found that she had a 1.8 cm common iliac artery aneurysm. Today, she returns today for repeat imaging of her aneurysm.   Initially it was felt to be that it is possibly mycotic in nature however the patient has not had any resolving infection such as sepsis.  She was septic about 15 to 20 years ago.  She denies any abdominal pain.  No signs symptoms of distal embolization.  Today her common iliac artery measures 2.4 cm which is considered to be significant growth on her previous studies.     Review of Systems  Gastrointestinal:  Negative for blood in stool.  All other systems reviewed and are negative.      Objective:   Physical Exam Vitals reviewed.  HENT:     Head: Normocephalic.  Cardiovascular:     Rate and Rhythm: Normal rate.  Pulmonary:     Effort: Pulmonary effort is normal.  Skin:    General: Skin is warm and dry.  Neurological:     Mental Status: She is alert and oriented to person, place, and time.  Psychiatric:        Mood and Affect: Mood normal.        Behavior: Behavior normal.        Thought Content: Thought content normal.        Judgment: Judgment normal.     BP 138/85   Pulse 71   Ht 5' 1 (1.549 m)   Wt 194 lb 4 oz (88.1 kg)   BMI 36.70 kg/m   Past Medical History:  Diagnosis Date   Arthritis unsure   Back pain    Diabetes mellitus without complication (HCC)    GERD (gastroesophageal reflux disease) unsure   Hypertension     Social History   Socioeconomic History   Marital status: Married    Spouse  name: Koren Corral   Number of children: 4   Years of education: Not on file   Highest education level: GED or equivalent  Occupational History   Occupation: Disabled  Tobacco Use   Smoking status: Every Day    Current packs/day: 0.50    Average packs/day: 0.5 packs/day for 30.7 years (15.3 ttl pk-yrs)    Types: Cigarettes    Start date: 1995   Smokeless tobacco: Never  Vaping Use   Vaping status: Never Used  Substance and Sexual Activity   Alcohol use: Never   Drug use: Never   Sexual activity: Yes    Birth control/protection: Post-menopausal  Other Topics Concern   Not on file  Social History Narrative   ** Merged History Encounter **       Social Drivers of Health   Financial Resource Strain: Low Risk  (07/12/2024)   Received from [facility]   Overall Financial Resource Strain (CARDIA)    Difficulty of Paying Living Expenses: Not very hard  Food Insecurity: No Food Insecurity (07/12/2024)   Received from [facility]   Hunger Vital Sign    Within the past 12  months, you worried that your food would run out before you got the money to buy more.: Never true    Within the past 12 months, the food you bought just didn't last and you didn't have money to get more.: Never true  Transportation Needs: No Transportation Needs (07/12/2024)   Received from [facility] - Transportation    In the past 12 months, has lack of transportation kept you from medical appointments or from getting medications?: No    Lack of Transportation (Non-Medical): No  Physical Activity: Inactive (03/16/2024)   Exercise Vital Sign    Days of Exercise per Week: 0 days    Minutes of Exercise per Session: 20 min  Stress: No Stress Concern Present (03/16/2024)   [employer] of Occupational Health - Occupational Stress Questionnaire    Feeling of Stress : Not at all  Social Connections: Unknown (03/16/2024)   Social Connection and Isolation  Panel    Frequency of Communication with Friends and Family: More than three times a week    Frequency of Social Gatherings with Friends and Family: More than three times a week    Attends Religious Services: Patient declined    Database administrator or Organizations: Patient declined    Attends Banker Meetings: Never    Marital Status: Married  Catering manager Violence: Not At Risk (01/21/2024)   Humiliation, Afraid, Rape, and Kick questionnaire    Fear of Current or Ex-Partner: No    Emotionally Abused: No    Physically Abused: No    Sexually Abused: No    Past Surgical History:  Procedure Laterality Date   BREAST BIOPSY Right    neg bx/clip   CHOLECYSTECTOMY  unsure   COLON SURGERY  unsure   COLONOSCOPY WITH PROPOFOL  N/A 08/23/2018   Procedure: COLONOSCOPY WITH PROPOFOL  with biopsies;  Surgeon: Jinny Carmine, MD;  Location: [facility];  Service: Endoscopy;  Laterality: N/A;  DIABETIC   COLONOSCOPY WITH PROPOFOL  N/A 10/13/2023   Procedure: COLONOSCOPY WITH BIOPSY;  Surgeon: Jinny Carmine, MD;  Location: [facility];  Service: Endoscopy;  Laterality: N/A;  Diabetic   COLOSTOMY REVERSAL     ESOPHAGOGASTRODUODENOSCOPY (EGD) WITH PROPOFOL  N/A 01/17/2024   Procedure: ESOPHAGOGASTRODUODENOSCOPY (EGD) WITH PROPOFOL ;  Surgeon: Maryruth Ole DASEN, MD;  Location: [facility] ENDOSCOPY;  Service: Endoscopy;  Laterality: N/A;   ESOPHAGOGASTRODUODENOSCOPY (EGD) WITH PROPOFOL  N/A 01/21/2024   Procedure: ESOPHAGOGASTRODUODENOSCOPY (EGD) WITH PROPOFOL ;  Surgeon: Jinny Carmine, MD;  Location: [facility] ENDOSCOPY;  Service: Endoscopy;  Laterality: N/A;   HEMOSTASIS CLIP PLACEMENT  01/17/2024   Procedure: HEMOSTASIS CLIP PLACEMENT;  Surgeon: Maryruth Ole DASEN, MD;  Location: [facility] ENDOSCOPY;  Service: Endoscopy;;   HEMOSTASIS CONTROL  01/17/2024   Procedure: HEMOSTASIS CONTROL;  Surgeon: Maryruth Ole DASEN, MD;  Location: [facility] ENDOSCOPY;  Service: Endoscopy;;   HOT HEMOSTASIS   01/21/2024   Procedure: HOT HEMOSTASIS (ARGON PLASMA COAGULATION/BICAP);  Surgeon: Jinny Carmine, MD;  Location: [facility];  Service: Endoscopy;;   KNEE SURGERY     POLYPECTOMY N/A 08/23/2018   Procedure: POLYPECTOMY;  Surgeon: Jinny Carmine, MD;  Location: [facility];  Service: Endoscopy;  Laterality: N/A;   POLYPECTOMY  10/13/2023   Procedure: POLYPECTOMY;  Surgeon: Jinny Carmine, MD;  Location: [facility];  Service: Endoscopy;;   SUBMUCOSAL INJECTION  01/17/2024   Procedure: SUBMUCOSAL INJECTION;  Surgeon: Maryruth Ole DASEN, MD;  Location: [facility] ENDOSCOPY;  Service: Endoscopy;;   TONSILLECTOMY     TUBAL LIGATION  1991    Family History  Problem Relation Age of Onset   Pulmonary embolism Mother    Early death Mother    Obesity Mother    Cancer Father        prostate   Heart disease Father    Hypertension Father    Alcohol abuse Father    COPD Father    Hearing loss Father    Cancer Sister        non-hodgkin's lymphoma   Hepatitis C Sister    Cancer Brother        colon   Diabetes Brother    Heart disease Brother    Hypertension Brother    Diabetes Daughter    Diabetes Brother    Hypertension Sister    Cancer Brother        prostate   Breast cancer Paternal Aunt    Cancer Sister    Early death Sister    Obesity Sister    Cancer Brother    Diabetes Brother    Early death Brother    Cancer Brother    Obesity Brother    COPD Sister    Hypertension Sister    Obesity Sister    Diabetes Brother     No Known Allergies     Latest Ref Rng & Units 01/29/2024    2:19 PM 01/22/2024    4:30 AM 01/21/2024   10:15 PM  CBC  WBC 3.4 - 10.8 x10E3/uL 5.5  5.7    Hemoglobin 11.1 - 15.9 g/dL 89.6  8.2  8.6   Hematocrit 34.0 - 46.6 % 32.8  24.3  25.8   Platelets 150 - 450 x10E3/uL 334  207        CMP     Component Value Date/Time   NA 141 01/29/2024 1419   K 4.5 01/29/2024 1419   CL 103 01/29/2024 1419   CO2 25 01/29/2024 1419   GLUCOSE 97  01/29/2024 1419   GLUCOSE 86 01/21/2024 0537   BUN 7 (L) 01/29/2024 1419   CREATININE 0.71 01/29/2024 1419   CALCIUM  9.0 01/29/2024 1419   PROT 5.8 (L) 01/29/2024 1419   ALBUMIN 3.8 (L) 01/29/2024 1419   AST 7 01/29/2024 1419   ALT 5 01/29/2024 1419   ALKPHOS 49 01/29/2024 1419   BILITOT <0.2 01/29/2024 1419   EGFR 97 01/29/2024 1419   GFRNONAA >60 01/21/2024 0537     No results found.     Assessment & Plan:   1. Aneurysm of right common iliac artery (HCC) (Primary)  CT scan of the patient's common iliac artery aneurysm shows 2.4 cm which should be significant growth from the previous 1.8 cm.  While, this is below the typical threshold of 3 cm for repair, it is considered rapid growth with greater than 0.5 cm in 6 months.  We discussed the typical endovascular method of repair.  We discussed that the best intervention for this is to control her blood pressure, which looks very good today.  In addition she is recommended to stop smoking.  Based on the growth of the aneurysm we will have the patient undergo a CT angiogram for planning for possible intervention.  2. Essential hypertension Continue antihypertensive medications as already ordered, these medications have been reviewed and there are no changes at this time.  3. Tobacco dependence Smoking cessation was discussed, 3-10 minutes spent on this topic specifically   Current Outpatient Medications on File Prior to Visit  Medication Sig Dispense Refill  atorvastatin  (LIPITOR) 20 MG tablet Take 20 mg by mouth daily.     DULoxetine  (CYMBALTA ) 30 MG capsule Take 30 mg by mouth daily.  3   fluticasone  (FLONASE ) 50 MCG/ACT nasal spray Place 2 sprays into both nostrils daily. (Patient taking differently: Place 2 sprays into both nostrils daily as needed for rhinitis or allergies.) 16 g 6   gabapentin  (NEURONTIN ) 600 MG tablet Take 600 mg by mouth 2 (two) times daily.  3   glucose blood test strip 1 each by Other route 2 (two) times  daily. 100 each 0   hydrochlorothiazide  (HYDRODIURIL ) 25 MG tablet Take 1 tablet (25 mg total) by mouth daily. 90 tablet 1   Lancets (ONETOUCH ULTRASOFT) lancets 1 each by Other route 2 (two) times daily. 100 each 0   lisinopril  (ZESTRIL ) 10 MG tablet Take 1 tablet (10 mg total) by mouth daily. 90 tablet 1   morphine  (MS CONTIN ) 15 MG 12 hr tablet Take 15 mg by mouth every 8 (eight) hours as needed for pain.  0   NARCAN  4 MG/0.1ML LIQD nasal spray kit Place 1 spray into the nose once.     ondansetron  (ZOFRAN -ODT) 8 MG disintegrating tablet Take 1 tablet (8 mg total) by mouth every 8 (eight) hours as needed for nausea or vomiting. (Patient not taking: Reported on 03/17/2024) 20 tablet 0   Oxycodone  HCl 10 MG TABS Take 10 mg by mouth 4 (four) times daily as needed. Pain clinic     pantoprazole  (PROTONIX ) 40 MG tablet Take 1 tablet (40 mg total) by mouth 2 (two) times daily. (Patient not taking: Reported on 03/17/2024) 60 tablet 1   Probiotic Product (PROBIOTIC DAILY PO) Take 1 tablet by mouth daily.     SYNJARDY XR 12.04-999 MG TB24 Take 2 tablets by mouth daily. [place]     tiZANidine  (ZANAFLEX ) 4 MG tablet Take 4 mg by mouth every 8 (eight) hours as needed for muscle spasms.  3   No current facility-administered medications on file prior to visit.    There are no Patient Instructions on file for this visit. No follow-ups on file.   Priscillia Fouch E Amarylis Rovito, NP

## 2024-08-29 ENCOUNTER — Telehealth (INDEPENDENT_AMBULATORY_CARE_PROVIDER_SITE_OTHER): Payer: Self-pay | Admitting: Nurse Practitioner

## 2024-08-29 NOTE — Telephone Encounter (Signed)
 LVM for pt advising that I received authorization for the CT that is already scheduled. I advised to call the office to make a CT results appt with either Drs. Dew or Schnier.

## 2024-08-31 ENCOUNTER — Ambulatory Visit
Admission: RE | Admit: 2024-08-31 | Discharge: 2024-08-31 | Disposition: A | Discharge status: Home / Self Care | Source: Ambulatory Visit | Attending: Nurse Practitioner | Admitting: Nurse Practitioner

## 2024-08-31 DIAGNOSIS — K439 Ventral hernia without obstruction or gangrene: Secondary | ICD-10-CM | POA: Diagnosis not present

## 2024-08-31 DIAGNOSIS — I723 Aneurysm of iliac artery: Secondary | ICD-10-CM | POA: Diagnosis not present

## 2024-08-31 DIAGNOSIS — I700 Atherosclerosis of aorta: Secondary | ICD-10-CM | POA: Diagnosis not present

## 2024-08-31 MED ORDER — IOHEXOL 350 MG/ML SOLN
100.0000 mL | Freq: Once | INTRAVENOUS | Status: AC | PRN
Start: 2024-08-31 — End: 2024-08-31
  Administered 2024-08-31: 100 mL via INTRAVENOUS

## 2024-09-08 ENCOUNTER — Encounter (INDEPENDENT_AMBULATORY_CARE_PROVIDER_SITE_OTHER): Payer: Self-pay | Admitting: Nurse Practitioner

## 2024-09-08 ENCOUNTER — Ambulatory Visit (INDEPENDENT_AMBULATORY_CARE_PROVIDER_SITE_OTHER): Admitting: Nurse Practitioner

## 2024-09-08 VITALS — BP 134/82 | HR 89 | Ht 61.0 in | Wt 191.0 lb

## 2024-09-08 DIAGNOSIS — I723 Aneurysm of iliac artery: Secondary | ICD-10-CM

## 2024-09-08 DIAGNOSIS — F17200 Nicotine dependence, unspecified, uncomplicated: Secondary | ICD-10-CM | POA: Diagnosis not present

## 2024-09-08 DIAGNOSIS — I10 Essential (primary) hypertension: Secondary | ICD-10-CM | POA: Diagnosis not present

## 2024-09-11 ENCOUNTER — Encounter (INDEPENDENT_AMBULATORY_CARE_PROVIDER_SITE_OTHER): Payer: Self-pay | Admitting: Nurse Practitioner

## 2024-09-11 NOTE — Progress Notes (Signed)
 Subjective:    Patient ID: Erica Ruiz, female    DOB: May 05, 1961, 64 y.o.   MRN: [id number] Chief Complaint  Patient presents with   Follow-up    Erica Ruiz is a 63 year old female who presents today after incidental finding of a right common iliac artery aneurysm.  She initially presented to the hospital with hematochezia.  Following her hospitalization she has not had any further issues.  However during her hospitalization it was found that she had a 1.8 cm common iliac artery aneurysm in February 2025. Today, she returns today for repeat imaging of her aneurysm.   Initially it was felt to be that it is possibly mycotic in nature however the patient has not had any resolving infection such as sepsis.  She was septic about 15 to 20 years ago.  She denies any abdominal pain.  No signs symptoms of distal embolization.  Her previous ultrasound showed  AAA 2.4 cm aneurysm.  Given the concern for rapid growth she underwent CT scan which has been independently reviewed by myself as well as Dr. Jama and it was noted that the patient had a 2.6 cm aneurysm which would be considered a rapid rate from her previous studies.    Review of Systems  All other systems reviewed and are negative.      Objective:   Physical Exam Vitals reviewed.  HENT:     Head: Normocephalic.  Cardiovascular:     Rate and Rhythm: Normal rate.  Pulmonary:     Effort: Pulmonary effort is normal.  Skin:    General: Skin is warm and dry.  Neurological:     Mental Status: She is alert and oriented to person, place, and time.  Psychiatric:        Mood and Affect: Mood normal.        Behavior: Behavior normal.        Thought Content: Thought content normal.        Judgment: Judgment normal.     BP 134/82   Pulse 89   Ht 5' 1 (1.549 m)   Wt 191 lb (86.6 kg)   BMI 36.09 kg/m   Past Medical History:  Diagnosis Date   Arthritis unsure   Back pain    Diabetes mellitus without complication (HCC)     GERD (gastroesophageal reflux disease) unsure   Hypertension     Social History   Socioeconomic History   Marital status: Married    Spouse name: Koren Corral   Number of children: 4   Years of education: Not on file   Highest education level: GED or equivalent  Occupational History   Occupation: Disabled  Tobacco Use   Smoking status: Every Day    Current packs/day: 0.50    Average packs/day: 0.5 packs/day for 30.7 years (15.4 ttl pk-yrs)    Types: Cigarettes    Start date: 1995   Smokeless tobacco: Never  Vaping Use   Vaping status: Never Used  Substance and Sexual Activity   Alcohol use: Never   Drug use: Never   Sexual activity: Yes    Birth control/protection: Post-menopausal  Other Topics Concern   Not on file  Social History Narrative   ** Merged History Encounter **       Social Drivers of Health   Financial Resource Strain: Low Risk  (07/12/2024)   Received from [facility]   Overall Financial Resource Strain (CARDIA)    Difficulty of Paying Living Expenses: Not very  hard  Food Insecurity: No Food Insecurity (07/12/2024)   Received from [facility]   Hunger Vital Sign    Within the past 12 months, you worried that your food would run out before you got the money to buy more.: Never true    Within the past 12 months, the food you bought just didn't last and you didn't have money to get more.: Never true  Transportation Needs: No Transportation Needs (07/12/2024)   Received from [facility] - Transportation    In the past 12 months, has lack of transportation kept you from medical appointments or from getting medications?: No    Lack of Transportation (Non-Medical): No  Physical Activity: Inactive (03/16/2024)   Exercise Vital Sign    Days of Exercise per Week: 0 days    Minutes of Exercise per Session: 20 min  Stress: No Stress Concern Present (03/16/2024)   [employer] of Occupational Health  - Occupational Stress Questionnaire    Feeling of Stress : Not at all  Social Connections: Unknown (03/16/2024)   Social Connection and Isolation Panel    Frequency of Communication with Friends and Family: More than three times a week    Frequency of Social Gatherings with Friends and Family: More than three times a week    Attends Religious Services: Patient declined    Database administrator or Organizations: Patient declined    Attends Banker Meetings: Never    Marital Status: Married  Catering manager Violence: Not At Risk (01/21/2024)   Humiliation, Afraid, Rape, and Kick questionnaire    Fear of Current or Ex-Partner: No    Emotionally Abused: No    Physically Abused: No    Sexually Abused: No    Past Surgical History:  Procedure Laterality Date   BREAST BIOPSY Right    neg bx/clip   CHOLECYSTECTOMY  unsure   COLON SURGERY  unsure   COLONOSCOPY WITH PROPOFOL  N/A 08/23/2018   Procedure: COLONOSCOPY WITH PROPOFOL  with biopsies;  Surgeon: Jinny Carmine, MD;  Location: [facility];  Service: Endoscopy;  Laterality: N/A;  DIABETIC   COLONOSCOPY WITH PROPOFOL  N/A 10/13/2023   Procedure: COLONOSCOPY WITH BIOPSY;  Surgeon: Jinny Carmine, MD;  Location: [facility];  Service: Endoscopy;  Laterality: N/A;  Diabetic   COLOSTOMY REVERSAL     ESOPHAGOGASTRODUODENOSCOPY (EGD) WITH PROPOFOL  N/A 01/17/2024   Procedure: ESOPHAGOGASTRODUODENOSCOPY (EGD) WITH PROPOFOL ;  Surgeon: Maryruth Ole DASEN, MD;  Location: [facility] ENDOSCOPY;  Service: Endoscopy;  Laterality: N/A;   ESOPHAGOGASTRODUODENOSCOPY (EGD) WITH PROPOFOL  N/A 01/21/2024   Procedure: ESOPHAGOGASTRODUODENOSCOPY (EGD) WITH PROPOFOL ;  Surgeon: Jinny Carmine, MD;  Location: [facility] ENDOSCOPY;  Service: Endoscopy;  Laterality: N/A;   HEMOSTASIS CLIP PLACEMENT  01/17/2024   Procedure: HEMOSTASIS CLIP PLACEMENT;  Surgeon: Maryruth Ole DASEN, MD;  Location: [facility] ENDOSCOPY;  Service: Endoscopy;;   HEMOSTASIS CONTROL   01/17/2024   Procedure: HEMOSTASIS CONTROL;  Surgeon: Maryruth Ole DASEN, MD;  Location: [facility] ENDOSCOPY;  Service: Endoscopy;;   HOT HEMOSTASIS  01/21/2024   Procedure: HOT HEMOSTASIS (ARGON PLASMA COAGULATION/BICAP);  Surgeon: Jinny Carmine, MD;  Location: [facility] ENDOSCOPY;  Service: Endoscopy;;   KNEE SURGERY     POLYPECTOMY N/A 08/23/2018   Procedure: POLYPECTOMY;  Surgeon: Jinny Carmine, MD;  Location: [facility];  Service: Endoscopy;  Laterality: N/A;   POLYPECTOMY  10/13/2023   Procedure: POLYPECTOMY;  Surgeon: Jinny Carmine, MD;  Location:  [facility];  Service: Endoscopy;;   SUBMUCOSAL INJECTION  01/17/2024   Procedure: SUBMUCOSAL INJECTION;  Surgeon: Maryruth Ole DASEN, MD;  Location: [facility] ENDOSCOPY;  Service: Endoscopy;;   TONSILLECTOMY     TUBAL LIGATION  1991    Family History  Problem Relation Age of Onset   Pulmonary embolism Mother    Early death Mother    Obesity Mother    Cancer Father        prostate   Heart disease Father    Hypertension Father    Alcohol abuse Father    COPD Father    Hearing loss Father    Cancer Sister        non-hodgkin's lymphoma   Hepatitis C Sister    Cancer Brother        colon   Diabetes Brother    Heart disease Brother    Hypertension Brother    Diabetes Daughter    Diabetes Brother    Hypertension Sister    Cancer Brother        prostate   Breast cancer Paternal Aunt    Cancer Sister    Early death Sister    Obesity Sister    Cancer Brother    Diabetes Brother    Early death Brother    Cancer Brother    Obesity Brother    COPD Sister    Hypertension Sister    Obesity Sister    Diabetes Brother     No Known Allergies     Latest Ref Rng & Units 01/29/2024    2:19 PM 01/22/2024    4:30 AM 01/21/2024   10:15 PM  CBC  WBC 3.4 - 10.8 x10E3/uL 5.5  5.7    Hemoglobin 11.1 - 15.9 g/dL 89.6  8.2  8.6   Hematocrit 34.0 - 46.6 % 32.8  24.3  25.8   Platelets 150 - 450 x10E3/uL 334  207        CMP      Component Value Date/Time   NA 141 01/29/2024 1419   K 4.5 01/29/2024 1419   CL 103 01/29/2024 1419   CO2 25 01/29/2024 1419   GLUCOSE 97 01/29/2024 1419   GLUCOSE 86 01/21/2024 0537   BUN 7 (L) 01/29/2024 1419   CREATININE 0.71 01/29/2024 1419   CALCIUM  9.0 01/29/2024 1419   PROT 5.8 (L) 01/29/2024 1419   ALBUMIN 3.8 (L) 01/29/2024 1419   AST 7 01/29/2024 1419   ALT 5 01/29/2024 1419   ALKPHOS 49 01/29/2024 1419   BILITOT <0.2 01/29/2024 1419   EGFR 97 01/29/2024 1419   GFRNONAA >60 01/21/2024 0537     No results found.     Assessment & Plan:   1. 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  And therefore should undergo repair. Patient is status post CT scan of the abdominal aorta and iliac artery aneurysm.  The initial scan in February had an iliac artery aneurysm measured at 1.8 cm that her most recent was an aneurysm measured at 2.6 cm.  Base on evaluation of the CT scan by me the patient is a candidate for endovascular repair.  Additionally, the patient will require embolization of the right internal iliac artery to gain control of her aneurysmal growth and prevent endoleak.    The patient will require cardiac clearance prior to stent graft placement.   The patient will continue antiplatelet therapy as prescribed (since the patient is undergoing  endovascular repair as opposed to open repair) as well as aggressive management of hyperlipidemia. Exercise is encouraged.   The patient is reminded that lifetime routine surveillance is a necessity with an endograft.   The risks and benefits of AAA repair are reviewed with the patient.  All questions are answered.  Alternative therapies are also discussed.  The patient agrees to proceed with endovascular aneurysm repair to prevent lethal rupture.  Patient will follow-up with me in the office after the surgery.  2.  Essential hypertension Continue antihypertensive medications as already ordered, these medications have been reviewed and there are no changes at this time.  3. Tobacco dependence Smoking cessation was discussed, 3-10 minutes spent on this topic specifically   Current Outpatient Medications on File Prior to Visit  Medication Sig Dispense Refill   atorvastatin  (LIPITOR) 20 MG tablet Take 20 mg by mouth daily.     DULoxetine  (CYMBALTA ) 30 MG capsule Take 30 mg by mouth daily.  3   fluticasone  (FLONASE ) 50 MCG/ACT nasal spray Place 2 sprays into both nostrils daily. (Patient taking differently: Place 2 sprays into both nostrils daily as needed for rhinitis or allergies.) 16 g 6   gabapentin  (NEURONTIN ) 600 MG tablet Take 600 mg by mouth 2 (two) times daily.  3   glucose blood test strip 1 each by Other route 2 (two) times daily. 100 each 0   hydrochlorothiazide  (HYDRODIURIL ) 25 MG tablet Take 1 tablet (25 mg total) by mouth daily. 90 tablet 1   Lancets (ONETOUCH ULTRASOFT) lancets 1 each by Other route 2 (two) times daily. 100 each 0   lisinopril  (ZESTRIL ) 10 MG tablet Take 1 tablet (10 mg total) by mouth daily. 90 tablet 1   morphine  (MS CONTIN ) 15 MG 12 hr tablet Take 15 mg by mouth every 8 (eight) hours as needed for pain.  0   NARCAN  4 MG/0.1ML LIQD nasal spray kit Place 1 spray into the nose once.     ondansetron  (ZOFRAN -ODT) 8 MG disintegrating tablet Take 1 tablet (8 mg total) by mouth every 8 (eight) hours as needed for nausea or vomiting. (Patient not taking: Reported on 03/17/2024) 20 tablet 0   Oxycodone  HCl 10 MG TABS Take 10 mg by mouth 4 (four) times daily as needed. Pain clinic     pantoprazole  (PROTONIX ) 40 MG tablet Take 1 tablet (40 mg total) by mouth 2 (two) times daily. (Patient not taking: Reported on 03/17/2024) 60 tablet 1   Probiotic Product (PROBIOTIC DAILY PO) Take 1 tablet by mouth daily.     SYNJARDY XR 12.04-999 MG TB24 Take 2 tablets by mouth daily. O'Connell      tiZANidine  (ZANAFLEX ) 4 MG tablet Take 4 mg by mouth every 8 (eight) hours as needed for muscle spasms.  3   No current facility-administered medications on file prior to visit.    There are no Patient Instructions on file for this visit. No follow-ups on file.   Erica Ruiz E Jaquita Bessire, NP

## 2024-09-13 ENCOUNTER — Telehealth (INDEPENDENT_AMBULATORY_CARE_PROVIDER_SITE_OTHER): Payer: Self-pay

## 2024-09-13 NOTE — Telephone Encounter (Signed)
 Call to pt regarding her pt questionnaire response wanting to know next steps for scheduling her surgery. Advised her that scheduling will be reaching out to her with further information. Patient verbalized understanding.

## 2024-09-13 NOTE — Telephone Encounter (Signed)
 I contacted the patient and explained that after she is cleared by cardiology I will call to get her scheduled to have her surgery with Dr. Marea. Patient stated she understood.

## 2024-09-14 ENCOUNTER — Encounter: Admitting: Student

## 2024-09-14 DIAGNOSIS — E119 Type 2 diabetes mellitus without complications: Secondary | ICD-10-CM | POA: Insufficient documentation

## 2024-09-16 ENCOUNTER — Ambulatory Visit: Admitting: Student

## 2024-10-10 NOTE — Progress Notes (Unsigned)
 Cardiology Office Note  Date:  10/11/2024   ID:  Erica Ruiz, DOB 09/16/1961, MRN [id number]  PCP:  Lemon Raisin, MD   Chief Complaint  Patient presents with   New Patient (Initial Visit)    The pt denies SOB and chest pain.The patient was referred by Orvin Daring NP for Aneurysm of right common iliac artery.    HPI:  Erica Ruiz is a 63 y.o. female with past medical history of: Past Medical History:  Diagnosis Date   Arthritis unsure   Back pain    Diabetes mellitus without complication (HCC)    GERD (gastroesophageal reflux disease) unsure   Hypertension   Smoker, quit 4 months Aortic atherosclerosis Right common iliac artery aneurysm Who presents by referral from Orvin Daring for consultation of her aneurysm right common iliac artery, preop cardiovascular evaluation  Reports recent diagnosis of iliac artery aneurysm that has grown in size on repeat imaging  CT angio abdomen pelvis September 2025, imaging reviewed Abnormal appearance of the right common iliac artery with irregular saccular aneurysm with multilobulated appearance. Using multiplanar imaging, this is favored to have increased in size with maximal diameter estimated at 2.6 cm, previously 2.2 cm.  Scheduled for endovascular repair which would require embolization/stent graft placement of the right internal iliac artery  She denies significant shortness of breath or chest pain on exertion Activity limited secondary to chronic back pain  Chronic lower extremity swelling consistent with lymphedema  CT chest December 2023 Images pulled up and reviewed showing scant aortic atherosclerosis in the arch, mild coronary calcification in the LAD  Lab work reviewed Total cholesterol 165 LDL 80 A1c 5.8 Normal CMP  EKG personally reviewed by myself on todays visit EKG Interpretation Date/Time:  Tuesday October 11 2024 10:59:49 EDT Ventricular Rate:  72 PR Interval:  166 QRS Duration:  92 QT  Interval:  390 QTC Calculation: 427 R Axis:   32  Text Interpretation: Normal sinus rhythm Normal ECG When compared with ECG of 20-Jan-2024 22:36, No significant change was found Confirmed by Perla Lye [phone]) on 10/11/2024 11:05:07 AM    PMH:   has a past medical history of Arthritis (unsure), Back pain, Diabetes mellitus without complication (HCC), GERD (gastroesophageal reflux disease) (unsure), and Hypertension.   PSH:    Past Surgical History:  Procedure Laterality Date   BREAST BIOPSY Right    neg bx/clip   CHOLECYSTECTOMY  unsure   COLON SURGERY  unsure   COLONOSCOPY WITH PROPOFOL  N/A 08/23/2018   Procedure: COLONOSCOPY WITH PROPOFOL  with biopsies;  Surgeon: Jinny Carmine, MD;  Location: [facility];  Service: Endoscopy;  Laterality: N/A;  DIABETIC   COLONOSCOPY WITH PROPOFOL  N/A 10/13/2023   Procedure: COLONOSCOPY WITH BIOPSY;  Surgeon: Jinny Carmine, MD;  Location: [facility];  Service: Endoscopy;  Laterality: N/A;  Diabetic   COLOSTOMY REVERSAL     ESOPHAGOGASTRODUODENOSCOPY (EGD) WITH PROPOFOL  N/A 01/17/2024   Procedure: ESOPHAGOGASTRODUODENOSCOPY (EGD) WITH PROPOFOL ;  Surgeon: Maryruth Ole DASEN, MD;  Location: [facility] END[facility];  Service: Endoscopy;  Laterality: N/A;   ESOPHAGOGASTRODUODENOSCOPY (EGD) WITH PROPOFOL  N/A 01/21/2024   Procedure: ESOPHAGOGASTRODUODENOSCOPY (EGD) WITH PROPOFOL ;  Surgeon: Jinny Carmine, MD;  Location: [facility] END[facility];  Service: Endoscopy;  Laterality: N/A;   HEMOSTASIS CLIP PLACEMENT  01/17/2024   Procedure: HEMOSTASIS CLIP PLACEMENT;  Surgeon: Maryruth Ole DASEN, MD;  Location: [facility] END[facility];  Service: Endoscopy;;   HEMOSTASIS CONTROL  01/17/2024   Procedure: HEMOSTASIS CONTROL;  Surgeon: Maryruth Ole DASEN, MD;  Location: [facility] ENDOSCOPY;  Service: Endoscopy;;   HOT HEMOSTASIS  01/21/2024   Procedure: HOT HEMOSTASIS (ARGON PLASMA COAGULATION/BICAP);  Surgeon: Jinny Carmine, MD;  Location: [facility] ENDOSCOPY;  Service: Endoscopy;;    KNEE SURGERY     POLYPECTOMY N/A 08/23/2018   Procedure: POLYPECTOMY;  Surgeon: Jinny Carmine, MD;  Location: [facility];  Service: Endoscopy;  Laterality: N/A;   POLYPECTOMY  10/13/2023   Procedure: POLYPECTOMY;  Surgeon: Jinny Carmine, MD;  Location: [facility];  Service: Endoscopy;;   SUBMUCOSAL INJECTION  01/17/2024   Procedure: SUBMUCOSAL INJECTION;  Surgeon: Maryruth Ole DASEN, MD;  Location: [facility] END[facility];  Service: Endoscopy;;   TONSILLECTOMY     TUBAL LIGATION  1991    Current Outpatient Medications  Medication Sig Dispense Refill   atorvastatin  (LIPITOR) 20 MG tablet Take 20 mg by mouth daily.     DULoxetine  (CYMBALTA ) 30 MG capsule Take 30 mg by mouth daily.  3   fluticasone  (FLONASE ) 50 MCG/ACT nasal spray Place 2 sprays into both nostrils daily. 16 g 6   gabapentin  (NEURONTIN ) 600 MG tablet Take 600 mg by mouth 2 (two) times daily.  3   glucose blood test strip 1 each by Other route 2 (two) times daily. 100 each 0   hydrochlorothiazide  (HYDRODIURIL ) 25 MG tablet Take 1 tablet (25 mg total) by mouth daily. 90 tablet 1   Lancets (ONETOUCH ULTRASOFT) lancets 1 each by Other route 2 (two) times daily. 100 each 0   lisinopril  (ZESTRIL ) 10 MG tablet Take 1 tablet (10 mg total) by mouth daily. 90 tablet 1   morphine  (MS CONTIN ) 15 MG 12 hr tablet Take 15 mg by mouth every 8 (eight) hours as needed for pain.  0   ondansetron  (ZOFRAN -ODT) 8 MG disintegrating tablet Take 1 tablet (8 mg total) by mouth every 8 (eight) hours as needed for nausea or vomiting. 20 tablet 0   Oxycodone  HCl 10 MG TABS Take 10 mg by mouth 4 (four) times daily as needed. Pain clinic     pantoprazole  (PROTONIX ) 40 MG tablet Take 1 tablet (40 mg total) by mouth 2 (two) times daily. 60 tablet 1   Probiotic Product (PROBIOTIC DAILY PO) Take 1 tablet by mouth daily.     SYNJARDY XR 12.04-999 MG TB24 Take 2 tablets by mouth daily. O'Connell     tiZANidine  (ZANAFLEX ) 4 MG tablet Take 4 mg by mouth  every 8 (eight) hours as needed for muscle spasms.  3   NARCAN  4 MG/0.1ML LIQD nasal spray kit Place 1 spray into the nose once. (Patient not taking: Reported on 10/11/2024)     No current facility-administered medications for this visit.     Allergies:   Patient has no known allergies.   Social History:  The patient  reports that she has been smoking cigarettes. She started smoking about 30 years ago. She has a 15.4 pack-year smoking history. She has never used smokeless tobacco. She reports that she does not drink alcohol and does not use drugs.   Family History:   family history includes Alcohol abuse in her father; Breast cancer in her paternal aunt; COPD in her father and sister; Cancer in her brother, brother, brother, brother, father, sister, and sister; Diabetes in her brother, brother, brother, brother, and daughter; Early death in her brother, mother, and sister; Hearing loss in her father; Heart disease in her brother and father; Hepatitis C in her sister; Hypertension in her brother, father, sister, and sister; Obesity in her brother, mother, sister, and  sister; Pulmonary embolism in her mother.    Review of Systems: Review of Systems  Constitutional: Negative.   HENT: Negative.    Respiratory: Negative.    Cardiovascular: Negative.   Gastrointestinal: Negative.   Musculoskeletal: Negative.   Neurological: Negative.   Psychiatric/Behavioral: Negative.    All other systems reviewed and are negative.   PHYSICAL EXAM: VS:  BP 130/70 (BP Location: Left Arm, Patient Position: Sitting, Cuff Size: Normal)   Pulse 72   Ht 5' 1 (1.549 m)   Wt 193 lb 12.8 oz (87.9 kg)   SpO2 96%   BMI 36.62 kg/m  , BMI Body mass index is 36.62 kg/m. GEN: Well nourished, well developed, in no acute distress HEENT: normal Neck: no JVD, carotid bruits, or masses Cardiac: RRR; no murmurs, rubs, or gallops, 2+ lower extremity edema bilaterally Respiratory:  clear to auscultation bilaterally,  normal work of breathing GI: soft, nontender, nondistended, + BS MS: no deformity or atrophy Skin: warm and dry, no rash Neuro:  Strength and sensation are intact Psych: euthymic mood, full affect   Recent Labs: 01/29/2024: ALT 5; BUN 7; Creatinine, Ser 0.71; Hemoglobin 10.3; Platelets 334; Potassium 4.5; Sodium 141    Lipid Panel Lab Results  Component Value Date   CHOL 165 04/13/2023   HDL 54 04/13/2023   LDLCALC 92 04/13/2023   TRIG 107 04/13/2023      Wt Readings from Last 3 Encounters:  10/11/24 193 lb 12.8 oz (87.9 kg)  09/08/24 191 lb (86.6 kg)  08/17/24 194 lb 4 oz (88.1 kg)     ASSESSMENT AND PLAN:  Problem List Items Addressed This Visit       Cardiology Problems   Aneurysm of right common iliac artery - Primary   Relevant Orders   EKG 12-Lead (Completed)   Essential hypertension   Relevant Orders   EKG 12-Lead (Completed)     Other   Type 2 diabetes mellitus with hyperglycemia, without long-term current use of insulin  (HCC)   Lower extremity edema   Tobacco dependence   Preop cardiovascular evaluation Acceptable risk for iliac artery surgery with endograft  stent placement No further cardiac testing needed Blood pressure well-controlled, low risk  Hyperlipidemia Reasonable control on Lipitor 20 daily Ideally total cholesterol should be less than 140, may need to add Zetia 10 mg  Essential hypertension Blood pressure is well controlled on today's visit. No changes made to the medications.  Diabetes type 2 A1c relatively well-controlled Low carbohydrate diet recommended Managed by primary care  Smoker Long history of smoking, reports that she stopped 4 months ago  Lower extremity edema/lymphedema Recommend compression hose, consideration of lymphedema compression pumps Suggest she talk with her vascular team after she has recovered from iliac surgery  Seen in consultation for Orvin Daring and will be referred back to her office for ongoing  care of the issues detailed above  Signed, Velinda Lunger, M.D., Ph.D. [facility] [place], [place] [phone]

## 2024-10-11 ENCOUNTER — Ambulatory Visit: Attending: Cardiovascular Disease | Admitting: Cardiovascular Disease

## 2024-10-11 ENCOUNTER — Encounter: Payer: Self-pay | Admitting: Cardiovascular Disease

## 2024-10-11 VITALS — BP 130/70 | HR 72 | Ht 61.0 in | Wt 193.8 lb

## 2024-10-11 DIAGNOSIS — E1165 Type 2 diabetes mellitus with hyperglycemia: Secondary | ICD-10-CM

## 2024-10-11 DIAGNOSIS — I723 Aneurysm of iliac artery: Secondary | ICD-10-CM | POA: Diagnosis not present

## 2024-10-11 DIAGNOSIS — R600 Localized edema: Secondary | ICD-10-CM | POA: Diagnosis not present

## 2024-10-11 DIAGNOSIS — F17200 Nicotine dependence, unspecified, uncomplicated: Secondary | ICD-10-CM | POA: Diagnosis not present

## 2024-10-11 DIAGNOSIS — I10 Essential (primary) hypertension: Secondary | ICD-10-CM | POA: Diagnosis not present

## 2024-10-11 NOTE — Patient Instructions (Addendum)

## 2024-10-12 ENCOUNTER — Telehealth (INDEPENDENT_AMBULATORY_CARE_PROVIDER_SITE_OTHER): Payer: Self-pay

## 2024-10-12 NOTE — Telephone Encounter (Signed)
 Spoke with the patient and she is scheduled for a AAA with Dr. Marea on 11/02/24 with a 6:45 am arrival to the [facility]. Pre-surgical instructions were discussed and will be sent to Mychart and mailed.

## 2024-10-13 DIAGNOSIS — M25562 Pain in left knee: Secondary | ICD-10-CM | POA: Diagnosis not present

## 2024-10-13 DIAGNOSIS — M4716 Other spondylosis with myelopathy, lumbar region: Secondary | ICD-10-CM | POA: Diagnosis not present

## 2024-10-13 DIAGNOSIS — Z79891 Long term (current) use of opiate analgesic: Secondary | ICD-10-CM | POA: Diagnosis not present

## 2024-10-13 DIAGNOSIS — M25561 Pain in right knee: Secondary | ICD-10-CM | POA: Diagnosis not present

## 2024-10-13 DIAGNOSIS — Z79899 Other long term (current) drug therapy: Secondary | ICD-10-CM | POA: Diagnosis not present

## 2024-10-13 DIAGNOSIS — G894 Chronic pain syndrome: Secondary | ICD-10-CM | POA: Diagnosis not present

## 2024-10-13 DIAGNOSIS — M5116 Intervertebral disc disorders with radiculopathy, lumbar region: Secondary | ICD-10-CM | POA: Diagnosis not present

## 2024-10-17 ENCOUNTER — Other Ambulatory Visit: Payer: Self-pay

## 2024-10-17 DIAGNOSIS — I10 Essential (primary) hypertension: Secondary | ICD-10-CM

## 2024-10-17 MED ORDER — LISINOPRIL 10 MG PO TABS: 10.0000 mg | ORAL_TABLET | Freq: Every day | ORAL | 1 refills | Status: AC

## 2024-10-17 MED ORDER — HYDROCHLOROTHIAZIDE 25 MG PO TABS: 25.0000 mg | ORAL_TABLET | Freq: Every day | ORAL | 1 refills | Status: AC

## 2024-10-25 ENCOUNTER — Other Ambulatory Visit

## 2024-10-26 ENCOUNTER — Other Ambulatory Visit (INDEPENDENT_AMBULATORY_CARE_PROVIDER_SITE_OTHER): Payer: Self-pay | Admitting: Nurse Practitioner

## 2024-10-26 DIAGNOSIS — I723 Aneurysm of iliac artery: Secondary | ICD-10-CM

## 2024-10-27 ENCOUNTER — Encounter
Admission: RE | Admit: 2024-10-27 | Discharge: 2024-10-27 | Disposition: A | Discharge status: Home / Self Care | Source: Ambulatory Visit | Attending: Vascular Surgery | Admitting: Vascular Surgery

## 2024-10-27 ENCOUNTER — Other Ambulatory Visit: Payer: Self-pay

## 2024-10-27 VITALS — BP 132/83 | HR 86 | Temp 98.5°F | Resp 16 | Ht 61.0 in | Wt 192.4 lb

## 2024-10-27 DIAGNOSIS — Z01812 Encounter for preprocedural laboratory examination: Secondary | ICD-10-CM | POA: Diagnosis not present

## 2024-10-27 DIAGNOSIS — E1165 Type 2 diabetes mellitus with hyperglycemia: Secondary | ICD-10-CM | POA: Insufficient documentation

## 2024-10-27 DIAGNOSIS — I723 Aneurysm of iliac artery: Secondary | ICD-10-CM | POA: Diagnosis not present

## 2024-10-27 DIAGNOSIS — R7689 Other specified abnormal immunological findings in serum: Secondary | ICD-10-CM

## 2024-10-27 HISTORY — DX: Localized edema: R60.0

## 2024-10-27 HISTORY — DX: Pain in right knee: M25.561

## 2024-10-27 HISTORY — DX: Intervertebral disc disorders with radiculopathy, lumbar region: M51.16

## 2024-10-27 HISTORY — DX: Nicotine dependence, unspecified, uncomplicated: F17.200

## 2024-10-27 HISTORY — DX: Aneurysm of iliac artery: I72.3

## 2024-10-27 HISTORY — DX: Long term (current) use of opiate analgesic: Z79.891

## 2024-10-27 HISTORY — DX: Pain in left knee: M25.562

## 2024-10-27 HISTORY — DX: Anemia, unspecified: D64.9

## 2024-10-27 HISTORY — DX: Chronic pain syndrome: G89.4

## 2024-10-27 HISTORY — DX: Other spondylosis with myelopathy, lumbar region: M47.16

## 2024-10-27 HISTORY — DX: Other specified abnormal immunological findings in serum: R76.89

## 2024-10-27 LAB — SURGICAL PCR SCREEN
MRSA, PCR: NEGATIVE
Staphylococcus aureus: NEGATIVE

## 2024-10-27 LAB — BASIC METABOLIC PANEL WITH GFR
Anion gap: 11 (ref 5–15)
BUN: 11 mg/dL (ref 8–23)
CO2: 28 mmol/L (ref 22–32)
Calcium: 9.8 mg/dL (ref 8.9–10.3)
Chloride: 100 mmol/L (ref 98–111)
Creatinine, Ser: 0.78 mg/dL (ref 0.44–1.00)
GFR, Estimated: >60 mL/min (ref >60)
Glucose, Bld: 103 mg/dL — ABNORMAL HIGH (ref 70–99)
Potassium: 3.8 mmol/L (ref 3.5–5.1)
Sodium: 140 mmol/L (ref 135–145)

## 2024-10-27 LAB — CBC WITH DIFFERENTIAL/PLATELET
Abs Immature Granulocytes: 0.01 K/uL (ref 0.00–0.07)
Basophils Absolute: 0 K/uL (ref 0.0–0.1)
Basophils Relative: 1 %
Eosinophils Absolute: 0.1 K/uL (ref 0.0–0.5)
Eosinophils Relative: 1 %
HCT: 40.1 % (ref 36.0–46.0)
Hemoglobin: 13.1 g/dL (ref 12.0–15.0)
Immature Granulocytes: 0 %
Lymphocytes Relative: 21 %
Lymphs Abs: 1.1 K/uL (ref 0.7–4.0)
MCH: 28.7 pg (ref 26.0–34.0)
MCHC: 32.7 g/dL (ref 30.0–36.0)
MCV: 87.9 fL (ref 80.0–100.0)
Monocytes Absolute: 0.5 K/uL (ref 0.1–1.0)
Monocytes Relative: 9 %
Neutro Abs: 3.5 K/uL (ref 1.7–7.7)
Neutrophils Relative %: 68 %
Platelets: 198 K/uL (ref 150–400)
RBC: 4.56 MIL/uL (ref 3.87–5.11)
RDW: 13.1 % (ref 11.5–15.5)
WBC: 5.2 K/uL (ref 4.0–10.5)
nRBC: 0 % (ref 0.0–0.2)

## 2024-10-27 NOTE — Patient Instructions (Addendum)
 Your procedure is scheduled on:Wednesday 11/02/24 Report to the Registration Desk on the 1st floor of the [facility]. To find out your arrival time, please call [phone] between 1PM - 3PM on: Tuesday 11/01/24 If your arrival time is 6:00 am, do not arrive before that time as the [facility] entrance doors do not open until 6:00 am.  REMEMBER: Instructions that are not followed completely may result in serious medical risk, up to and including death; or upon the discretion of your surgeon and anesthesiologist your surgery may need to be rescheduled.  Do not eat food or drink fluids after midnight the night before surgery.  No gum chewing or hard candies.   One week prior to surgery: Stop Anti-inflammatories (NSAIDS) such as Advil, Aleve, Ibuprofen, Motrin, Naproxen, Naprosyn and Aspirin based products such as Excedrin, Goody's Powder, BC Powder. Stop ANY OVER THE COUNTER supplements until after surgery.  You may however, continue to take Tylenol  if needed for pain up until the day of surgery.  Stop SYNJARDY 3 days prior to surgery. (Take last dose Saturday 10/29/24)  Continue taking all of your other prescription medications up until the day of surgery.  ON THE DAY OF SURGERY ONLY TAKE THESE MEDICATIONS WITH SIPS OF WATER :  DULoxetine  (CYMBALTA ) 30 MG  gabapentin  (NEURONTIN ) 600 MG  pantoprazole  (PROTONIX ) 40 MG   Use inhalers on the day of surgery and bring to the hospital. fluticasone  (FLONASE ) 50 MCG/ACT nasal spray (if needed)   No Alcohol for 24 hours before or after surgery.  No Smoking including e-cigarettes for 24 hours before surgery.  No chewable tobacco products for at least 6 hours before surgery.  No nicotine  patches on the day of surgery.  Do not use any recreational drugs for at least a week (preferably 2 weeks) before your surgery.  Please be advised that the combination of cocaine and anesthesia may have negative outcomes, up to and including  death. If you test positive for cocaine, your surgery will be cancelled.  On the morning of surgery brush your teeth with toothpaste and water , you may rinse your mouth with mouthwash if you wish. Do not swallow any toothpaste or mouthwash.  Use CHG Soap or wipes as directed on instruction sheet.  Do not wear jewelry, make-up, hairpins, clips or nail polish.  For welded (permanent) jewelry: bracelets, anklets, waist bands, etc.  Please have this removed prior to surgery.  If it is not removed, there is a chance that hospital personnel will need to cut it off on the day of surgery.  Do not wear lotions, powders, or perfumes.   Do not shave body hair from the neck down 48 hours before surgery.  Contact lenses, hearing aids and dentures may not be worn into surgery.  Do not bring valuables to the hospital. [facility] is not responsible for any missing/lost belongings or valuables.   Notify your doctor if there is any change in your medical condition (cold, fever, infection).  Wear comfortable clothing (specific to your surgery type) to the hospital.  After surgery, you can help prevent lung complications by doing breathing exercises.  Take deep breaths and cough every 1-2 hours. Your doctor may order a device called an Incentive Spirometer to help you take deep breaths. When coughing or sneezing, hold a pillow firmly against your incision with both hands. This is called "splinting." Doing this helps protect your incision. It also decreases belly discomfort.  If you are being admitted to the hospital overnight, leave  your suitcase in the car. After surgery it may be brought to your room.  In case of increased patient census, it may be necessary for you, the patient, to continue your postoperative care in the Same Day Surgery department.  If you are being discharged the day of surgery, you will not be allowed to drive home. You will need a responsible individual to drive you home and  stay with you for 24 hours after surgery.   If you are taking public transportation, you will need to have a responsible individual with you.  Please call the Pre-admissions Testing Dept. at [phone] if you have any questions about these instructions.  Surgery Visitation Policy:  Patients having surgery or a procedure may have two visitors.  Children under the age of 28 must have an adult with them who is not the patient.  Inpatient Visitation:    Visiting hours are 7 a.m. to 8 p.m. Up to four visitors are allowed at one time in a patient room. The visitors may rotate out with other people during the day.  One visitor age 54 or older may stay with the patient overnight and must be in the room by 8 p.m.   Merchandiser, Retail to address health-related social needs:  [email or web address]                                                                                                            Preparing for Surgery with CHLORHEXIDINE GLUCONATE (CHG) Soap  Chlorhexidine Gluconate (CHG) Soap  o An antiseptic cleaner that kills germs and bonds with the skin to continue killing germs even after washing  o Used for showering the night before surgery and morning of surgery  Before surgery, you can play an important role by reducing the number of germs on your skin.  CHG (Chlorhexidine gluconate) soap is an antiseptic cleanser which kills germs and bonds with the skin to continue killing germs even after washing.  Please do not use if you have an allergy to CHG or antibacterial soaps. If your skin becomes reddened/irritated stop using the CHG.  1. Shower the NIGHT BEFORE SURGERY with CHG soap.  2. If you choose to wash your hair, wash your hair first as usual with your normal shampoo.  3. After shampooing, rinse your hair and body thoroughly to remove the shampoo.  4. Use CHG as you would any other liquid soap. You can apply CHG directly to the skin and wash  gently with a clean washcloth.  5. Apply the CHG soap to your body only from the neck down. Do not use on open wounds or open sores. Avoid contact with your eyes, ears, mouth, and genitals (private parts). Wash face and genitals (private parts) with your normal soap.  6. Wash thoroughly, paying special attention to the area where your surgery will be performed.  7. Thoroughly rinse your body with warm water .  8. Do not shower/wash with your normal soap after using and rinsing off the CHG soap.  9. Do not use lotions,  oils, etc., after showering with CHG.  10. Pat yourself dry with a clean towel.  11. Wear clean pajamas to bed the night before surgery.  12. Place clean sheets on your bed the night of your shower and do not sleep with pets.  13. Do not apply any deodorants/lotions/powders.  14. Please wear clean clothes to the hospital.  15. Remember to brush your teeth with your regular toothpaste.

## 2024-10-28 ENCOUNTER — Encounter: Payer: Self-pay | Admitting: Vascular Surgery

## 2024-10-28 ENCOUNTER — Encounter: Payer: Self-pay | Admitting: Student

## 2024-10-28 ENCOUNTER — Inpatient Hospital Stay: Admission: RE | Admit: 2024-10-28 | Source: Ambulatory Visit

## 2024-10-28 ENCOUNTER — Ambulatory Visit: Admitting: Student

## 2024-10-28 VITALS — BP 120/78 | HR 86 | Ht 61.0 in | Wt 193.0 lb

## 2024-10-28 DIAGNOSIS — I10 Essential (primary) hypertension: Secondary | ICD-10-CM

## 2024-10-28 DIAGNOSIS — E782 Mixed hyperlipidemia: Secondary | ICD-10-CM | POA: Diagnosis not present

## 2024-10-28 DIAGNOSIS — I723 Aneurysm of iliac artery: Secondary | ICD-10-CM

## 2024-10-28 DIAGNOSIS — R600 Localized edema: Secondary | ICD-10-CM | POA: Diagnosis not present

## 2024-10-28 DIAGNOSIS — Z7984 Long term (current) use of oral hypoglycemic drugs: Secondary | ICD-10-CM

## 2024-10-28 DIAGNOSIS — E1165 Type 2 diabetes mellitus with hyperglycemia: Secondary | ICD-10-CM

## 2024-10-28 DIAGNOSIS — K269 Duodenal ulcer, unspecified as acute or chronic, without hemorrhage or perforation: Secondary | ICD-10-CM | POA: Diagnosis not present

## 2024-10-28 DIAGNOSIS — G894 Chronic pain syndrome: Secondary | ICD-10-CM

## 2024-10-28 DIAGNOSIS — E785 Hyperlipidemia, unspecified: Secondary | ICD-10-CM | POA: Insufficient documentation

## 2024-10-28 NOTE — Assessment & Plan Note (Addendum)
 Currently on atorvastatin  20 mg daily and doing well on this. LDL 80 and t choelsterol 165. Cardiology suggested goal t chol <145 on 04/26/2024. Will check lipid panel at next visit and discuss adjusting dose of atorvastatin  then. Continue atorvastatin  20 mg daily.

## 2024-10-28 NOTE — Assessment & Plan Note (Addendum)
 Oozing duodenal ucler on EGD 01/2024, negative h Pylori 07/24/2024. Currently on pantoprazole  40 mg daily, and following with GI. Is avoiding NSAIDs. No longer her having bloody or maroon stools. F/u with GI.

## 2024-10-28 NOTE — Assessment & Plan Note (Signed)
 Last A1c 5.8% in May, on [place]. Sees endocrinology at [facility].

## 2024-10-28 NOTE — Assessment & Plan Note (Addendum)
 Continue with elevation, and compression stockings.

## 2024-10-28 NOTE — Assessment & Plan Note (Signed)
 Currently on lisinopril  10 mg daily daily and hydrochlorothiazide  25 mg daily. Stable BMP from 10/27/2024. Continue current medications.

## 2024-10-28 NOTE — Assessment & Plan Note (Signed)
 Scheduled for endovascular repair on 11/19 with vascular surgery

## 2024-10-28 NOTE — Assessment & Plan Note (Addendum)
 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

## 2024-10-28 NOTE — Progress Notes (Signed)
 Established Patient Office Visit  Subjective   Patient ID: Erica Ruiz, female    DOB: 06/12/1961  Age: 63 y.o. MRN: [id number]  Chief Complaint  Patient presents with   Hypertension    Erica Ruiz is a 63 y.o. person with medical hx listed below who presents today for transfer of care. Feeling well today. Has upcoming surgery on 11/19 with vascular surgery for R iliac artery aneurysm. Please refer to problem based charting for further details and assessment and plan of current problem and chronic medical conditions.   Patient Active Problem List   Diagnosis Date Noted   HLD (hyperlipidemia) 10/28/2024   Aneurysm of right common iliac artery 01/23/2024   Duodenal ulcer 01/21/2024   Lower extremity edema 01/16/2024   Chronic pain syndrome 01/16/2024   Polyp of transverse colon 10/13/2023   Type 2 diabetes mellitus with hyperglycemia, without long-term current use of insulin  (HCC) 09/17/2018   Tobacco dependence 09/17/2018   Special screening for malignant neoplasms, colon    Benign neoplasm of transverse colon    Essential hypertension 04/16/2018      ROS Refer to HPI    Objective:     Outpatient Encounter Medications as of 10/28/2024  Medication Sig Note   atorvastatin  (LIPITOR) 20 MG tablet Take 20 mg by mouth daily.    DULoxetine  (CYMBALTA ) 30 MG capsule Take 30 mg by mouth daily.    fluticasone  (FLONASE ) 50 MCG/ACT nasal spray Place 2 sprays into both nostrils daily.    gabapentin  (NEURONTIN ) 600 MG tablet Take 600 mg by mouth 2 (two) times daily.    glucose blood test strip 1 each by Other route 2 (two) times daily. 08/06/2020: One touch   hydrochlorothiazide  (HYDRODIURIL ) 25 MG tablet Take 1 tablet (25 mg total) by mouth daily.    Lancets (ONETOUCH ULTRASOFT) lancets 1 each by Other route 2 (two) times daily.    lisinopril  (ZESTRIL ) 10 MG tablet Take 1 tablet (10 mg total) by mouth daily.    morphine  (MS CONTIN ) 15 MG 12 hr tablet Take 15 mg by mouth  every 8 (eight) hours as needed for pain.    NARCAN  4 MG/0.1ML LIQD nasal spray kit Place 1 spray into the nose once.    Oxycodone  HCl 10 MG TABS Take 10 mg by mouth 4 (four) times daily as needed. Pain clinic    pantoprazole  (PROTONIX ) 40 MG tablet Take 1 tablet (40 mg total) by mouth 2 (two) times daily.    Probiotic Product (PROBIOTIC DAILY PO) Take 1 tablet by mouth daily.    SYNJARDY XR 12.04-999 MG TB24 Take 2 tablets by mouth daily. [place]    tiZANidine  (ZANAFLEX ) 4 MG tablet Take 4 mg by mouth every 8 (eight) hours as needed for muscle spasms.    [DISCONTINUED] ondansetron  (ZOFRAN -ODT) 8 MG disintegrating tablet Take 1 tablet (8 mg total) by mouth every 8 (eight) hours as needed for nausea or vomiting. (Patient not taking: Reported on 10/28/2024)    No facility-administered encounter medications on file as of 10/28/2024.    BP 120/78   Pulse 86   Ht 5' 1 (1.549 m)   Wt 193 lb (87.5 kg)   SpO2 95%   BMI 36.47 kg/m  BP Readings from Last 3 Encounters:  10/28/24 120/78  10/27/24 132/83  10/11/24 130/70    Physical Exam Constitutional:      Appearance: Normal appearance.  HENT:     Head: Normocephalic and atraumatic.  Cardiovascular:  Rate and Rhythm: Normal rate and regular rhythm.  Pulmonary:     Effort: Pulmonary effort is normal.     Breath sounds: No rhonchi or rales.  Abdominal:     General: Abdomen is flat. Bowel sounds are normal. There is no distension.     Palpations: Abdomen is soft.     Tenderness: There is no abdominal tenderness.  Musculoskeletal:        General: Normal range of motion.     Right lower leg: Edema (2+, lower third of the leg) present.     Left lower leg: Edema (2+, lower third of the leg) present.  Skin:    General: Skin is warm and dry.     Capillary Refill: Capillary refill takes less than 2 seconds.  Neurological:     General: No focal deficit present.     Mental Status: She is alert and oriented to person, place, and time.   Psychiatric:        Mood and Affect: Mood normal.        Behavior: Behavior normal.        10/28/2024    2:49 PM 03/17/2024    1:22 PM 10/08/2023    3:41 PM  Depression screen PHQ 2/9  Decreased Interest 0 0 0  Down, Depressed, Hopeless 0 0 0  PHQ - 2 Score 0 0 0  Altered sleeping  0 2  Tired, decreased energy  0 1  Change in appetite  0 0  Feeling bad or failure about yourself   0 0  Trouble concentrating  0 0  Moving slowly or fidgety/restless  0 0  Suicidal thoughts  0 0  PHQ-9 Score  0  3   Difficult doing work/chores  Not difficult at all Not difficult at all     Data saved with a previous flowsheet row definition       10/28/2024    2:49 PM 03/17/2024    1:22 PM 10/08/2023    3:41 PM 04/13/2023    2:02 PM  GAD 7 : Generalized Anxiety Score  Nervous, Anxious, on Edge 0 0 0   Control/stop worrying 0 0 0 0  Worry too much - different things  0 0 0  Trouble relaxing  0 0 0  Restless  0 0 0  Easily annoyed or irritable  0 0 0  Afraid - awful might happen  0 0 0  Total GAD 7 Score  0 0   Anxiety Difficulty  Not difficult at all Not difficult at all Not difficult at all    Results for orders placed or performed in visit on 10/28/24  Microalbumin, urine  Result Value Ref Range   Microalb, Ur 19   Microalbumin / creatinine urine ratio  Result Value Ref Range   Microalb Creat Ratio <30   Protein / creatinine ratio, urine  Result Value Ref Range   Albumin, U 174.7   Hemoglobin A1c  Result Value Ref Range   Hemoglobin A1C 5.8   HM DIABETES FOOT EXAM  Result Value Ref Range   HM Diabetic Foot Exam Normal     Last CBC Lab Results  Component Value Date   WBC 5.2 10/27/2024   HGB 13.1 10/27/2024   HCT 40.1 10/27/2024   MCV 87.9 10/27/2024   MCH 28.7 10/27/2024   RDW 13.1 10/27/2024   PLT 198 10/27/2024   Last metabolic panel Lab Results  Component Value Date   GLUCOSE 103 (H) 10/27/2024  NA 140 10/27/2024   K 3.8 10/27/2024   CL 100 10/27/2024    CO2 28 10/27/2024   BUN 11 10/27/2024   CREATININE 0.78 10/27/2024   GFRNONAA >60 10/27/2024   CALCIUM  9.8 10/27/2024   PHOS 4.7 (H) 04/10/2022   PROT 5.8 (L) 01/29/2024   ALBUMIN 3.8 (L) 01/29/2024   LABGLOB 2.0 01/29/2024   AGRATIO 1.6 04/13/2023   BILITOT <0.2 01/29/2024   ALKPHOS 49 01/29/2024   AST 7 01/29/2024   ALT 5 01/29/2024   ANIONGAP 11 10/27/2024   Last lipids Lab Results  Component Value Date   CHOL 165 04/13/2023   HDL 54 04/13/2023   LDLCALC 92 04/13/2023   LDLDIRECT 97 10/10/2022   TRIG 107 04/13/2023   CHOLHDL 3.3 07/15/2021   Last hemoglobin A1c Lab Results  Component Value Date   HGBA1C 5.8 04/26/2024      The 10-year ASCVD risk score (Arnett DK, et al., 2019) is: 8.1%    Assessment & Plan:  Essential hypertension Assessment & Plan: Currently on lisinopril  10 mg daily daily and hydrochlorothiazide  25 mg daily. Stable BMP from 10/27/2024. Continue current medications.    Mixed hyperlipidemia Assessment & Plan: Currently on atorvastatin  20 mg daily and doing well on this. LDL 80 and t choelsterol 165. Cardiology suggested goal t chol <145 on 04/26/2024. Will check lipid panel at next visit and discuss adjusting dose of atorvastatin  then. Continue atorvastatin  20 mg daily.    Duodenal ulcer Assessment & Plan: Oozing duodenal ucler on EGD 01/2024, negative h Pylori 07/24/2024. Currently on pantoprazole  40 mg daily, and following with GI. Is avoiding NSAIDs. No longer her having bloody or maroon stools. F/u with GI.   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    Lower extremity edema Assessment & Plan: Continue with elevation, and compression stockings.    Aneurysm of right common iliac artery Assessment &  Plan: Scheduled for endovascular repair on 11/19 with vascular surgery   Type 2 diabetes mellitus with hyperglycemia, without long-term current use of insulin  [facility]) Assessment & Plan: Last A1c 5.8% in May, on [place]. Sees endocrinology at [facility].      Return in about 6 months (around 04/27/2025) for physical.    Harlene Saddler, MD

## 2024-10-31 ENCOUNTER — Inpatient Hospital Stay: Admission: RE | Admit: 2024-10-31 | Source: Ambulatory Visit

## 2024-10-31 ENCOUNTER — Encounter
Admission: RE | Admit: 2024-10-31 | Discharge: 2024-10-31 | Disposition: A | Discharge status: Home / Self Care | Source: Ambulatory Visit | Attending: Vascular Surgery | Admitting: Vascular Surgery

## 2024-11-02 ENCOUNTER — Encounter: Payer: Self-pay | Admitting: Vascular Surgery

## 2024-11-02 ENCOUNTER — Encounter
Admission: RE | Disposition: A | Discharge status: Home / Self Care | Payer: Self-pay | Source: Home / Self Care | Attending: Vascular Surgery

## 2024-11-02 ENCOUNTER — Other Ambulatory Visit: Payer: Self-pay

## 2024-11-02 ENCOUNTER — Inpatient Hospital Stay: Payer: Self-pay | Admitting: Urgent Care

## 2024-11-02 ENCOUNTER — Inpatient Hospital Stay
Admission: RE | Admit: 2024-11-02 | Discharge: 2024-11-03 | DRG: 272 | Disposition: A | Discharge status: Home / Self Care | Attending: Vascular Surgery | Admitting: Vascular Surgery

## 2024-11-02 DIAGNOSIS — I700 Atherosclerosis of aorta: Secondary | ICD-10-CM | POA: Diagnosis present

## 2024-11-02 DIAGNOSIS — Z79891 Long term (current) use of opiate analgesic: Secondary | ICD-10-CM

## 2024-11-02 DIAGNOSIS — G894 Chronic pain syndrome: Secondary | ICD-10-CM | POA: Diagnosis present

## 2024-11-02 DIAGNOSIS — Z832 Family history of diseases of the blood and blood-forming organs and certain disorders involving the immune mechanism: Secondary | ICD-10-CM | POA: Diagnosis not present

## 2024-11-02 DIAGNOSIS — Z8249 Family history of ischemic heart disease and other diseases of the circulatory system: Secondary | ICD-10-CM

## 2024-11-02 DIAGNOSIS — Z833 Family history of diabetes mellitus: Secondary | ICD-10-CM | POA: Diagnosis not present

## 2024-11-02 DIAGNOSIS — Z87891 Personal history of nicotine dependence: Secondary | ICD-10-CM | POA: Diagnosis not present

## 2024-11-02 DIAGNOSIS — E119 Type 2 diabetes mellitus without complications: Secondary | ICD-10-CM | POA: Diagnosis present

## 2024-11-02 DIAGNOSIS — Z822 Family history of deafness and hearing loss: Secondary | ICD-10-CM

## 2024-11-02 DIAGNOSIS — I723 Aneurysm of iliac artery: Secondary | ICD-10-CM | POA: Diagnosis not present

## 2024-11-02 DIAGNOSIS — Z79899 Other long term (current) drug therapy: Secondary | ICD-10-CM

## 2024-11-02 DIAGNOSIS — K219 Gastro-esophageal reflux disease without esophagitis: Secondary | ICD-10-CM | POA: Diagnosis present

## 2024-11-02 DIAGNOSIS — Z825 Family history of asthma and other chronic lower respiratory diseases: Secondary | ICD-10-CM | POA: Diagnosis not present

## 2024-11-02 DIAGNOSIS — E1165 Type 2 diabetes mellitus with hyperglycemia: Secondary | ICD-10-CM

## 2024-11-02 DIAGNOSIS — Z95828 Presence of other vascular implants and grafts: Secondary | ICD-10-CM | POA: Diagnosis not present

## 2024-11-02 DIAGNOSIS — I10 Essential (primary) hypertension: Secondary | ICD-10-CM | POA: Diagnosis present

## 2024-11-02 DIAGNOSIS — Z9889 Other specified postprocedural states: Secondary | ICD-10-CM | POA: Diagnosis not present

## 2024-11-02 HISTORY — PX: ENDOVASCULAR STENT GRAFT (AAA): CATH118280

## 2024-11-02 HISTORY — DX: Long term (current) use of opiate analgesic: Z79.891

## 2024-11-02 HISTORY — DX: Atherosclerosis of aorta: I70.0

## 2024-11-02 HISTORY — DX: Type 2 diabetes mellitus without complications: E11.9

## 2024-11-02 LAB — GLUCOSE, CAPILLARY
Glucose-Capillary: 113 mg/dL — ABNORMAL HIGH (ref 70–99)
Glucose-Capillary: 129 mg/dL — ABNORMAL HIGH (ref 70–99)
Glucose-Capillary: 154 mg/dL — ABNORMAL HIGH (ref 70–99)
Glucose-Capillary: 180 mg/dL — ABNORMAL HIGH (ref 70–99)
Glucose-Capillary: 196 mg/dL — ABNORMAL HIGH (ref 70–99)

## 2024-11-02 LAB — HEMOGLOBIN A1C
Hgb A1c MFr Bld: 6.3 % — ABNORMAL HIGH (ref 4.8–5.6)
Mean Plasma Glucose: 134.11 mg/dL

## 2024-11-02 SURGERY — ENDOVASCULAR STENT GRAFT (AAA)
Anesthesia: General

## 2024-11-02 MED ORDER — FENTANYL CITRATE (PF) 100 MCG/2ML IJ SOLN
INTRAMUSCULAR | Status: DC | PRN
  Administered 2024-11-02: 25 ug via INTRAVENOUS
  Administered 2024-11-02 (×3): 50 ug via INTRAVENOUS
  Administered 2024-11-02: 25 ug via INTRAVENOUS

## 2024-11-02 MED ORDER — METOPROLOL TARTRATE 5 MG/5ML IV SOLN: 2.5000 mg | INTRAVENOUS | Status: DC | PRN

## 2024-11-02 MED ORDER — CHLORHEXIDINE GLUCONATE 0.12 % MT SOLN
15.0000 mL | Freq: Once | OROMUCOSAL | Status: AC
  Administered 2024-11-02: 15 mL via OROMUCOSAL
  Filled 2024-11-02: qty 15

## 2024-11-02 MED ORDER — MORPHINE SULFATE ER 15 MG PO TBCR
15.0000 mg | EXTENDED_RELEASE_TABLET | Freq: Two times a day (BID) | ORAL | Status: DC
Start: 2024-11-02 — End: 2024-11-03
  Administered 2024-11-02 – 2024-11-03 (×3): 15 mg via ORAL
  Filled 2024-11-02 (×3): qty 1

## 2024-11-02 MED ORDER — ENOXAPARIN SODIUM 40 MG/0.4ML IJ SOSY
40.0000 mg | PREFILLED_SYRINGE | INTRAMUSCULAR | Status: DC
  Administered 2024-11-03: 40 mg via SUBCUTANEOUS
  Filled 2024-11-02: qty 0.4

## 2024-11-02 MED ORDER — LISINOPRIL 5 MG PO TABS
10.0000 mg | ORAL_TABLET | Freq: Every day | ORAL | Status: DC
  Administered 2024-11-02 – 2024-11-03 (×2): 10 mg via ORAL
  Filled 2024-11-02 (×2): qty 2

## 2024-11-02 MED ORDER — CHLORHEXIDINE GLUCONATE CLOTH 2 % EX PADS
6.0000 | MEDICATED_PAD | Freq: Every day | CUTANEOUS | Status: DC
  Administered 2024-11-02 – 2024-11-03 (×2): 6 via TOPICAL

## 2024-11-02 MED ORDER — HYDROMORPHONE HCL 1 MG/ML IJ SOLN: 1.0000 mg | Freq: Once | INTRAMUSCULAR | Status: DC | PRN

## 2024-11-02 MED ORDER — PROPOFOL 10 MG/ML IV BOLUS
INTRAVENOUS | Status: DC | PRN
  Administered 2024-11-02: 150 mg via INTRAVENOUS
  Administered 2024-11-02: 30 mg via INTRAVENOUS

## 2024-11-02 MED ORDER — MIDAZOLAM HCL (PF) 2 MG/2ML IJ SOLN
INTRAMUSCULAR | Status: DC | PRN
Start: 2024-11-02 — End: 2024-11-02
  Administered 2024-11-02: 2 mg via INTRAVENOUS

## 2024-11-02 MED ORDER — ROCURONIUM BROMIDE 100 MG/10ML IV SOLN
INTRAVENOUS | Status: DC | PRN
  Administered 2024-11-02: 50 mg via INTRAVENOUS

## 2024-11-02 MED ORDER — FENTANYL CITRATE (PF) 100 MCG/2ML IJ SOLN
INTRAMUSCULAR | Status: AC
  Filled 2024-11-02: qty 2

## 2024-11-02 MED ORDER — MIDAZOLAM HCL 2 MG/2ML IJ SOLN
INTRAMUSCULAR | Status: AC
  Filled 2024-11-02: qty 2

## 2024-11-02 MED ORDER — OXYCODONE HCL 5 MG PO TABS
10.0000 mg | ORAL_TABLET | Freq: Four times a day (QID) | ORAL | Status: DC | PRN
  Administered 2024-11-02 – 2024-11-03 (×3): 10 mg via ORAL
  Filled 2024-11-02 (×3): qty 2

## 2024-11-02 MED ORDER — MORPHINE SULFATE (PF) 2 MG/ML IV SOLN: 2.0000 mg | INTRAVENOUS | Status: DC | PRN

## 2024-11-02 MED ORDER — CEFAZOLIN SODIUM-DEXTROSE 2-4 GM/100ML-% IV SOLN
2.0000 g | Freq: Three times a day (TID) | INTRAVENOUS | Status: AC
  Administered 2024-11-02 – 2024-11-03 (×2): 2 g via INTRAVENOUS
  Filled 2024-11-02 (×3): qty 100

## 2024-11-02 MED ORDER — DEXAMETHASONE SOD PHOSPHATE PF 10 MG/ML IJ SOLN
INTRAMUSCULAR | Status: DC | PRN
  Administered 2024-11-02: 10 mg via INTRAVENOUS

## 2024-11-02 MED ORDER — DOCUSATE SODIUM 100 MG PO CAPS
100.0000 mg | ORAL_CAPSULE | Freq: Every day | ORAL | Status: DC
  Administered 2024-11-03: 100 mg via ORAL
  Filled 2024-11-02: qty 1

## 2024-11-02 MED ORDER — CLOPIDOGREL BISULFATE 75 MG PO TABS
75.0000 mg | ORAL_TABLET | Freq: Every day | ORAL | Status: DC
  Administered 2024-11-03: 75 mg via ORAL
  Filled 2024-11-02: qty 1

## 2024-11-02 MED ORDER — SODIUM CHLORIDE 0.9 % IV SOLN: INTRAVENOUS | Status: DC

## 2024-11-02 MED ORDER — ACETAMINOPHEN 325 MG PO TABS
325.0000 mg | ORAL_TABLET | ORAL | Status: DC | PRN
  Administered 2024-11-03: 650 mg via ORAL
  Filled 2024-11-02: qty 2

## 2024-11-02 MED ORDER — OXYCODONE HCL 5 MG PO TABS: 5.0000 mg | ORAL_TABLET | Freq: Once | ORAL | Status: DC | PRN

## 2024-11-02 MED ORDER — ASPIRIN 81 MG PO TBEC
81.0000 mg | DELAYED_RELEASE_TABLET | Freq: Every day | ORAL | Status: DC
  Administered 2024-11-03: 81 mg via ORAL
  Filled 2024-11-02: qty 1

## 2024-11-02 MED ORDER — IODIXANOL 320 MG/ML IV SOLN
INTRAVENOUS | Status: DC | PRN
Start: 2024-11-02 — End: 2024-11-02
  Administered 2024-11-02: 50 mL

## 2024-11-02 MED ORDER — PHENYLEPHRINE 80 MCG/ML (10ML) SYRINGE FOR IV PUSH (FOR BLOOD PRESSURE SUPPORT)
PREFILLED_SYRINGE | INTRAVENOUS | Status: AC
  Filled 2024-11-02: qty 10

## 2024-11-02 MED ORDER — ATORVASTATIN CALCIUM 20 MG PO TABS
20.0000 mg | ORAL_TABLET | Freq: Every day | ORAL | Status: DC
  Administered 2024-11-02 – 2024-11-03 (×2): 20 mg via ORAL
  Filled 2024-11-02 (×2): qty 1

## 2024-11-02 MED ORDER — INSULIN ASPART 100 UNIT/ML IJ SOLN: 0.0000 [IU] | Freq: Every day | INTRAMUSCULAR | Status: DC

## 2024-11-02 MED ORDER — GLYCOPYRROLATE 0.2 MG/ML IJ SOLN
INTRAMUSCULAR | Status: AC
  Filled 2024-11-02: qty 1

## 2024-11-02 MED ORDER — POLYETHYLENE GLYCOL 3350 17 G PO PACK: 17.0000 g | PACK | Freq: Every day | ORAL | Status: DC | PRN

## 2024-11-02 MED ORDER — PANTOPRAZOLE SODIUM 40 MG PO TBEC
40.0000 mg | DELAYED_RELEASE_TABLET | Freq: Two times a day (BID) | ORAL | Status: DC
  Administered 2024-11-02 – 2024-11-03 (×2): 40 mg via ORAL
  Filled 2024-11-02 (×2): qty 1

## 2024-11-02 MED ORDER — LIDOCAINE HCL (PF) 2 % IJ SOLN
INTRAMUSCULAR | Status: AC
  Filled 2024-11-02: qty 5

## 2024-11-02 MED ORDER — CEFAZOLIN SODIUM-DEXTROSE 2-4 GM/100ML-% IV SOLN
INTRAVENOUS | Status: AC
  Filled 2024-11-02: qty 100

## 2024-11-02 MED ORDER — ONDANSETRON HCL 4 MG/2ML IJ SOLN: 4.0000 mg | Freq: Once | INTRAMUSCULAR | Status: DC | PRN

## 2024-11-02 MED ORDER — FENTANYL CITRATE (PF) 100 MCG/2ML IJ SOLN
25.0000 ug | INTRAMUSCULAR | Status: DC | PRN
  Administered 2024-11-02 (×4): 25 ug via INTRAVENOUS

## 2024-11-02 MED ORDER — HEPARIN SODIUM (PORCINE) 1000 UNIT/ML IJ SOLN
INTRAMUSCULAR | Status: AC
  Filled 2024-11-02: qty 10

## 2024-11-02 MED ORDER — HYDRALAZINE HCL 20 MG/ML IJ SOLN: 5.0000 mg | INTRAMUSCULAR | Status: DC | PRN

## 2024-11-02 MED ORDER — INSULIN ASPART 100 UNIT/ML IJ SOLN
0.0000 [IU] | Freq: Three times a day (TID) | INTRAMUSCULAR | Status: DC
  Administered 2024-11-02 (×2): 3 [IU] via SUBCUTANEOUS
  Filled 2024-11-02 (×2): qty 3

## 2024-11-02 MED ORDER — TIZANIDINE HCL 4 MG PO TABS
4.0000 mg | ORAL_TABLET | Freq: Three times a day (TID) | ORAL | Status: DC | PRN
  Administered 2024-11-03: 4 mg via ORAL
  Filled 2024-11-02 (×2): qty 1

## 2024-11-02 MED ORDER — PROPOFOL 10 MG/ML IV BOLUS
INTRAVENOUS | Status: AC
  Filled 2024-11-02: qty 40

## 2024-11-02 MED ORDER — ACETAMINOPHEN 325 MG RE SUPP: 325.0000 mg | RECTAL | Status: DC | PRN

## 2024-11-02 MED ORDER — HEPARIN SODIUM (PORCINE) 1000 UNIT/ML IJ SOLN
INTRAMUSCULAR | Status: DC | PRN
  Administered 2024-11-02: 6000 [IU] via INTRAVENOUS

## 2024-11-02 MED ORDER — CHLORHEXIDINE GLUCONATE CLOTH 2 % EX PADS
6.0000 | MEDICATED_PAD | Freq: Once | CUTANEOUS | Status: AC
  Administered 2024-11-02: 6 via TOPICAL

## 2024-11-02 MED ORDER — LACTATED RINGERS IV SOLN
INTRAVENOUS | Status: DC | PRN
Start: 2024-11-02 — End: 2024-11-02

## 2024-11-02 MED ORDER — ROCURONIUM BROMIDE 10 MG/ML (PF) SYRINGE
PREFILLED_SYRINGE | INTRAVENOUS | Status: AC
  Filled 2024-11-02: qty 10

## 2024-11-02 MED ORDER — OXYCODONE HCL 5 MG/5ML PO SOLN: 5.0000 mg | Freq: Once | ORAL | Status: DC | PRN

## 2024-11-02 MED ORDER — PHENYLEPHRINE HCL-NACL 20-0.9 MG/250ML-% IV SOLN
INTRAVENOUS | Status: AC
  Filled 2024-11-02: qty 250

## 2024-11-02 MED ORDER — ORAL CARE MOUTH RINSE: 15.0000 mL | Freq: Once | OROMUCOSAL | Status: AC

## 2024-11-02 MED ORDER — GABAPENTIN 300 MG PO CAPS
600.0000 mg | ORAL_CAPSULE | Freq: Two times a day (BID) | ORAL | Status: DC
  Administered 2024-11-02 – 2024-11-03 (×2): 600 mg via ORAL
  Filled 2024-11-02 (×2): qty 2

## 2024-11-02 MED ORDER — RISAQUAD PO CAPS
1.0000 | ORAL_CAPSULE | Freq: Every day | ORAL | Status: DC
  Administered 2024-11-03: 1 via ORAL
  Filled 2024-11-02: qty 1

## 2024-11-02 MED ORDER — HEPARIN (PORCINE) IN NACL 1000-0.9 UT/500ML-% IV SOLN
INTRAVENOUS | Status: DC | PRN
  Administered 2024-11-02: 1500 mL

## 2024-11-02 MED ORDER — POTASSIUM CHLORIDE CRYS ER 20 MEQ PO TBCR: 40.0000 meq | EXTENDED_RELEASE_TABLET | Freq: Every day | ORAL | Status: DC | PRN

## 2024-11-02 MED ORDER — LIDOCAINE HCL (CARDIAC) PF 100 MG/5ML IV SOSY
PREFILLED_SYRINGE | INTRAVENOUS | Status: DC | PRN
  Administered 2024-11-02: 100 mg via INTRAVENOUS

## 2024-11-02 MED ORDER — BISACODYL 10 MG RE SUPP: 10.0000 mg | Freq: Every day | RECTAL | Status: DC | PRN

## 2024-11-02 MED ORDER — SUGAMMADEX SODIUM 200 MG/2ML IV SOLN
INTRAVENOUS | Status: DC | PRN
  Administered 2024-11-02: 200 mg via INTRAVENOUS

## 2024-11-02 MED ORDER — LABETALOL HCL 5 MG/ML IV SOLN: 10.0000 mg | INTRAVENOUS | Status: DC | PRN

## 2024-11-02 MED ORDER — PHENOL 1.4 % MT LIQD
1.0000 | OROMUCOSAL | Status: DC | PRN
Start: 2024-11-02 — End: 2024-11-03

## 2024-11-02 MED ORDER — CEFAZOLIN SODIUM-DEXTROSE 2-4 GM/100ML-% IV SOLN
2.0000 g | INTRAVENOUS | Status: AC
  Administered 2024-11-02: 2 g via INTRAVENOUS

## 2024-11-02 MED ORDER — ONDANSETRON HCL 4 MG/2ML IJ SOLN
INTRAMUSCULAR | Status: AC
  Filled 2024-11-02: qty 2

## 2024-11-02 MED ORDER — ONDANSETRON HCL 4 MG/2ML IJ SOLN
4.0000 mg | Freq: Four times a day (QID) | INTRAMUSCULAR | Status: DC | PRN
  Administered 2024-11-02: 4 mg via INTRAVENOUS

## 2024-11-02 MED ORDER — DULOXETINE HCL 30 MG PO CPEP
30.0000 mg | ORAL_CAPSULE | Freq: Every day | ORAL | Status: DC
  Administered 2024-11-03: 30 mg via ORAL
  Filled 2024-11-02: qty 1

## 2024-11-02 SURGICAL SUPPLY — 37 items
BALLOON ATG 12X6X80 (BALLOONS) IMPLANT
BALLOON DORADO 10X40X80 (BALLOONS) IMPLANT
CATH ACCU-VU SIZ PIG 5F 70CM (CATHETERS) IMPLANT
CATH BEACON 5 .035 65 KMP TIP (CATHETERS) IMPLANT
CATH BEACON 5 .035 65 RIM TIP (CATHETERS) IMPLANT
CATH MICROCATH PRGRT 2.8F 110 (CATHETERS) IMPLANT
CATH VS15FR (CATHETERS) IMPLANT
CLOSURE PERCLOSE PROSTYLE (Vascular Products) IMPLANT
COIL 400 COMPLEX SOFT 8X35CM (Vascular Products) IMPLANT
COVER PROBE ULTRASOUND 5X96 (MISCELLANEOUS) IMPLANT
DERMABOND ADVANCED .7 DNX12 (GAUZE/BANDAGES/DRESSINGS) IMPLANT
DEVICE OCCLUSION POD8 (Vascular Products) IMPLANT
DEVICE OCCLUSION PODJ45 (Vascular Products) IMPLANT
DEVICE PRESTO INFLATION (MISCELLANEOUS) IMPLANT
DEVICE SAFEGUARD 24CM (GAUZE/BANDAGES/DRESSINGS) IMPLANT
DEVICE STARCLOSE SE CLOSURE (Vascular Products) IMPLANT
DEVICE TORQUE (MISCELLANEOUS) IMPLANT
EXTENDER ENDOPROSTHESIS 10X7 (Endovascular Graft) IMPLANT
EXTENDER ENDOPROSTHESIS 12X7 (Endovascular Graft) IMPLANT
GLIDEWIRE ANGLED SS 035X260CM (WIRE) IMPLANT
GLIDEWIRE STIFF .35X180X3 HYDR (WIRE) IMPLANT
HANDLE DETACHMENT COIL (MISCELLANEOUS) IMPLANT
NDL ENTRY 21GA 7CM ECHOTIP (NEEDLE) IMPLANT
NEEDLE ENTRY 21GA 7CM ECHOTIP (NEEDLE) IMPLANT
PACK ANGIOGRAPHY (CUSTOM PROCEDURE TRAY) ×1 IMPLANT
PACK BASIN MAJOR ARMC (MISCELLANEOUS) IMPLANT
SET INTRO CAPELLA COAXIAL (SET/KITS/TRAYS/PACK) IMPLANT
SHEATH BRITE TIP 5FRX11 (SHEATH) IMPLANT
SHEATH BRITE TIP 6FRX11 (SHEATH) IMPLANT
SHEATH BRITE TIP 8FRX11 (SHEATH) IMPLANT
SHEATH DRYSEAL FLEX 12FR 33CM (SHEATH) IMPLANT
SUT MNCRL AB 4-0 PS2 18 (SUTURE) IMPLANT
SYR MEDRAD MARK 7 150ML (SYRINGE) IMPLANT
TRAY FOLEY SLVR 16FR LF STAT (SET/KITS/TRAYS/PACK) IMPLANT
TUBING CONTRAST HIGH PRESS 72 (TUBING) IMPLANT
WIRE AMPLATZ SSTIFF .035X260CM (WIRE) IMPLANT
WIRE J 3MM .035X145CM (WIRE) IMPLANT

## 2024-11-02 NOTE — Plan of Care (Signed)
  Problem: Education: Goal: Knowledge of General Education information will improve Description: Including pain rating scale, medication(s)/side effects and non-pharmacologic comfort measures Outcome: Progressing   Problem: Health Behavior/Discharge Planning: Goal: Ability to manage health-related needs will improve Outcome: Progressing   Problem: Clinical Measurements: Goal: Ability to maintain clinical measurements within normal limits will improve Outcome: Progressing Goal: Will remain free from infection Outcome: Progressing Goal: Diagnostic test results will improve Outcome: Progressing Goal: Respiratory complications will improve Outcome: Progressing Goal: Cardiovascular complication will be avoided Outcome: Progressing   Problem: Activity: Goal: Risk for activity intolerance will decrease Outcome: Progressing   Problem: Nutrition: Goal: Adequate nutrition will be maintained Outcome: Progressing   Problem: Coping: Goal: Level of anxiety will decrease Outcome: Progressing   Problem: Elimination: Goal: Will not experience complications related to bowel motility Outcome: Progressing Goal: Will not experience complications related to urinary retention Outcome: Progressing   Problem: Pain Managment: Goal: General experience of comfort will improve and/or be controlled Outcome: Progressing   Problem: Safety: Goal: Ability to remain free from injury will improve Outcome: Progressing   Problem: Skin Integrity: Goal: Risk for impaired skin integrity will decrease Outcome: Progressing   Problem: Education: Goal: Ability to describe self-care measures that may prevent or decrease complications (Diabetes Survival Skills Education) will improve Outcome: Progressing Goal: Individualized Educational Video(s) Outcome: Progressing   Problem: Coping: Goal: Ability to adjust to condition or change in health will improve Outcome: Progressing   Problem: Fluid  Volume: Goal: Ability to maintain a balanced intake and output will improve Outcome: Progressing   Problem: Health Behavior/Discharge Planning: Goal: Ability to identify and utilize available resources and services will improve Outcome: Progressing Goal: Ability to manage health-related needs will improve Outcome: Progressing   Problem: Metabolic: Goal: Ability to maintain appropriate glucose levels will improve Outcome: Progressing   Problem: Nutritional: Goal: Maintenance of adequate nutrition will improve Outcome: Progressing Goal: Progress toward achieving an optimal weight will improve Outcome: Progressing   Problem: Skin Integrity: Goal: Risk for impaired skin integrity will decrease Outcome: Progressing   Problem: Tissue Perfusion: Goal: Adequacy of tissue perfusion will improve Outcome: Progressing   Problem: Education: Goal: Knowledge of discharge needs will improve Outcome: Progressing   Problem: Clinical Measurements: Goal: Postoperative complications will be avoided or minimized Outcome: Progressing   Problem: Respiratory: Goal: Will achieve and/or maintain a regular respiratory rate, without signs or symptoms of dyspnea Outcome: Progressing   Problem: Skin Integrity: Goal: Demonstration of wound healing without infection will improve Outcome: Progressing

## 2024-11-02 NOTE — Progress Notes (Signed)
 MRN : [id number]  Erica Ruiz is a 63 y.o. ([birth year]) female who presents with chief complaint of check circulation.  History of Present Illness:   Patient presents to [facility] today for treatment of her aneurysmal disease.  She was last seen in the office September 08, 2024.  She was initially seen for anincidental finding of a right common iliac artery aneurysm. She initially presented to the hospital with hematochezia. Following her hospitalization she has not had any further issues. However during her hospitalization it was found that she had a 1.8 cm common iliac artery aneurysm in February 2025. Today, she returns today for repeat imaging of her aneurysm. Initially it was felt to be that it is possibly mycotic in nature however the patient has not had any resolving infection such as sepsis. She was septic about 15 to 20 years ago. She denies any abdominal pain. No signs symptoms of distal embolization. Her previous ultrasound showed AAA 2.4 cm aneurysm. Given the concern for rapid growth she underwent CT scan which has been independently reviewed by myself as well as Ms. Orvin Daring and it was noted that the patient had a 2.6 cm aneurysm which would be considered a rapid rate from her previous studies.   Current Meds  Medication Sig   atorvastatin  (LIPITOR) 20 MG tablet Take 20 mg by mouth daily.   DULoxetine  (CYMBALTA ) 30 MG capsule Take 30 mg by mouth daily.   fluticasone  (FLONASE ) 50 MCG/ACT nasal spray Place 2 sprays into both nostrils daily.   gabapentin  (NEURONTIN ) 600 MG tablet Take 600 mg by mouth 2 (two) times daily.   hydrochlorothiazide  (HYDRODIURIL ) 25 MG tablet Take 1 tablet (25 mg total) by mouth daily.   lisinopril  (ZESTRIL ) 10 MG tablet Take 1 tablet (10 mg total) by mouth daily.   morphine  (MS CONTIN ) 15 MG 12 hr tablet Take 15 mg by mouth every 8 (eight) hours as needed  for pain.   Oxycodone  HCl 10 MG TABS Take 10 mg by mouth 4 (four) times daily as needed. Pain clinic   pantoprazole  (PROTONIX ) 40 MG tablet Take 1 tablet (40 mg total) by mouth 2 (two) times daily.   tiZANidine  (ZANAFLEX ) 4 MG tablet Take 4 mg by mouth every 8 (eight) hours as needed for muscle spasms.    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   a.) preoperative T&S (+) for: warm autoantibody (C Ag,  e Ag, P1 Ag, Kidd A, M Ag)   T2DM (type 2 diabetes mellitus) (HCC)    Tobacco dependence     Past Surgical History:  Procedure Laterality Date   BREAST BIOPSY Right    neg bx/clip   CHOLECYSTECTOMY  unsure   COLON SURGERY  unsure   COLONOSCOPY WITH PROPOFOL  N/A 08/23/2018   Procedure: COLONOSCOPY WITH PROPOFOL  with biopsies;  Surgeon: Jinny Carmine, MD;  Location: [facility];  Service: Endoscopy;  Laterality: N/A;  DIABETIC   COLONOSCOPY WITH PROPOFOL  N/A 10/13/2023   Procedure: COLONOSCOPY WITH BIOPSY;  Surgeon: Jinny Carmine, MD;  Location: [facility];  Service: Endoscopy;  Laterality: N/A;  Diabetic   COLOSTOMY REVERSAL     ESOPHAGOGASTRODUODENOSCOPY (EGD) WITH PROPOFOL  N/A 01/17/2024   Procedure: ESOPHAGOGASTRODUODENOSCOPY (EGD) WITH PROPOFOL ;  Surgeon: Maryruth Ole DASEN, MD;  Location: [facility];  Service: Endoscopy;  Laterality: N/A;   ESOPHAGOGASTRODUODENOSCOPY (EGD) WITH PROPOFOL  N/A 01/21/2024   Procedure:  ESOPHAGOGASTRODUODENOSCOPY (EGD) WITH PROPOFOL ;  Surgeon: Jinny Carmine, MD;  Location: [facility];  Service: Endoscopy;  Laterality: N/A;   HEMOSTASIS CLIP PLACEMENT  01/17/2024   Procedure: HEMOSTASIS CLIP PLACEMENT;  Surgeon: Maryruth Ole DASEN, MD;  Location: [facility];  Service: Endoscopy;;   HEMOSTASIS CONTROL  01/17/2024   Procedure: HEMOSTASIS CONTROL;  Surgeon: Maryruth Ole DASEN, MD;  Location: [facility];  Service: Endoscopy;;   HOT HEMOSTASIS  01/21/2024   Procedure: HOT HEMOSTASIS (ARGON PLASMA COAGULATION/BICAP);  Surgeon: Jinny Carmine, MD;  Location: [facility];  Service: Endoscopy;;   KNEE SURGERY Right    torn meniscus   POLYPECTOMY N/A 08/23/2018   Procedure: POLYPECTOMY;  Surgeon: Jinny Carmine, MD;  Location: [facility];  Service: Endoscopy;  Laterality: N/A;   POLYPECTOMY  10/13/2023   Procedure: POLYPECTOMY;  Surgeon: Jinny Carmine, MD;  Location: [facility];  Service: Endoscopy;;   SUBMUCOSAL INJECTION  01/17/2024   Procedure: SUBMUCOSAL INJECTION;  Surgeon: Maryruth Ole DASEN, MD;  Location: [facility];  Service: Endoscopy;;   TONSILLECTOMY     as a child   TUBAL LIGATION  1991    Social History Social History   Tobacco Use   Smoking status: Former    Current packs/day: 0.00    Types: Cigarettes    Quit date: 1995    Years since quitting: 30.9   Smokeless tobacco: Never  Vaping Use   Vaping status: Never Used  Substance Use Topics   Alcohol use: Never   Drug use: Never    Family History Family History  Problem Relation Age of Onset   Pulmonary embolism Mother    Early death Mother    Obesity Mother    Cancer Father        prostate   Heart disease Father    Hypertension Father    Alcohol abuse Father    COPD Father    Hearing loss Father    Cancer Sister        non-hodgkin's lymphoma   Hepatitis C Sister    Hypertension Sister    Cancer Sister    Early death Sister    Obesity Sister    COPD Sister     Hypertension Sister    Obesity Sister    Cancer Brother        colon   Diabetes Brother    Heart disease Brother    Hypertension Brother    Diabetes Brother    Cancer Brother        prostate   Cancer  Brother    Diabetes Brother    Early death Brother    Cancer Brother    Obesity Brother    Diabetes Brother    Breast cancer Paternal Aunt    Diabetes Daughter     No Known Allergies   REVIEW OF SYSTEMS (Negative unless checked)  Constitutional: [] Weight loss  [] Fever  [] Chills Cardiac: [] Chest pain   [] Chest pressure   [] Palpitations   [] Shortness of breath when laying flat   [] Shortness of breath with exertion. Vascular:  [x] Pain in legs with walking   [] Pain in legs at rest  [] History of DVT   [] Phlebitis   [] Swelling in legs   [] Varicose veins   [] Non-healing ulcers Pulmonary:   [] Uses home oxygen   [] Productive cough   [] Hemoptysis   [] Wheeze  [] COPD   [] Asthma Neurologic:  [] Dizziness   [] Seizures   [] History of stroke   [] History of TIA  [] Aphasia   [] Vissual changes   [] Weakness or numbness in arm   [] Weakness or numbness in leg Musculoskeletal:   [] Joint swelling   [] Joint pain   [] Low back pain Hematologic:  [] Easy bruising  [] Easy bleeding   [] Hypercoagulable state   [] Anemic Gastrointestinal:  [] Diarrhea   [] Vomiting  [] Gastroesophageal reflux/heartburn   [] Difficulty swallowing. Genitourinary:  [] Chronic kidney disease   [] Difficult urination  [] Frequent urination   [] Blood in urine Skin:  [] Rashes   [] Ulcers  Psychological:  [] History of anxiety   []  History of major depression.  Physical Examination  Vitals:   11/02/24 0717  BP: 100/65  Pulse: 69  Resp: 12  Temp: 98.2 F (36.8 C)  TempSrc: Oral  SpO2: 95%  Weight: 88.9 kg  Height: 5' 1 (1.549 m)   Body mass index is 37.03 kg/m. Gen: WD/WN, NAD Head: [place]/AT, No temporalis wasting.  Ear/Nose/Throat: Hearing grossly intact, nares w/o erythema or drainage Eyes: PER, EOMI, sclera nonicteric.  Neck:  Supple, no masses.  No bruit or JVD.  Pulmonary:  Good air movement, no audible wheezing, no use of accessory muscles.  Cardiac: RRR, normal S1, S2, no Murmurs. Vascular:  mild trophic changes, no open wounds Vessel Right Left  Radial Palpable Palpable  Gastrointestinal: soft, non-distended. No guarding/no peritoneal signs.  Musculoskeletal: M/S 5/5 throughout.  No visible deformity.  Neurologic: CN 2-12 intact. Pain and light touch intact in extremities.  Symmetrical.  Speech is fluent. Motor exam as listed above. Psychiatric: Judgment intact, Mood & affect appropriate for pt's clinical situation. Dermatologic: No rashes or ulcers noted.  No changes consistent with cellulitis.   CBC Lab Results  Component Value Date   WBC 5.2 10/27/2024   HGB 13.1 10/27/2024   HCT 40.1 10/27/2024   MCV 87.9 10/27/2024   PLT 198 10/27/2024    BMET    Component Value Date/Time   NA 140 10/27/2024 1404   NA 141 01/29/2024 1419   K 3.8 10/27/2024 1404   CL 100 10/27/2024 1404   CO2 28 10/27/2024 1404   GLUCOSE 103 (H) 10/27/2024 1404   BUN 11 10/27/2024 1404   BUN 7 (L) 01/29/2024 1419   CREATININE 0.78 10/27/2024 1404   CALCIUM  9.8 10/27/2024 1404   GFRNONAA >60 10/27/2024 1404   GFRAA 106 10/14/2018 0942   Estimated Creatinine Clearance: 73.9 mL/min (by C-G formula based on SCr of 0.78 mg/dL).  COAG Lab Results  Component Value Date   INR 1.1 01/20/2024   INR 1.0 01/16/2024    Radiology No results found.   Assessment/Plan 1.  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  And therefore should undergo repair. Patient is status post CT scan of the abdominal aorta and iliac artery aneurysm.  The initial scan in February had an iliac artery aneurysm measured at 1.8 cm that her most recent was an aneurysm measured at 2.6 cm.  Base on evaluation of the CT scan by me the  patient is a candidate for endovascular repair.  Additionally, the patient will require embolization of the right internal iliac artery to gain control of her aneurysmal growth and prevent endoleak.     The patient will require cardiac clearance prior to stent graft placement.    The patient will continue antiplatelet therapy as prescribed (since the patient is undergoing endovascular repair as opposed to open repair) as well as aggressive management of hyperlipidemia. Exercise is encouraged.    The patient is reminded that lifetime routine surveillance is a necessity with an endograft.    The risks and benefits of AAA repair are reviewed with the patient.  All questions are answered.  Alternative therapies are also discussed.  The patient agrees to proceed with endovascular aneurysm repair to prevent lethal rupture.   Patient will follow-up with me in the office after the surgery.   2. Essential hypertension Continue antihypertensive medications as already ordered, these medications have been reviewed and there are no changes at this time.   3. Tobacco dependence Smoking cessation was discussed, 3-10 minutes spent on this topic specifically   Cordella Shawl, MD  11/02/2024 7:31 AM

## 2024-11-02 NOTE — Anesthesia Procedure Notes (Signed)
 Procedure Name: Intubation Date/Time: 11/02/2024 8:37 AM  Performed by: Carley Pac, RNPre-anesthesia Checklist: Patient identified, Emergency Drugs available, Suction available and Patient being monitored Patient Re-evaluated:Patient Re-evaluated prior to induction Oxygen Delivery Method: Circle system utilized Preoxygenation: Pre-oxygenation with 100% oxygen Induction Type: IV induction Ventilation: Mask ventilation without difficulty Laryngoscope Size: McGrath and 3 Grade View: Grade I Tube type: Oral Tube size: 7.0 mm Number of attempts: 1 Airway Equipment and Method: Stylet and Oral airway Placement Confirmation: ETT inserted through vocal cords under direct vision, positive ETCO2 and breath sounds checked- equal and bilateral Secured at: 21 cm Tube secured with: Tape Dental Injury: Teeth and Oropharynx as per pre-operative assessment

## 2024-11-02 NOTE — Op Note (Signed)
 OPERATIVE NOTE   PROCEDURE: US  guidance for vascular access, bilateral femoral arteries Catheter placement into aorta from bilateral femoral approaches Catheter placement into the right internal iliac artery from right femoral approach with selective imaging Coil embolization of the right internal iliac artery with a total of 3 Ruby coils Placement of a iliac to iliac endograft in the right common iliac artery using a 16 mm diameter proximal 12 mm diameter distal 7 cm Gore excluder right iliac limb Placement of a extension stent into the right external iliac artery using a 16 mm diameter proximal by 10 mm diameter 7 cm long Gore excluder right limb to get beyond the aneurysm and into the right external iliac artery ProGlide closure device right femoral artery StarClose closure device left femoral artery  PRE-OPERATIVE DIAGNOSIS: Rapidly enlarging right iliac artery aneurysm  POST-OPERATIVE DIAGNOSIS: same  SURGEON: Selinda Gu, MD and Cordella Shawl, MD - Co-surgeons  ANESTHESIA: General  ESTIMATED BLOOD LOSS: 10 cc  FINDING(S): 1.  Large right iliac artery aneurysm requiring coil embolization of the right internal iliac artery and extension down to the mid right external iliac artery  SPECIMEN(S):  none  INDICATIONS:   Erica Ruiz is a 63 y.o. female who presents with an enlarging right iliac artery aneurysm. The anatomy was suitable for endovascular repair.  Risks and benefits of repair in an endovascular fashion were discussed and informed consent was obtained. Co-surgeons are used to expedite the procedure and reduce operative time as bilateral work needs to be done.  DESCRIPTION: After obtaining full informed written consent, the patient was brought back to the operating room and placed supine upon the operating table.  The patient received IV antibiotics prior to induction.  After obtaining adequate anesthesia, the patient was prepped and draped in the standard fashion  for endovascular AAA repair.  We then began by gaining access to both femoral arteries with US  guidance with me working on the left and Dr. Shawl working on the right.  The femoral arteries were found to be patent and accessed without difficulty with a needle under ultrasound guidance without difficulty on each side and permanent images were recorded.  We then placed 2 proglide devices on the right side in a pre-close fashion and placed 8 French sheaths.  A 5 French sheath was placed on the left side.  The patient was then given 6000 units of intravenous heparin. The Pigtail catheter was placed into the aorta from the left side.  Imaging through the right femoral sheath was performed as well as an aortogram with the pigtail catheter in the aorta from the left side.  Dr. Shawl then cannulated the right internal iliac artery and selective imaging was performed with the PS1 catheter.  He then advanced a Progreat microcatheter out and perform coil embolization of the right internal iliac artery.  We started with an 8 pod coil and then used a 45 cm packing coil.  Finally, an 8 mm diameter by 35 cm length soft coil was placed in the proximal right internal iliac artery.  Completion imaging showed successful coil embolization of the right internal iliac artery.  We then continued in LAO and caudal projection which showed the origin of the right common iliac artery where a proximal seal would be obtained.  Dr. Shawl upsized to a 12 French sheath on the right side and we initially selected a 16 mm proximal, 12 mm distal, 7 cm long iliac limb.  This was deployed down to the  iliac bifurcation with the top of the stent graft within a couple millimeters of the aortic bifurcation.  This did not have adequate seal distally, and an additional 16 mm diameter by 10 cm length 7 cm long iliac limb was deployed into the right mid external iliac artery.  These were postdilated with a 14 mm balloon proximally and a 10 mm balloon  distally.  Completion imaging through the pigtail catheter showed the stent grafts now to be widely patent.  There was successful occlusion of the internal iliac artery.  There was no apparent endoleak on completion imaging.  At this point we elected to terminate the procedure. We secured the pro glide devices for hemostasis on the right femoral artery.  A StarClose closure device was deployed on the left femoral artery. The skin incision was closed with a 4-0 Monocryl. Dermabond and pressure dressing were placed. The patient was taken to the recovery room in stable condition having tolerated the procedure well.  COMPLICATIONS: none  CONDITION: stable  Selinda Gu  11/02/2024, 9:59 AM   This note was created with Dragon Medical transcription system. Any errors in dictation are purely unintentional.

## 2024-11-02 NOTE — Interval H&P Note (Signed)
 History and Physical Interval Note:  11/02/2024 7:34 AM  Erica Ruiz  has presented today for surgery, with the diagnosis of AAA  R Internal Iliac    GORE   ANESTHESIA     Iliac artery aneurysm.  The various methods of treatment have been discussed with the patient and family. After consideration of risks, benefits and other options for treatment, the patient has consented to  Procedure(s): ENDOVASCULAR STENT GRAFT (AAA) (N/A) as a surgical intervention.  The patient's history has been reviewed, patient examined, no change in status, stable for surgery.  I have reviewed the patient's chart and labs.  Questions were answered to the patient's satisfaction.     Cordella Shawl

## 2024-11-02 NOTE — Op Note (Signed)
 OPERATIVE NOTE   PROCEDURE: US  guidance for vascular access, bilateral femoral arteries Catheter placement into aorta from bilateral femoral approaches Placement of a right 16 x 12 Gore Excluder Endoprosthesis iliac limb with a 16 x 10 iliac extender ipsilateral limb for seal ProGlide closure devices right common femoral artery with a StarClose device left. Coil embolization of the right internal iliac artery with 3 penumbra Ruby coils  PRE-OPERATIVE DIAGNOSIS: Right common iliac artery aneurysm  POST-OPERATIVE DIAGNOSIS: same  SURGEON: Cordella Shawl, MD and Selinda Gu, MD - Co-surgeons  ANESTHESIA: general  ESTIMATED BLOOD LOSS: 50 cc  FINDING(S): 1.  Large right common iliac artery aneurysm  SPECIMEN(S):  none  INDICATIONS:   Erica Ruiz is a 63 y.o. y.o. female who presents with presents with a right iliac artery aneurysm that is greater than 3 cm placing her at risk for lethal rupture.  The anatomy is suitable for endovascular repair.  Risks and benefits for repair of the right common iliac artery aneurysm using an endograft was described in detail to the patient all questions have been answered patient agrees to proceed.  Co-surgeons are utilized to expedite the procedure and reduce operative time improving patient's safety and improving outcome.  DESCRIPTION: After obtaining full informed written consent, the patient was brought back to the operating room and placed supine upon the operating table.  The patient received IV antibiotics prior to induction.  After obtaining adequate anesthesia, the patient was prepped and draped in the standard fashion for endovascular right iliac artery repair.  Co-surgeons are required because this is a complex procedure with work being performed simultaneously from both the right femoral and left femoral approach.  This also expedites the procedure making a shorter operative time reducing complications and improving patient safety.  We  then began by gaining access to both femoral arteries with US  guidance with me working on the patient's right and Dr. Gu working on the patient's left.  The femoral arteries were found to be patent and accessed without difficulty with a needle under ultrasound guidance without difficulty on each side and permanent images were recorded.  We then placed 2 proglide devices on the right in a pre-close fashion and placed 8 French sheath.  On the left we placed a 5 French sheath in the usual fashion  The patient was then given 6000 units of intravenous heparin .   Hand-injection of the right iliac system through the right 8 French sheath was then performed in an LAO caudal projection.  This localized the origin of the internal iliac artery.  A V S1 catheter was then advanced up the right side.  It was reformed in the iliac artery aneurysm and then used to select the right internal iliac artery.  Hand-injection contrast verified this.  A Progreat was then advanced out to the third order where magnified imaging by hand-injection was performed.  After review of these images the Progreat was repositioned and a Ruby Pat 860 cm coil was deployed followed by a 45 cm packing coil and then an 8 mm x 35 cm soft coil.  Follow-up imaging demonstrated essentially occlusion of the internal iliac artery, successful coil embolization and the V S1 catheter was removed over a J-wire.  A Kumpe catheter was then advanced up into the descending thoracic aorta and the J-wire exchanged for an Amplatz Super Stiff wire.  A 12 French sheath was then advanced and positioned with its tip at the aortic bifurcation.  The Pigtail catheter was placed  into the aorta from the left side. Using this image, we selected a 16 x 12 Gore iliac limb.  This was deployed with its leading edge at the origin of the right common iliac extending distally.  Unfortunately this limb did not provide adequate seal distally.  In order to successfully exclude the  aneurysmal disease an limb was required and a 16 x 10 was  extension opened onto the field advanced up the right side and then deployed overlapping the initial stent by 3 cm.  A 14 x 60 Atlas balloon was then advanced across the proximal portion of the stent and inflated to 4 to 6 atm.  A 10 x 40 balloon was then used to treat the distal portion of the stent and the overlap segment again inflations were to 4 atm.  The pigtail catheter was then used and a completion angiogram was performed.   No endoleak was detected on completion angiography.   At this point we elected to terminate the procedure. We secured the pro glide device for hemostasis on the right femoral artery and a StarClose was deployed on the left. The skin incision was closed with a 4-0 Monocryl. Dermabond and pressure dressing were placed. The patient was taken to the recovery room in stable condition having tolerated the procedure well.  COMPLICATIONS: none  CONDITION: stable  Cordella Shawl  11/02/2024, 10:01 AM

## 2024-11-02 NOTE — Anesthesia Postprocedure Evaluation (Signed)
 Anesthesia Post Note  Patient: CHANTEE CERINO  Procedure(s) Performed: ENDOVASCULAR STENT GRAFT (AAA)  Patient location during evaluation: PACU Anesthesia Type: General Level of consciousness: awake and alert Pain management: pain level controlled Vital Signs Assessment: post-procedure vital signs reviewed and stable Respiratory status: spontaneous breathing, nonlabored ventilation, respiratory function stable and patient connected to nasal cannula oxygen Cardiovascular status: blood pressure returned to baseline and stable Postop Assessment: no apparent nausea or vomiting Anesthetic complications: no   There were no known notable events for this encounter.   Last Vitals:  Vitals:   11/02/24 1045 11/02/24 1100  BP: 135/81 (!) 143/83  Pulse: 72 73  Resp: 16 15  Temp:  36.8 C  SpO2: 95% 95%    Last Pain:  Vitals:   11/02/24 1100  TempSrc:   PainSc: Asleep                 Windell Farr

## 2024-11-02 NOTE — H&P (View-Only) (Signed)
 MRN : [id number]  Erica Ruiz is a 63 y.o. ([birth year]) female who presents with chief complaint of check circulation.  History of Present Illness:   Patient presents to [facility] today for treatment of her aneurysmal disease.  She was last seen in the office September 08, 2024.  She was initially seen for anincidental finding of a right common iliac artery aneurysm. She initially presented to the hospital with hematochezia. Following her hospitalization she has not had any further issues. However during her hospitalization it was found that she had a 1.8 cm common iliac artery aneurysm in February 2025. Today, she returns today for repeat imaging of her aneurysm. Initially it was felt to be that it is possibly mycotic in nature however the patient has not had any resolving infection such as sepsis. She was septic about 15 to 20 years ago. She denies any abdominal pain. No signs symptoms of distal embolization. Her previous ultrasound showed AAA 2.4 cm aneurysm. Given the concern for rapid growth she underwent CT scan which has been independently reviewed by myself as well as Ms. Orvin Daring and it was noted that the patient had a 2.6 cm aneurysm which would be considered a rapid rate from her previous studies.   Current Meds  Medication Sig   atorvastatin  (LIPITOR) 20 MG tablet Take 20 mg by mouth daily.   DULoxetine  (CYMBALTA ) 30 MG capsule Take 30 mg by mouth daily.   fluticasone  (FLONASE ) 50 MCG/ACT nasal spray Place 2 sprays into both nostrils daily.   gabapentin  (NEURONTIN ) 600 MG tablet Take 600 mg by mouth 2 (two) times daily.   hydrochlorothiazide  (HYDRODIURIL ) 25 MG tablet Take 1 tablet (25 mg total) by mouth daily.   lisinopril  (ZESTRIL ) 10 MG tablet Take 1 tablet (10 mg total) by mouth daily.   morphine  (MS CONTIN ) 15 MG 12 hr tablet Take 15 mg by mouth every 8 (eight) hours as needed  for pain.   Oxycodone  HCl 10 MG TABS Take 10 mg by mouth 4 (four) times daily as needed. Pain clinic   pantoprazole  (PROTONIX ) 40 MG tablet Take 1 tablet (40 mg total) by mouth 2 (two) times daily.   tiZANidine  (ZANAFLEX ) 4 MG tablet Take 4 mg by mouth every 8 (eight) hours as needed for muscle spasms.    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   a.) preoperative T&S (+) for: warm autoantibody (C Ag,  e Ag, P1 Ag, Kidd A, M Ag)   T2DM (type 2 diabetes mellitus) (HCC)    Tobacco dependence     Past Surgical History:  Procedure Laterality Date   BREAST BIOPSY Right    neg bx/clip   CHOLECYSTECTOMY  unsure   COLON SURGERY  unsure   COLONOSCOPY WITH PROPOFOL  N/A 08/23/2018   Procedure: COLONOSCOPY WITH PROPOFOL  with biopsies;  Surgeon: Jinny Carmine, MD;  Location: [facility];  Service: Endoscopy;  Laterality: N/A;  DIABETIC   COLONOSCOPY WITH PROPOFOL  N/A 10/13/2023   Procedure: COLONOSCOPY WITH BIOPSY;  Surgeon: Jinny Carmine, MD;  Location: [facility];  Service: Endoscopy;  Laterality: N/A;  Diabetic   COLOSTOMY REVERSAL     ESOPHAGOGASTRODUODENOSCOPY (EGD) WITH PROPOFOL  N/A 01/17/2024   Procedure: ESOPHAGOGASTRODUODENOSCOPY (EGD) WITH PROPOFOL ;  Surgeon: Maryruth Ole DASEN, MD;  Location: [facility];  Service: Endoscopy;  Laterality: N/A;   ESOPHAGOGASTRODUODENOSCOPY (EGD) WITH PROPOFOL  N/A 01/21/2024   Procedure:  ESOPHAGOGASTRODUODENOSCOPY (EGD) WITH PROPOFOL ;  Surgeon: Jinny Carmine, MD;  Location: [facility];  Service: Endoscopy;  Laterality: N/A;   HEMOSTASIS CLIP PLACEMENT  01/17/2024   Procedure: HEMOSTASIS CLIP PLACEMENT;  Surgeon: Maryruth Ole DASEN, MD;  Location: [facility];  Service: Endoscopy;;   HEMOSTASIS CONTROL  01/17/2024   Procedure: HEMOSTASIS CONTROL;  Surgeon: Maryruth Ole DASEN, MD;  Location: [facility];  Service: Endoscopy;;   HOT HEMOSTASIS  01/21/2024   Procedure: HOT HEMOSTASIS (ARGON PLASMA COAGULATION/BICAP);  Surgeon: Jinny Carmine, MD;  Location: [facility];  Service: Endoscopy;;   KNEE SURGERY Right    torn meniscus   POLYPECTOMY N/A 08/23/2018   Procedure: POLYPECTOMY;  Surgeon: Jinny Carmine, MD;  Location: [facility];  Service: Endoscopy;  Laterality: N/A;   POLYPECTOMY  10/13/2023   Procedure: POLYPECTOMY;  Surgeon: Jinny Carmine, MD;  Location: [facility];  Service: Endoscopy;;   SUBMUCOSAL INJECTION  01/17/2024   Procedure: SUBMUCOSAL INJECTION;  Surgeon: Maryruth Ole DASEN, MD;  Location: [facility];  Service: Endoscopy;;   TONSILLECTOMY     as a child   TUBAL LIGATION  1991    Social History Social History   Tobacco Use   Smoking status: Former    Current packs/day: 0.00    Types: Cigarettes    Quit date: 1995    Years since quitting: 30.9   Smokeless tobacco: Never  Vaping Use   Vaping status: Never Used  Substance Use Topics   Alcohol use: Never   Drug use: Never    Family History Family History  Problem Relation Age of Onset   Pulmonary embolism Mother    Early death Mother    Obesity Mother    Cancer Father        prostate   Heart disease Father    Hypertension Father    Alcohol abuse Father    COPD Father    Hearing loss Father    Cancer Sister        non-hodgkin's lymphoma   Hepatitis C Sister    Hypertension Sister    Cancer Sister    Early death Sister    Obesity Sister    COPD Sister     Hypertension Sister    Obesity Sister    Cancer Brother        colon   Diabetes Brother    Heart disease Brother    Hypertension Brother    Diabetes Brother    Cancer Brother        prostate   Cancer  Brother    Diabetes Brother    Early death Brother    Cancer Brother    Obesity Brother    Diabetes Brother    Breast cancer Paternal Aunt    Diabetes Daughter     No Known Allergies   REVIEW OF SYSTEMS (Negative unless checked)  Constitutional: [] Weight loss  [] Fever  [] Chills Cardiac: [] Chest pain   [] Chest pressure   [] Palpitations   [] Shortness of breath when laying flat   [] Shortness of breath with exertion. Vascular:  [x] Pain in legs with walking   [] Pain in legs at rest  [] History of DVT   [] Phlebitis   [] Swelling in legs   [] Varicose veins   [] Non-healing ulcers Pulmonary:   [] Uses home oxygen   [] Productive cough   [] Hemoptysis   [] Wheeze  [] COPD   [] Asthma Neurologic:  [] Dizziness   [] Seizures   [] History of stroke   [] History of TIA  [] Aphasia   [] Vissual changes   [] Weakness or numbness in arm   [] Weakness or numbness in leg Musculoskeletal:   [] Joint swelling   [] Joint pain   [] Low back pain Hematologic:  [] Easy bruising  [] Easy bleeding   [] Hypercoagulable state   [] Anemic Gastrointestinal:  [] Diarrhea   [] Vomiting  [] Gastroesophageal reflux/heartburn   [] Difficulty swallowing. Genitourinary:  [] Chronic kidney disease   [] Difficult urination  [] Frequent urination   [] Blood in urine Skin:  [] Rashes   [] Ulcers  Psychological:  [] History of anxiety   []  History of major depression.  Physical Examination  Vitals:   11/02/24 0717  BP: 100/65  Pulse: 69  Resp: 12  Temp: 98.2 F (36.8 C)  TempSrc: Oral  SpO2: 95%  Weight: 88.9 kg  Height: 5' 1 (1.549 m)   Body mass index is 37.03 kg/m. Gen: WD/WN, NAD Head: [place]/AT, No temporalis wasting.  Ear/Nose/Throat: Hearing grossly intact, nares w/o erythema or drainage Eyes: PER, EOMI, sclera nonicteric.  Neck:  Supple, no masses.  No bruit or JVD.  Pulmonary:  Good air movement, no audible wheezing, no use of accessory muscles.  Cardiac: RRR, normal S1, S2, no Murmurs. Vascular:  mild trophic changes, no open wounds Vessel Right Left  Radial Palpable Palpable  Gastrointestinal: soft, non-distended. No guarding/no peritoneal signs.  Musculoskeletal: M/S 5/5 throughout.  No visible deformity.  Neurologic: CN 2-12 intact. Pain and light touch intact in extremities.  Symmetrical.  Speech is fluent. Motor exam as listed above. Psychiatric: Judgment intact, Mood & affect appropriate for pt's clinical situation. Dermatologic: No rashes or ulcers noted.  No changes consistent with cellulitis.   CBC Lab Results  Component Value Date   WBC 5.2 10/27/2024   HGB 13.1 10/27/2024   HCT 40.1 10/27/2024   MCV 87.9 10/27/2024   PLT 198 10/27/2024    BMET    Component Value Date/Time   NA 140 10/27/2024 1404   NA 141 01/29/2024 1419   K 3.8 10/27/2024 1404   CL 100 10/27/2024 1404   CO2 28 10/27/2024 1404   GLUCOSE 103 (H) 10/27/2024 1404   BUN 11 10/27/2024 1404   BUN 7 (L) 01/29/2024 1419   CREATININE 0.78 10/27/2024 1404   CALCIUM  9.8 10/27/2024 1404   GFRNONAA >60 10/27/2024 1404   GFRAA 106 10/14/2018 0942   Estimated Creatinine Clearance: 73.9 mL/min (by C-G formula based on SCr of 0.78 mg/dL).  COAG Lab Results  Component Value Date   INR 1.1 01/20/2024   INR 1.0 01/16/2024    Radiology No results found.   Assessment/Plan 1.  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  And therefore should undergo repair. Patient is status post CT scan of the abdominal aorta and iliac artery aneurysm.  The initial scan in February had an iliac artery aneurysm measured at 1.8 cm that her most recent was an aneurysm measured at 2.6 cm.  Base on evaluation of the CT scan by me the  patient is a candidate for endovascular repair.  Additionally, the patient will require embolization of the right internal iliac artery to gain control of her aneurysmal growth and prevent endoleak.     The patient will require cardiac clearance prior to stent graft placement.    The patient will continue antiplatelet therapy as prescribed (since the patient is undergoing endovascular repair as opposed to open repair) as well as aggressive management of hyperlipidemia. Exercise is encouraged.    The patient is reminded that lifetime routine surveillance is a necessity with an endograft.    The risks and benefits of AAA repair are reviewed with the patient.  All questions are answered.  Alternative therapies are also discussed.  The patient agrees to proceed with endovascular aneurysm repair to prevent lethal rupture.   Patient will follow-up with me in the office after the surgery.   2. Essential hypertension Continue antihypertensive medications as already ordered, these medications have been reviewed and there are no changes at this time.   3. Tobacco dependence Smoking cessation was discussed, 3-10 minutes spent on this topic specifically   Cordella Shawl, MD  11/02/2024 7:31 AM

## 2024-11-02 NOTE — Transfer of Care (Signed)
 Immediate Anesthesia Transfer of Care Note  Patient: Erica Ruiz  Procedure(s) Performed: ENDOVASCULAR STENT GRAFT (AAA)  Patient Location: PACU  Anesthesia Type:General  Level of Consciousness: awake, alert , and oriented  Airway & Oxygen Therapy: Patient Spontanous Breathing  Post-op Assessment: Report given to RN and Post -op Vital signs reviewed and stable  Post vital signs: stable  Last Vitals:  Vitals Value Taken Time  BP    Temp    Pulse 74 11/02/24 10:07  Resp    SpO2 100 % 11/02/24 10:07  Vitals shown include unfiled device data.  Last Pain:  Vitals:   11/02/24 0717  TempSrc: Oral  PainSc: 0-No pain         Complications: No notable events documented.

## 2024-11-02 NOTE — Anesthesia Preprocedure Evaluation (Signed)
 Anesthesia Evaluation  Patient identified by MRN, date of birth, ID band Patient awake    Reviewed: Allergy & Precautions, NPO status , Patient's Chart, lab work & pertinent test results  History of Anesthesia Complications Negative for: history of anesthetic complications  Airway Mallampati: II   Neck ROM: Full    Dental  (+) Missing   Pulmonary Patient abstained from smoking., former smoker   Pulmonary exam normal breath sounds clear to auscultation       Cardiovascular hypertension, Normal cardiovascular exam Rhythm:Regular Rate:Normal  ECG 01/20/24:  Sinus rhythm Low voltage, precordial leads   Neuro/Psych  PSYCHIATRIC DISORDERS      Chronic pain    GI/Hepatic ,GERD  ,,  Endo/Other  diabetes, Type 2  Obesity   Renal/GU negative Renal ROS     Musculoskeletal  (+)  Fibromyalgia -, narcotic dependent  Abdominal   Peds  Hematology  (+) Blood dyscrasia, anemia   Anesthesia Other Findings   Reproductive/Obstetrics                              Anesthesia Physical Anesthesia Plan  ASA: 3  Anesthesia Plan: General   Post-op Pain Management:    Induction: Intravenous  PONV Risk Score and Plan: 2 and Treatment may vary due to age or medical condition  Airway Management Planned:   Additional Equipment:   Intra-op Plan:   Post-operative Plan:   Informed Consent: I have reviewed the patients History and Physical, chart, labs and discussed the procedure including the risks, benefits and alternatives for the proposed anesthesia with the patient or authorized representative who has indicated his/her understanding and acceptance.       Plan Discussed with: CRNA  Anesthesia Plan Comments:          Anesthesia Quick Evaluation

## 2024-11-02 NOTE — Progress Notes (Signed)
 Patient doing well, A&O x4, VSS. Bilateral femoral vascular sites at level zero upon assessment. Per Dr. Marea patient is to be flat in bed, and NPO for 2 hours beginning at 1000 11/19

## 2024-11-03 DIAGNOSIS — I723 Aneurysm of iliac artery: Principal | ICD-10-CM

## 2024-11-03 DIAGNOSIS — Z9889 Other specified postprocedural states: Secondary | ICD-10-CM

## 2024-11-03 DIAGNOSIS — Z95828 Presence of other vascular implants and grafts: Secondary | ICD-10-CM

## 2024-11-03 LAB — BASIC METABOLIC PANEL WITH GFR
Anion gap: 7 (ref 5–15)
BUN: 8 mg/dL (ref 8–23)
CO2: 28 mmol/L (ref 22–32)
Calcium: 8.7 mg/dL — ABNORMAL LOW (ref 8.9–10.3)
Chloride: 107 mmol/L (ref 98–111)
Creatinine, Ser: 0.58 mg/dL (ref 0.44–1.00)
GFR, Estimated: >60 mL/min (ref >60)
Glucose, Bld: 108 mg/dL — ABNORMAL HIGH (ref 70–99)
Potassium: 3.5 mmol/L (ref 3.5–5.1)
Sodium: 142 mmol/L (ref 135–145)

## 2024-11-03 LAB — CBC
HCT: 31.8 % — ABNORMAL LOW (ref 36.0–46.0)
Hemoglobin: 10.6 g/dL — ABNORMAL LOW (ref 12.0–15.0)
MCH: 29.1 pg (ref 26.0–34.0)
MCHC: 33.3 g/dL (ref 30.0–36.0)
MCV: 87.4 fL (ref 80.0–100.0)
Platelets: 147 K/uL — ABNORMAL LOW (ref 150–400)
RBC: 3.64 MIL/uL — ABNORMAL LOW (ref 3.87–5.11)
RDW: 12.7 % (ref 11.5–15.5)
WBC: 7 K/uL (ref 4.0–10.5)
nRBC: 0 % (ref 0.0–0.2)

## 2024-11-03 LAB — GLUCOSE, CAPILLARY: Glucose-Capillary: 100 mg/dL — ABNORMAL HIGH (ref 70–99)

## 2024-11-03 MED ORDER — POTASSIUM CHLORIDE CRYS ER 20 MEQ PO TBCR
40.0000 meq | EXTENDED_RELEASE_TABLET | Freq: Once | ORAL | Status: AC
  Administered 2024-11-03: 40 meq via ORAL
  Filled 2024-11-03: qty 2

## 2024-11-03 MED ORDER — CLOPIDOGREL BISULFATE 75 MG PO TABS
75.0000 mg | ORAL_TABLET | Freq: Every day | ORAL | 6 refills | Status: AC
Start: 2024-11-04 — End: ?

## 2024-11-03 MED ORDER — ASPIRIN 81 MG PO TBEC: 81.0000 mg | DELAYED_RELEASE_TABLET | Freq: Every day | ORAL | 12 refills | Status: AC

## 2024-11-03 NOTE — Discharge Summary (Signed)
 [facility]    Discharge Summary    Patient ID:  Erica Ruiz MRN: [id number] DOB/AGE: 1961/10/23 63 y.o.  Admit date: 11/02/2024 Discharge date: 11/03/2024 Date of Surgery: 11/02/2024 Surgeon: Surgeon(s): Dew, Selinda RAMAN, MD Schnier, Cordella MATSU, MD  Admission Diagnosis: Iliac artery aneurysm [I72.3] Aneurysm of right common iliac artery [I72.3]  Discharge Diagnoses:  Iliac artery aneurysm [I72.3] Aneurysm of right common iliac artery [I72.3]  Secondary Diagnoses: 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 y.o. female who presents with presents with a right iliac artery aneurysm that is greater than 3 cm placing her at risk for lethal rupture.  The anatomy is suitable for endovascular repair.    Hospital Course:  Erica Ruiz is a 63 y.o. female is S/P Right Iliac Aneurysm repair   PROCEDURE: US  guidance for vascular access, bilateral femoral arteries Catheter placement into aorta from bilateral femoral approaches Placement of a right 16 x 12 Gore Excluder Endoprosthesis iliac limb with a 16 x 10 iliac extender ipsilateral limb for  seal ProGlide closure devices right common femoral artery with a StarClose device left. Coil embolization of the right internal iliac artery with 3 penumbra Ruby coils   PRE-OPERATIVE DIAGNOSIS: Right common iliac artery aneurysm   POST-OPERATIVE DIAGNOSIS: same   SURGEON: Cordella Shawl, MD and Selinda Gu, MD - Co-surgeons   ANESTHESIA: general   ESTIMATED BLOOD LOSS: 50 cc   FINDING(S): 1.  Large right common iliac artery aneurysm  Patient is resting comfortably in bed this morning.  No complications overnight.  Vitals are remained stable.  Patient is ambulating well, urinating well and eating well.  No nausea vomiting diarrhea.  No dizziness or headaches.  Patient's bilateral lower extremities are warm to touch with palpable pulses.  Patient is acceptable for discharge later today.  Patient is being discharged on aspirin 81 mg daily, Plavix 75 mg daily and Lipitor 20 mg daily.  Patient was instructed not to skip or miss taking any of these medications as it will interfere with the outcome of her procedure.  I spent greater than 60 minutes in developing, implementing, teaching and discharging this patient today.     Extubated: POD # 0 Physical Exam:  Alert notes x3, no acute distress Face: Symmetrical.  Tongue is midline. Neck: Trachea is midline.  No swelling or bruising. Cardiovascular: Regular rate and rhythm Pulmonary: Clear to auscultation bilaterally Abdomen: Soft, nontender, nondistended Right groin access: Clean dry and intact.  No swelling or drainage noted  Left groin access: Clean dry and intact.  No swelling or drainage noted Left lower extremity: Thigh soft.  Calf soft.  Extremities warm distally toes.  Hard to palpate pedal pulses however the foot is warm is her good capillary refill. Right lower extremity: Thigh soft.  Calf soft.  Extremities warm distally toes.  Hard to palpate pedal pulses however the foot is warm is her good capillary refill. Neurological: No  deficits noted   Post-op wounds:  clean, dry, intact or healing well  Pt. Ambulating, voiding and taking PO diet without difficulty. Pt pain controlled with PO pain meds.  Labs:  As below  Complications: none  Consults:    Significant Diagnostic Studies: CBC Lab Results  Component Value Date   WBC 7.0 11/03/2024   HGB 10.6 (L) 11/03/2024   HCT 31.8 (L) 11/03/2024   MCV 87.4 11/03/2024   PLT 147 (L) 11/03/2024    BMET    Component Value Date/Time   NA 142 11/03/2024 0507   NA 141 01/29/2024 1419   K 3.5 11/03/2024 0507   CL 107 11/03/2024 0507   CO2 28 11/03/2024 0507   GLUCOSE 108 (H) 11/03/2024 0507   BUN 8 11/03/2024 0507   BUN 7 (L) 01/29/2024 1419   CREATININE 0.58 11/03/2024 0507   CALCIUM  8.7 (L) 11/03/2024 0507   GFRNONAA >60 11/03/2024 0507   GFRAA 106 10/14/2018 0942   COAG Lab Results  Component Value Date   INR 1.1 01/20/2024   INR 1.0 01/16/2024     Disposition:  Discharge to :Home  Allergies as of 11/03/2024   No Known Allergies      Medication List     TAKE these medications    aspirin EC 81 MG tablet Take 1 tablet (81 mg total) by mouth daily at 6 (six) AM. Swallow whole. Start taking on: November 04, 2024   atorvastatin  20 MG tablet Commonly known as: LIPITOR Take 20 mg by mouth daily.   clopidogrel 75 MG tablet Commonly known as: PLAVIX Take 1 tablet (75 mg total) by mouth daily. Start taking on: November 04, 2024   DULoxetine  30 MG capsule Commonly known as: CYMBALTA  Take 30 mg by mouth daily.   fluticasone  50 MCG/ACT nasal spray Commonly known as: FLONASE  Place 2 sprays into both nostrils daily.   gabapentin  600 MG tablet Commonly known as: NEURONTIN  Take 600 mg by mouth 2 (two) times daily.   glucose blood test strip 1 each by Other route 2 (two) times daily.   hydrochlorothiazide  25 MG tablet Commonly known as: HYDRODIURIL  Take 1 tablet (25 mg total) by mouth daily.   lisinopril  10 MG  tablet Commonly known as: ZESTRIL  Take 1 tablet (10 mg total) by mouth daily.   morphine  15 MG 12 hr tablet Commonly known as: MS CONTIN  Take 15 mg by mouth every 8 (eight) hours as needed for pain.   Narcan  4 MG/0.1ML Liqd nasal spray kit Generic drug: naloxone  Place 1 spray into the nose once.   onetouch ultrasoft lancets 1 each by Other route 2 (two) times daily.   Oxycodone  HCl 10 MG Tabs Take 10 mg by mouth 4 (four) times daily as needed. Pain clinic   pantoprazole  40 MG tablet Commonly known as: Protonix  Take 1 tablet (40 mg total) by mouth 2 (two) times daily.   PROBIOTIC DAILY PO Take 1 tablet by mouth daily.   Synjardy XR 12.04-999 MG Tb24 Generic drug: Empagliflozin -metFORMIN  HCl ER Take 2 tablets by mouth daily. Cherilyn  tiZANidine  4 MG tablet Commonly known as: ZANAFLEX  Take 4 mg by mouth every 8 (eight) hours as needed for muscle spasms.       Verbal and written Discharge instructions given to the patient. Wound care per Discharge AVS  Follow-up Information     Schnier, Cordella MATSU, MD Follow up in 4 week(s).   Specialties: Vascular Surgery, Cardiology, Radiology, Vascular Surgery Why: EVAR Contact information: [address] [place] [address] [phone]                 Signed: Gwendlyn JONELLE Shank, NP  11/03/2024, 9:51 AM

## 2024-11-03 NOTE — Discharge Instructions (Addendum)
 Vascular surgery discharge instructions:  Do not lift anything heavy for the next 2 weeks.  Do not lift anything more than a gallon of milk.  Do not drive for the next week.  Do not drive if you are taking narcotic medication.  You may shower when you get home.  Shower with the old dressing in place and remove immediately after showering.  Pat the area completely dry and place a Band-Aid over the puncture site for the next 3 days.  You are being discharged on aspirin  81 mg daily, Plavix  75 mg daily and Lipitor 20 mg daily.  Do not miss or skip taking any of his medications as it will interfere with the outcome of your procedure.  Follow-up with vein and vascular surgery as scheduled.

## 2024-11-03 NOTE — Progress Notes (Signed)
 [facility]  Daily Progress Note   Subjective  -   Patient feeling well this morning.  Mild incisional soreness.  No major events overnight.  Objective Vitals:   11/03/24 0618 11/03/24 0619 11/03/24 0700 11/03/24 0729  BP: (!) 88/53 94/63 97/62    Pulse: 64 64 66 61  Resp: 14 17 13 11   Temp:    97.6 F (36.4 C)  TempSrc:    Axillary  SpO2: 95% 95% 94% 96%  Weight:      Height:        Intake/Output Summary (Last 24 hours) at 11/03/2024 0827 Last data filed at 11/03/2024 0735 Gross per 24 hour  Intake 1562.29 ml  Output 2770 ml  Net -1207.71 ml    PULM  CTAB CV  RRR VASC  access sites are clean, dry, and intact without any evidence of bleeding.  Laboratory CBC    Component Value Date/Time   WBC 7.0 11/03/2024 0507   HGB 10.6 (L) 11/03/2024 0507   HGB 10.3 (L) 01/29/2024 1419   HCT 31.8 (L) 11/03/2024 0507   HCT 32.8 (L) 01/29/2024 1419   PLT 147 (L) 11/03/2024 0507   PLT 334 01/29/2024 1419    BMET    Component Value Date/Time   NA 142 11/03/2024 0507   NA 141 01/29/2024 1419   K 3.5 11/03/2024 0507   CL 107 11/03/2024 0507   CO2 28 11/03/2024 0507   GLUCOSE 108 (H) 11/03/2024 0507   BUN 8 11/03/2024 0507   BUN 7 (L) 01/29/2024 1419   CREATININE 0.58 11/03/2024 0507   CALCIUM  8.7 (L) 11/03/2024 0507   GFRNONAA >60 11/03/2024 0507   GFRAA 106 10/14/2018 0942    Assessment/Planning: POD #1 s/p right iliac artery aneurysm repair with stent graft and coil embolization of the right internal iliac artery  Doing quite well. Remove Foley catheter, ambulate, and give regular diet this morning. Likely home later today Follow-up in the office in 3 to 4 weeks with aortic duplex  Selinda Gu  11/03/2024, 8:27 AM

## 2024-11-04 ENCOUNTER — Telehealth: Payer: Self-pay | Admitting: *Deleted

## 2024-11-04 ENCOUNTER — Encounter: Payer: Self-pay | Admitting: Vascular Surgery

## 2024-11-04 NOTE — Transitions of Care (Post Inpatient/ED Visit) (Signed)
 11/04/2024  Name: DESHAWN SKELLEY MRN: [id number] DOB: [birth year]  Today's TOC FU Call Status: Today's TOC FU Call Status:: Successful TOC FU Call Completed TOC FU Call Complete Date: 11/04/24  Patient's Name and Date of Birth confirmed. 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  I do not need follow up calls) Any questions or concerns?: No  Items Reviewed: Did you receive and understand the discharge instructions provided?: Yes (thoroughly reviewed with patient who verbalizes excellent understanding of same) Medications obtained,verified, and reconciled?: Yes (Medications Reviewed) (Full medication reconciliation/ review completed; no concerns or discrepancies identified; confirmed patient obtained/ is taking all newly Rx'd medications as instructed; self-manages medications and denies questions/ concerns around medications today) Any new allergies since your discharge?: No Dietary orders reviewed?: Yes Type of Diet Ordered:: Regular diet- I watch what I eat and try to limit carbs and sugars Do you have support at home?: Yes People in Home [RPT]: spouse, child(ren), adult Name of Support/Comfort Primary Source: Reports independent in self-care activities; resides with supportive spouse and adult daughter- assists as/ if needed/ indicated  Medications Reviewed Today: Medications Reviewed Today     Reviewed by Nella Botsford M, RN  (Registered Nurse) on 11/04/24 at 1026  Med List Status: <None>   Medication Order Taking? Sig Documenting Provider Last Dose Status Informant  aspirin  EC 81 MG tablet [id number] Yes Take 1 tablet (81 mg total) by mouth daily at 6 (six) AM. Swallow whole. Elisabeth Round R, NP  Active   atorvastatin  (LIPITOR) 20 MG tablet [id number] Yes Take 20 mg by mouth daily. [provider]  Active Self, Pharmacy Records  clopidogrel  (PLAVIX ) 75 MG tablet [id number] Yes Take 1 tablet (75 mg total) by mouth daily. Elisabeth Round R, NP  Active   DULoxetine  (CYMBALTA ) 30 MG capsule [id number] Yes Take 30 mg by mouth daily. [provider]  Active Self, Pharmacy Records  fluticasone  (FLONASE ) 50 MCG/ACT nasal spray [id number] Yes Place 2 sprays into both nostrils daily. Lavell Bari LABOR, FNP  Active Self, Pharmacy Records           Med Note [facility], MELISSA A   Sat Jan 16, 2024  6:18 PM)    gabapentin  (NEURONTIN ) 600 MG tablet [id number] Yes Take 600 mg by mouth 2 (two) times daily. [provider]  Active Self, Pharmacy Records  glucose blood test strip [id number] Yes 1 each by Other route 2 (two) times daily. Joshua Cathryne BROCKS, MD  Active Self, Pharmacy Records           Med Note VASHTI MARGARIE JONETTA Pablo Aug 06, 2020  3:37 PM) One touch  hydrochlorothiazide  (HYDRODIURIL ) 25 MG tablet [id number] Yes Take 1 tablet (25 mg total) by mouth daily. Lemon Raisin, MD  Active   Lancets [facility] ULTRASOFT) lancets [id number] Yes 1 each by Other route 2 (two) times daily. Joshua Cathryne BROCKS, MD  Active Self, Pharmacy Records  lisinopril  (ZESTRIL ) 10 MG tablet [id number] Yes Take 1 tablet (  10 mg total) by mouth daily. Lemon Raisin, MD  Active   morphine  (MS CONTIN ) 15 MG 12 hr tablet [id number] Yes Take 15 mg by mouth every 8 (eight) hours as needed for pain. [provider]  Active Self, Pharmacy Records  NARCAN  4 MG/0.1ML LIQD nasal spray kit [id number] Yes Place 1 spray into the nose once. [provider]  Active Self, Pharmacy Records  Oxycodone  HCl 10 MG TABS [id number] Yes Take 10 mg by mouth 4 (four) times daily as needed. Pain clinic [provider]  Active Self, Pharmacy Records  pantoprazole  (PROTONIX ) 40 MG tablet [id number] Yes Take 1 tablet (40 mg total) by mouth 2 (two) times daily. Barbarann Nest, MD  Active   Probiotic Product (PROBIOTIC DAILY PO) [id number] Yes Take 1 tablet by mouth daily. [provider]  Active Self, Pharmacy Records  SYNJARDY XR 12.04-999 MG TB24 [id number] Yes Take 2 tablets by mouth daily. Cherilyn [provider]  Active Self, Pharmacy Records  tiZANidine  (ZANAFLEX ) 4 MG tablet [id number] Yes Take 4 mg by mouth every 8 (eight) hours as needed for muscle spasms. [provider]  Active Self, Pharmacy Records           Home Care and Equipment/Supplies: Were Home Health Services Ordered?: No Any new equipment or medical supplies ordered?: No  Functional Questionnaire: Do you need assistance with bathing/showering or dressing?: No Do you need assistance with meal preparation?: No Do you need assistance with eating?: No Do you have difficulty maintaining continence: No Do you need assistance with getting out of bed/getting out of a chair/moving?: No Do you have difficulty managing or taking your medications?: No  Follow up appointments reviewed: PCP Follow-up appointment confirmed?: No (Declined care coordination outreach in real-time with scheduling care guide to schedule hospital follow up PCP appointment: the surgeon said I only need to follow up with him, no need to see my PCP) MD Provider Line [phone] Given: No (verified well-established with current PCP) Specialist Hospital Follow-up appointment confirmed?: No (Verified patient aware that recommended time frame for follow up with surgeon is in 4 weeks- she verbalizes awareness/ understanding and confirms has number for surgical  provider) Reason Specialist Follow-Up Not Confirmed: Patient has Specialist Provider Number and will Call for Appointment Do you need transportation to your follow-up appointment?: No Do you understand care options if your condition(s) worsen?: Yes-patient verbalized understanding  SDOH Interventions Today    Flowsheet Row Most Recent Value  SDOH Interventions   Food Insecurity Interventions Intervention Not Indicated  Housing Interventions Intervention Not Indicated  Transportation Interventions Intervention Not Indicated  [drives self]  Utilities Interventions Intervention Not Indicated   See TOC assessment tabs for additional assessment/ TOC intervention information Provided education around common side effects of narcotic pain medicine; need to use pain medicine as prescribed/ as needed; safe use of opioid medication; strategies to prevent constipation Reinforced signs/ symptoms wound infection along with corresponding action plan  Reviewed most recent A1C value from 11/02/24: 6.3; reviewed historical trends over time with patient who verbalizes good understanding of same; verbalizes good understanding of significance of A1-C and general A1-C goals Reinforced blood sugar management at home; need to follow carbohydrate modified diet; continue home blood sugar monitoring; take oral medication as prescribed  Patient declines need for ongoing/ further care management/ coordination outreach; declines enrollment in 30-day TOC program- declines taking my direct phone number should needs/ concerns arise post-TOC call   Pls call/ message for questions,  Ayani Ospina Mckinney Cimberly Stoffel,  RN, BSN, Media Planner  Transitions of Care  [facility] Population Health  [place] [phone]: direct office

## 2024-11-07 LAB — BPAM RBC
Blood Product Expiration Date: 202512042359
Blood Product Expiration Date: 202512062359
Unit Type and Rh: 6200
Unit Type and Rh: 6200

## 2024-12-01 DIAGNOSIS — M5116 Intervertebral disc disorders with radiculopathy, lumbar region: Secondary | ICD-10-CM | POA: Diagnosis not present

## 2024-12-01 DIAGNOSIS — Z79899 Other long term (current) drug therapy: Secondary | ICD-10-CM | POA: Diagnosis not present

## 2024-12-01 DIAGNOSIS — M25562 Pain in left knee: Secondary | ICD-10-CM | POA: Diagnosis not present

## 2024-12-01 DIAGNOSIS — M4716 Other spondylosis with myelopathy, lumbar region: Secondary | ICD-10-CM | POA: Diagnosis not present

## 2024-12-01 DIAGNOSIS — G894 Chronic pain syndrome: Secondary | ICD-10-CM | POA: Diagnosis not present

## 2024-12-01 DIAGNOSIS — M25561 Pain in right knee: Secondary | ICD-10-CM | POA: Diagnosis not present

## 2024-12-26 DIAGNOSIS — E1159 Type 2 diabetes mellitus with other circulatory complications: Secondary | ICD-10-CM | POA: Diagnosis not present

## 2024-12-26 DIAGNOSIS — I152 Hypertension secondary to endocrine disorders: Secondary | ICD-10-CM | POA: Diagnosis not present

## 2024-12-26 DIAGNOSIS — E785 Hyperlipidemia, unspecified: Secondary | ICD-10-CM | POA: Diagnosis not present

## 2024-12-26 DIAGNOSIS — R809 Proteinuria, unspecified: Secondary | ICD-10-CM | POA: Diagnosis not present

## 2024-12-26 DIAGNOSIS — E1129 Type 2 diabetes mellitus with other diabetic kidney complication: Secondary | ICD-10-CM | POA: Diagnosis not present

## 2024-12-26 DIAGNOSIS — E1169 Type 2 diabetes mellitus with other specified complication: Secondary | ICD-10-CM | POA: Diagnosis not present

## 2025-01-11 LAB — TYPE AND SCREEN
ABO/RH(D): A POS
Antibody Screen: POSITIVE
DAT, IgG: POSITIVE
Unit division: 0
Unit division: 0

## 2025-03-06 ENCOUNTER — Other Ambulatory Visit: Payer: Self-pay | Admitting: Student

## 2025-03-06 DIAGNOSIS — Z1231 Encounter for screening mammogram for malignant neoplasm of breast: Secondary | ICD-10-CM

## 2025-04-05 ENCOUNTER — Ambulatory Visit

## 2025-05-05 ENCOUNTER — Encounter: Admitting: Student
